# Patient Record
Sex: Female | Born: 1945 | Race: White | Hispanic: No | Marital: Married | Smoking: Current every day smoker
Health system: Southern US, Community
[De-identification: ages and names within clinical notes are randomized; demographics above are authoritative.]

## PROBLEM LIST (undated history)

## (undated) DIAGNOSIS — E785 Hyperlipidemia, unspecified: Secondary | ICD-10-CM

## (undated) DIAGNOSIS — I4891 Unspecified atrial fibrillation: Secondary | ICD-10-CM

## (undated) DIAGNOSIS — G8929 Other chronic pain: Secondary | ICD-10-CM

## (undated) DIAGNOSIS — M419 Scoliosis, unspecified: Secondary | ICD-10-CM

## (undated) DIAGNOSIS — F419 Anxiety disorder, unspecified: Secondary | ICD-10-CM

## (undated) DIAGNOSIS — M1990 Unspecified osteoarthritis, unspecified site: Secondary | ICD-10-CM

## (undated) HISTORY — DX: Anxiety disorder, unspecified: F41.9

## (undated) HISTORY — DX: Unspecified atrial fibrillation: I48.91

## (undated) HISTORY — PX: OTHER SURGICAL HISTORY: SHX169

## (undated) HISTORY — DX: Other chronic pain: G89.29

## (undated) HISTORY — DX: Unspecified osteoarthritis, unspecified site: M19.90

## (undated) HISTORY — DX: Hyperlipidemia, unspecified: E78.5

---

## 1998-04-06 ENCOUNTER — Ambulatory Visit (HOSPITAL_COMMUNITY): Admission: RE | Admit: 1998-04-06 | Discharge: 1998-04-06 | Payer: Self-pay | Admitting: Surgery

## 1998-04-13 ENCOUNTER — Emergency Department (HOSPITAL_COMMUNITY): Admission: EM | Admit: 1998-04-13 | Discharge: 1998-04-13 | Payer: Self-pay | Admitting: Emergency Medicine

## 1999-05-18 ENCOUNTER — Emergency Department (HOSPITAL_COMMUNITY): Admission: EM | Admit: 1999-05-18 | Discharge: 1999-05-18 | Payer: Self-pay | Admitting: Emergency Medicine

## 2000-02-10 ENCOUNTER — Other Ambulatory Visit: Admission: RE | Admit: 2000-02-10 | Discharge: 2000-02-10 | Payer: Self-pay | Admitting: Family Medicine

## 2001-10-25 ENCOUNTER — Other Ambulatory Visit: Admission: RE | Admit: 2001-10-25 | Discharge: 2001-10-25 | Payer: Self-pay | Admitting: Family Medicine

## 2004-04-01 ENCOUNTER — Ambulatory Visit: Payer: Self-pay | Admitting: Family Medicine

## 2004-06-10 ENCOUNTER — Ambulatory Visit: Payer: Self-pay | Admitting: Family Medicine

## 2004-06-10 ENCOUNTER — Other Ambulatory Visit: Admission: RE | Admit: 2004-06-10 | Discharge: 2004-06-10 | Payer: Self-pay | Admitting: Family Medicine

## 2004-08-20 ENCOUNTER — Ambulatory Visit: Payer: Self-pay | Admitting: Family Medicine

## 2004-09-16 ENCOUNTER — Ambulatory Visit: Payer: Self-pay | Admitting: Family Medicine

## 2004-11-12 ENCOUNTER — Ambulatory Visit: Payer: Self-pay | Admitting: Family Medicine

## 2005-01-10 ENCOUNTER — Ambulatory Visit: Payer: Self-pay | Admitting: Family Medicine

## 2005-04-01 ENCOUNTER — Ambulatory Visit: Payer: Self-pay | Admitting: Family Medicine

## 2005-04-08 ENCOUNTER — Ambulatory Visit: Payer: Self-pay | Admitting: Family Medicine

## 2005-05-19 ENCOUNTER — Ambulatory Visit: Payer: Self-pay | Admitting: Family Medicine

## 2005-06-23 ENCOUNTER — Ambulatory Visit: Payer: Self-pay | Admitting: Family Medicine

## 2005-08-12 ENCOUNTER — Ambulatory Visit: Payer: Self-pay | Admitting: Family Medicine

## 2005-09-08 ENCOUNTER — Ambulatory Visit: Payer: Self-pay | Admitting: Family Medicine

## 2005-10-20 ENCOUNTER — Ambulatory Visit: Payer: Self-pay | Admitting: Family Medicine

## 2005-12-15 ENCOUNTER — Ambulatory Visit: Payer: Self-pay | Admitting: Family Medicine

## 2006-04-01 DIAGNOSIS — F410 Panic disorder [episodic paroxysmal anxiety] without agoraphobia: Secondary | ICD-10-CM

## 2006-04-01 DIAGNOSIS — E669 Obesity, unspecified: Secondary | ICD-10-CM | POA: Insufficient documentation

## 2006-04-06 ENCOUNTER — Ambulatory Visit: Payer: Self-pay | Admitting: Family Medicine

## 2006-04-06 LAB — CONVERTED CEMR LAB
ALT: 23 units/L (ref 0–40)
AST: 20 units/L (ref 0–37)
Basophils Relative: 0.9 % (ref 0.0–1.0)
Bilirubin, Direct: 0.1 mg/dL (ref 0.0–0.3)
Calcium: 9.2 mg/dL (ref 8.4–10.5)
Chloride: 106 meq/L (ref 96–112)
Cholesterol: 227 mg/dL (ref 0–200)
Creatinine, Ser: 0.8 mg/dL (ref 0.4–1.2)
Direct LDL: 170.8 mg/dL
Eosinophils Relative: 4.4 % (ref 0.0–5.0)
GFR calc non Af Amer: 78 mL/min
Glucose, Bld: 92 mg/dL (ref 70–99)
HCT: 43.5 % (ref 36.0–46.0)
Hemoglobin: 15.1 g/dL — ABNORMAL HIGH (ref 12.0–15.0)
Lymphocytes Relative: 34.1 % (ref 12.0–46.0)
Monocytes Absolute: 0.5 10*3/uL (ref 0.2–0.7)
Neutro Abs: 2.8 10*3/uL (ref 1.4–7.7)
Neutrophils Relative %: 51.5 % (ref 43.0–77.0)
RDW: 12.8 % (ref 11.5–14.6)
Sodium: 140 meq/L (ref 135–145)
Total Bilirubin: 0.5 mg/dL (ref 0.3–1.2)
Total CHOL/HDL Ratio: 5.4
VLDL: 24 mg/dL (ref 0–40)
WBC: 5.3 10*3/uL (ref 4.5–10.5)

## 2006-04-17 ENCOUNTER — Ambulatory Visit: Payer: Self-pay | Admitting: Family Medicine

## 2006-06-08 ENCOUNTER — Ambulatory Visit: Payer: Self-pay | Admitting: Family Medicine

## 2006-08-17 ENCOUNTER — Ambulatory Visit: Payer: Self-pay | Admitting: Family Medicine

## 2006-08-17 DIAGNOSIS — E785 Hyperlipidemia, unspecified: Secondary | ICD-10-CM

## 2006-08-17 DIAGNOSIS — M4125 Other idiopathic scoliosis, thoracolumbar region: Secondary | ICD-10-CM | POA: Insufficient documentation

## 2006-08-17 DIAGNOSIS — E538 Deficiency of other specified B group vitamins: Secondary | ICD-10-CM | POA: Insufficient documentation

## 2006-08-17 DIAGNOSIS — F411 Generalized anxiety disorder: Secondary | ICD-10-CM | POA: Insufficient documentation

## 2006-08-18 LAB — CONVERTED CEMR LAB
ALT: 29 units/L (ref 0–40)
Alkaline Phosphatase: 67 units/L (ref 39–117)
BUN: 13 mg/dL (ref 6–23)
Bilirubin, Direct: 0.1 mg/dL (ref 0.0–0.3)
CO2: 29 meq/L (ref 19–32)
Calcium: 8.9 mg/dL (ref 8.4–10.5)
Creatinine, Ser: 0.7 mg/dL (ref 0.4–1.2)
GFR calc Af Amer: 110 mL/min
Glucose, Bld: 97 mg/dL (ref 70–99)
Potassium: 4 meq/L (ref 3.5–5.1)
Total Bilirubin: 0.6 mg/dL (ref 0.3–1.2)
Total Protein: 6.7 g/dL (ref 6.0–8.3)

## 2006-08-19 ENCOUNTER — Encounter: Admission: RE | Admit: 2006-08-19 | Discharge: 2006-08-19 | Payer: Self-pay | Admitting: Family Medicine

## 2006-08-24 ENCOUNTER — Encounter: Payer: Self-pay | Admitting: Family Medicine

## 2006-08-24 ENCOUNTER — Encounter (INDEPENDENT_AMBULATORY_CARE_PROVIDER_SITE_OTHER): Payer: Self-pay | Admitting: *Deleted

## 2006-09-01 ENCOUNTER — Telehealth (INDEPENDENT_AMBULATORY_CARE_PROVIDER_SITE_OTHER): Payer: Self-pay | Admitting: *Deleted

## 2006-09-21 ENCOUNTER — Encounter: Payer: Self-pay | Admitting: Family Medicine

## 2006-09-27 ENCOUNTER — Encounter: Payer: Self-pay | Admitting: Family Medicine

## 2006-09-28 ENCOUNTER — Encounter (INDEPENDENT_AMBULATORY_CARE_PROVIDER_SITE_OTHER): Payer: Self-pay | Admitting: *Deleted

## 2006-10-04 ENCOUNTER — Encounter: Payer: Self-pay | Admitting: Family Medicine

## 2006-10-05 ENCOUNTER — Encounter (INDEPENDENT_AMBULATORY_CARE_PROVIDER_SITE_OTHER): Payer: Self-pay | Admitting: *Deleted

## 2006-10-22 ENCOUNTER — Telehealth: Payer: Self-pay | Admitting: Family Medicine

## 2006-11-23 ENCOUNTER — Encounter: Payer: Self-pay | Admitting: Family Medicine

## 2006-11-23 ENCOUNTER — Ambulatory Visit: Payer: Self-pay | Admitting: Family Medicine

## 2006-11-23 ENCOUNTER — Other Ambulatory Visit: Admission: RE | Admit: 2006-11-23 | Discharge: 2006-11-23 | Payer: Self-pay | Admitting: Family Medicine

## 2006-11-23 DIAGNOSIS — Z87891 Personal history of nicotine dependence: Secondary | ICD-10-CM

## 2006-11-26 ENCOUNTER — Encounter (INDEPENDENT_AMBULATORY_CARE_PROVIDER_SITE_OTHER): Payer: Self-pay | Admitting: *Deleted

## 2006-12-01 LAB — CONVERTED CEMR LAB
AST: 20 units/L (ref 0–37)
Albumin: 3.8 g/dL (ref 3.5–5.2)
Basophils Relative: 0.6 % (ref 0.0–1.0)
CO2: 29 meq/L (ref 19–32)
Chloride: 107 meq/L (ref 96–112)
Creatinine, Ser: 0.9 mg/dL (ref 0.4–1.2)
Eosinophils Absolute: 0.2 10*3/uL (ref 0.0–0.6)
Eosinophils Relative: 3.3 % (ref 0.0–5.0)
Glucose, Bld: 100 mg/dL — ABNORMAL HIGH (ref 70–99)
Hemoglobin: 14.8 g/dL (ref 12.0–15.0)
Lymphocytes Relative: 30.4 % (ref 12.0–46.0)
MCV: 90.2 fL (ref 78.0–100.0)
Monocytes Absolute: 0.7 10*3/uL (ref 0.2–0.7)
Neutro Abs: 4 10*3/uL (ref 1.4–7.7)
Neutrophils Relative %: 56.2 % (ref 43.0–77.0)
Sodium: 143 meq/L (ref 135–145)
TSH: 0.85 microintl units/mL (ref 0.35–5.50)
Total Bilirubin: 0.7 mg/dL (ref 0.3–1.2)
Total Protein: 7.1 g/dL (ref 6.0–8.3)
VLDL: 21 mg/dL (ref 0–40)
WBC: 7.1 10*3/uL (ref 4.5–10.5)

## 2007-04-05 ENCOUNTER — Ambulatory Visit: Payer: Self-pay | Admitting: Family Medicine

## 2007-05-24 ENCOUNTER — Ambulatory Visit: Payer: Self-pay | Admitting: Family Medicine

## 2007-05-28 LAB — CONVERTED CEMR LAB
ALT: 19 units/L (ref 0–35)
Alkaline Phosphatase: 63 units/L (ref 39–117)
BUN: 9 mg/dL (ref 6–23)
CO2: 29 meq/L (ref 19–32)
Calcium: 9 mg/dL (ref 8.4–10.5)
Chloride: 108 meq/L (ref 96–112)
GFR calc Af Amer: 94 mL/min
GFR calc non Af Amer: 78 mL/min
Total CHOL/HDL Ratio: 3.5
Total Protein: 7 g/dL (ref 6.0–8.3)

## 2007-06-07 ENCOUNTER — Encounter: Payer: Self-pay | Admitting: Family Medicine

## 2007-06-10 ENCOUNTER — Encounter: Admission: RE | Admit: 2007-06-10 | Discharge: 2007-06-10 | Payer: Self-pay | Admitting: Orthopaedic Surgery

## 2007-06-21 ENCOUNTER — Encounter: Admission: RE | Admit: 2007-06-21 | Discharge: 2007-06-21 | Payer: Self-pay | Admitting: Orthopaedic Surgery

## 2007-07-02 ENCOUNTER — Encounter: Admission: RE | Admit: 2007-07-02 | Discharge: 2007-07-02 | Payer: Self-pay | Admitting: Orthopaedic Surgery

## 2007-08-18 ENCOUNTER — Encounter: Payer: Self-pay | Admitting: Family Medicine

## 2007-09-28 ENCOUNTER — Ambulatory Visit: Payer: Self-pay | Admitting: Family Medicine

## 2007-09-28 DIAGNOSIS — M48061 Spinal stenosis, lumbar region without neurogenic claudication: Secondary | ICD-10-CM | POA: Insufficient documentation

## 2007-10-14 ENCOUNTER — Ambulatory Visit: Payer: Self-pay | Admitting: Family Medicine

## 2007-10-14 DIAGNOSIS — R609 Edema, unspecified: Secondary | ICD-10-CM

## 2007-10-14 DIAGNOSIS — R0609 Other forms of dyspnea: Secondary | ICD-10-CM | POA: Insufficient documentation

## 2007-10-18 ENCOUNTER — Telehealth (INDEPENDENT_AMBULATORY_CARE_PROVIDER_SITE_OTHER): Payer: Self-pay | Admitting: *Deleted

## 2007-10-26 LAB — CONVERTED CEMR LAB
Basophils Absolute: 0 10*3/uL (ref 0.0–0.1)
Bilirubin, Direct: 0.1 mg/dL (ref 0.0–0.3)
Calcium: 8.8 mg/dL (ref 8.4–10.5)
GFR calc Af Amer: 93 mL/min
HCT: 37.2 % (ref 36.0–46.0)
Hemoglobin: 12.9 g/dL (ref 12.0–15.0)
MCHC: 34.8 g/dL (ref 30.0–36.0)
Monocytes Absolute: 0.5 10*3/uL (ref 0.1–1.0)
Neutro Abs: 3 10*3/uL (ref 1.4–7.7)
RDW: 13.2 % (ref 11.5–14.6)
Sodium: 141 meq/L (ref 135–145)
Total Bilirubin: 0.5 mg/dL (ref 0.3–1.2)

## 2007-10-27 ENCOUNTER — Encounter (INDEPENDENT_AMBULATORY_CARE_PROVIDER_SITE_OTHER): Payer: Self-pay | Admitting: *Deleted

## 2007-10-28 ENCOUNTER — Telehealth (INDEPENDENT_AMBULATORY_CARE_PROVIDER_SITE_OTHER): Payer: Self-pay | Admitting: *Deleted

## 2007-11-01 ENCOUNTER — Encounter: Payer: Self-pay | Admitting: Family Medicine

## 2007-11-05 ENCOUNTER — Telehealth (INDEPENDENT_AMBULATORY_CARE_PROVIDER_SITE_OTHER): Payer: Self-pay | Admitting: *Deleted

## 2008-02-07 ENCOUNTER — Other Ambulatory Visit: Admission: RE | Admit: 2008-02-07 | Discharge: 2008-02-07 | Payer: Self-pay | Admitting: Family Medicine

## 2008-02-07 ENCOUNTER — Encounter: Payer: Self-pay | Admitting: Family Medicine

## 2008-02-07 ENCOUNTER — Ambulatory Visit: Payer: Self-pay | Admitting: Family Medicine

## 2008-02-07 DIAGNOSIS — I10 Essential (primary) hypertension: Secondary | ICD-10-CM | POA: Insufficient documentation

## 2008-02-08 ENCOUNTER — Encounter: Payer: Self-pay | Admitting: Family Medicine

## 2008-02-14 ENCOUNTER — Encounter (INDEPENDENT_AMBULATORY_CARE_PROVIDER_SITE_OTHER): Payer: Self-pay | Admitting: *Deleted

## 2008-02-21 LAB — CONVERTED CEMR LAB
ALT: 15 units/L (ref 0–35)
Bilirubin, Direct: 0.1 mg/dL (ref 0.0–0.3)
CO2: 28 meq/L (ref 19–32)
Calcium: 9.3 mg/dL (ref 8.4–10.5)
Eosinophils Absolute: 0.2 10*3/uL (ref 0.0–0.7)
Eosinophils Relative: 2.5 % (ref 0.0–5.0)
Folate: 13 ng/mL
GFR calc Af Amer: 109 mL/min
GFR calc non Af Amer: 90 mL/min
HDL: 40.4 mg/dL (ref >39.0)
MCV: 86.5 fL (ref 78.0–100.0)
Neutrophils Relative %: 59.8 % (ref 43.0–77.0)
Platelets: 160 10*3/uL (ref 150–400)
Sodium: 140 meq/L (ref 135–145)
TSH: 0.83 microintl units/mL (ref 0.35–5.50)
Total Bilirubin: 0.6 mg/dL (ref 0.3–1.2)
Total CHOL/HDL Ratio: 5.3
Triglycerides: 115 mg/dL (ref 0–149)
WBC: 6.5 10*3/uL (ref 4.5–10.5)

## 2008-02-22 ENCOUNTER — Telehealth (INDEPENDENT_AMBULATORY_CARE_PROVIDER_SITE_OTHER): Payer: Self-pay | Admitting: *Deleted

## 2008-02-23 ENCOUNTER — Encounter (INDEPENDENT_AMBULATORY_CARE_PROVIDER_SITE_OTHER): Payer: Self-pay | Admitting: *Deleted

## 2008-03-06 ENCOUNTER — Ambulatory Visit: Payer: Self-pay | Admitting: Family Medicine

## 2008-03-06 DIAGNOSIS — R030 Elevated blood-pressure reading, without diagnosis of hypertension: Secondary | ICD-10-CM | POA: Insufficient documentation

## 2008-03-20 ENCOUNTER — Telehealth: Payer: Self-pay | Admitting: Family Medicine

## 2008-09-11 ENCOUNTER — Ambulatory Visit: Payer: Self-pay | Admitting: Family Medicine

## 2008-09-19 ENCOUNTER — Ambulatory Visit: Payer: Self-pay | Admitting: Family Medicine

## 2008-09-19 DIAGNOSIS — H6120 Impacted cerumen, unspecified ear: Secondary | ICD-10-CM

## 2008-11-21 ENCOUNTER — Ambulatory Visit: Payer: Self-pay | Admitting: Family Medicine

## 2008-11-21 DIAGNOSIS — J209 Acute bronchitis, unspecified: Secondary | ICD-10-CM

## 2008-11-21 DIAGNOSIS — J019 Acute sinusitis, unspecified: Secondary | ICD-10-CM

## 2008-11-23 ENCOUNTER — Telehealth: Payer: Self-pay | Admitting: Family Medicine

## 2008-11-23 LAB — CONVERTED CEMR LAB
BUN: 7 mg/dL (ref 6–23)
Bilirubin, Direct: 0 mg/dL (ref 0.0–0.3)
CO2: 31 meq/L (ref 19–32)
Chloride: 103 meq/L (ref 96–112)
Cholesterol: 156 mg/dL (ref 0–200)
Creatinine, Ser: 0.8 mg/dL (ref 0.4–1.2)
Eosinophils Absolute: 0.1 10*3/uL (ref 0.0–0.7)
Folate: 14.2 ng/mL
Glucose, Bld: 101 mg/dL — ABNORMAL HIGH (ref 70–99)
HCT: 43.1 % (ref 36.0–46.0)
Lymphs Abs: 1.7 10*3/uL (ref 0.7–4.0)
MCHC: 33.7 g/dL (ref 30.0–36.0)
MCV: 89.6 fL (ref 78.0–100.0)
Monocytes Absolute: 0.6 10*3/uL (ref 0.1–1.0)
Neutrophils Relative %: 64.3 % (ref 43.0–77.0)
Platelets: 161 10*3/uL (ref 150.0–400.0)
Total Bilirubin: 0.8 mg/dL (ref 0.3–1.2)
Total Protein: 7.8 g/dL (ref 6.0–8.3)
Triglycerides: 77 mg/dL (ref 0.0–149.0)
Vitamin B-12: 317 pg/mL (ref 211–911)

## 2008-12-18 ENCOUNTER — Ambulatory Visit: Payer: Self-pay | Admitting: Family Medicine

## 2008-12-18 DIAGNOSIS — Z780 Asymptomatic menopausal state: Secondary | ICD-10-CM

## 2008-12-18 DIAGNOSIS — R1013 Epigastric pain: Secondary | ICD-10-CM

## 2008-12-18 DIAGNOSIS — K3189 Other diseases of stomach and duodenum: Secondary | ICD-10-CM

## 2008-12-18 DIAGNOSIS — E876 Hypokalemia: Secondary | ICD-10-CM

## 2008-12-18 LAB — CONVERTED CEMR LAB
ALT: 18 units/L (ref 0–35)
AST: 18 units/L (ref 0–37)
Alkaline Phosphatase: 68 units/L (ref 39–117)
BUN: 13 mg/dL (ref 6–23)
Basophils Absolute: 0 10*3/uL (ref 0.0–0.1)
Chloride: 102 meq/L (ref 96–112)
Eosinophils Absolute: 0.3 10*3/uL (ref 0.0–0.7)
Eosinophils Relative: 5.3 % — ABNORMAL HIGH (ref 0.0–5.0)
GFR calc non Af Amer: 76.93 mL/min (ref >60)
Glucose, Bld: 86 mg/dL (ref 70–99)
H Pylori IgG: NEGATIVE
HCT: 43.7 % (ref 36.0–46.0)
Lymphs Abs: 1.8 10*3/uL (ref 0.7–4.0)
MCHC: 33.7 g/dL (ref 30.0–36.0)
MCV: 89.9 fL (ref 78.0–100.0)
Monocytes Absolute: 0.4 10*3/uL (ref 0.1–1.0)
Neutrophils Relative %: 51.3 % (ref 43.0–77.0)
Platelets: 176 10*3/uL (ref 150.0–400.0)
Potassium: 3.8 meq/L (ref 3.5–5.1)
RDW: 13.3 % (ref 11.5–14.6)
Sodium: 143 meq/L (ref 135–145)
Total Bilirubin: 0.7 mg/dL (ref 0.3–1.2)
WBC: 5 10*3/uL (ref 4.5–10.5)

## 2009-01-10 ENCOUNTER — Telehealth (INDEPENDENT_AMBULATORY_CARE_PROVIDER_SITE_OTHER): Payer: Self-pay | Admitting: *Deleted

## 2009-02-23 ENCOUNTER — Encounter: Payer: Self-pay | Admitting: Family Medicine

## 2009-03-22 ENCOUNTER — Encounter (INDEPENDENT_AMBULATORY_CARE_PROVIDER_SITE_OTHER): Payer: Self-pay | Admitting: Family Medicine

## 2009-03-22 ENCOUNTER — Ambulatory Visit: Payer: Self-pay | Admitting: Vascular Surgery

## 2009-03-22 ENCOUNTER — Ambulatory Visit (HOSPITAL_COMMUNITY): Admission: RE | Admit: 2009-03-22 | Discharge: 2009-03-22 | Payer: Self-pay | Admitting: Family Medicine

## 2009-03-26 ENCOUNTER — Encounter: Admission: RE | Admit: 2009-03-26 | Discharge: 2009-03-26 | Payer: Self-pay | Admitting: Family Medicine

## 2009-07-23 ENCOUNTER — Ambulatory Visit: Payer: Self-pay | Admitting: Family Medicine

## 2009-07-23 ENCOUNTER — Other Ambulatory Visit: Admission: RE | Admit: 2009-07-23 | Discharge: 2009-07-23 | Payer: Self-pay | Admitting: Family Medicine

## 2009-07-23 ENCOUNTER — Encounter (INDEPENDENT_AMBULATORY_CARE_PROVIDER_SITE_OTHER): Payer: Self-pay | Admitting: *Deleted

## 2009-07-23 LAB — CONVERTED CEMR LAB
Blood in Urine, dipstick: NEGATIVE
Glucose, Urine, Semiquant: NEGATIVE
Nitrite: NEGATIVE
Specific Gravity, Urine: <1.005
WBC Urine, dipstick: NEGATIVE
pH: 5

## 2009-07-23 LAB — HM COLONOSCOPY

## 2009-07-23 LAB — HM PAP SMEAR

## 2009-07-25 ENCOUNTER — Encounter: Payer: Self-pay | Admitting: Family Medicine

## 2009-07-25 ENCOUNTER — Encounter (INDEPENDENT_AMBULATORY_CARE_PROVIDER_SITE_OTHER): Payer: Self-pay | Admitting: *Deleted

## 2009-07-27 ENCOUNTER — Telehealth: Payer: Self-pay | Admitting: Family Medicine

## 2009-07-27 LAB — CONVERTED CEMR LAB
ALT: 14 units/L (ref 0–35)
AST: 18 units/L (ref 0–37)
BUN: 11 mg/dL (ref 6–23)
Basophils Relative: 1 % (ref 0.0–3.0)
Bilirubin, Direct: 0.2 mg/dL (ref 0.0–0.3)
Chloride: 104 meq/L (ref 96–112)
Cholesterol: 195 mg/dL (ref 0–200)
Eosinophils Relative: 4.9 % (ref 0.0–5.0)
GFR calc non Af Amer: 92.63 mL/min (ref >60)
HCT: 41.5 % (ref 36.0–46.0)
LDL Cholesterol: 117 mg/dL — ABNORMAL HIGH (ref 0–99)
Lymphs Abs: 2.2 10*3/uL (ref 0.7–4.0)
Monocytes Relative: 8 % (ref 3.0–12.0)
Neutrophils Relative %: 50.8 % (ref 43.0–77.0)
Platelets: 168 10*3/uL (ref 150.0–400.0)
Potassium: 3.9 meq/L (ref 3.5–5.1)
RBC: 4.62 M/uL (ref 3.87–5.11)
Total Bilirubin: 0.8 mg/dL (ref 0.3–1.2)
Total CHOL/HDL Ratio: 4
Total Protein: 6.9 g/dL (ref 6.0–8.3)
VLDL: 31.2 mg/dL (ref 0.0–40.0)
WBC: 6.1 10*3/uL (ref 4.5–10.5)

## 2009-08-01 ENCOUNTER — Encounter: Payer: Self-pay | Admitting: Family Medicine

## 2009-08-17 ENCOUNTER — Telehealth (INDEPENDENT_AMBULATORY_CARE_PROVIDER_SITE_OTHER): Payer: Self-pay | Admitting: *Deleted

## 2009-08-29 ENCOUNTER — Ambulatory Visit: Payer: Self-pay | Admitting: Family Medicine

## 2009-12-17 ENCOUNTER — Ambulatory Visit: Payer: Self-pay | Admitting: Family Medicine

## 2010-04-04 NOTE — Assessment & Plan Note (Signed)
Summary: flu shot//lch  Nurse Visit  CC: Flu shot./kb   Allergies: 1)  ! Cipro 2)  ! Duricef 3)  Penicillin G Potassium (Penicillin G Potassium)  Orders Added: 1)  Admin 1st Vaccine [90471] 2)  Flu Vaccine 4yrs + [[address]]           Flu Vaccine Consent Questions     Do you have a history of severe allergic reactions to this vaccine? no    Any prior history of allergic reactions to egg and/or gelatin? no    Do you have a sensitivity to the preservative Thimersol? no    Do you have a past history of Guillan-Barre Syndrome? no    Do you currently have an acute febrile illness? no    Have you ever had a severe reaction to latex? no    Vaccine information given and explained to patient? yes    Are you currently pregnant? no    Lot Number:[id number]   Exp Date:08/31/2010   Site Given  Left Deltoid IMu

## 2010-04-04 NOTE — Assessment & Plan Note (Signed)
Summary: cpx//lch   Vital Signs:  Patient profile:   65 year old female Height:      66 inches Weight:      197 pounds BMI:     31.91 Pulse rate:   82 / minute Pulse rhythm:   regular BP sitting:   126 / 78  (left arm) Cuff size:   large  Vitals Entered By: Army Fossa CMA (Jul 23, 2009 9:06 AM) CC: Pt here for CPX, pt states she never got the Klor- Con.   History of Present Illness: Pt here for cpe and pap with labs.  No complaints.    Hyperlipidemia follow-up      This is a 65 year old woman who presents for Hyperlipidemia follow-up.  The patient complains of fatigue, but denies muscle aches, GI upset, abdominal pain, flushing, itching, constipation, and diarrhea.  The patient denies the following symptoms: chest pain/pressure, exercise intolerance, dypsnea, palpitations, syncope, and pedal edema.  Compliance with medications (by patient report) has been near 100%.  Dietary compliance has been good.  The patient reports no exercise.  Adjunctive measures currently used by the patient include fiber, ASA, limiting alcohol consumpton, and weight reduction.    Hypertension follow-up      The patient also presents for Hypertension follow-up.  The patient denies lightheadedness, urinary frequency, headaches, edema, impotence, rash, and fatigue.  The patient denies the following associated symptoms: chest pain, chest pressure, exercise intolerance, dyspnea, palpitations, syncope, leg edema, and pedal edema.  Compliance with medications (by patient report) has been near 100%.  The patient reports that dietary compliance has been good.  The patient reports no exercise.  Adjunctive measures currently used by the patient include salt restriction.    Preventive Screening-Counseling & Management  Alcohol-Tobacco     Alcohol drinks/day: 0     Smoking Status: current     Smoking Cessation Counseling: yes     Smoke Cessation Stage: ready     Packs/Day: 1.0     Year Started: 1968     Passive  Smoke Exposure: no  Caffeine-Diet-Exercise     Caffeine use/day: 5+     Does Patient Exercise: no     Exercise Counseling: to improve exercise regimen  Hep-HIV-STD-Contraception     HIV Risk: no     Dental Visit-last 6 months no     Dental Care Counseling: dentures     SBE monthly: yes     SBE Education/Counseling: not indicated; SBE done regularly  Safety-Violence-Falls     Seat Belt Use: 100      Sexual History:  currently monogamous.        Drug Use:  never.    Current Medications (verified): 1)  Lexapro 20 Mg  Tabs (Escitalopram Oxalate) .... Once Daily 2)  Ms Contin 15 Mg  Xr12h-Tab (Morphine Sulfate) .... Take One Tablet By Mouth Every 8 Hrs For Chronic Pain **pain Clinic Rx"s ****** 3)  Furosemide 40 Mg  Tabs (Furosemide) .Marland Kitchen.. 1 By Mouth Once Daily 4)  Xanax 0.25 Mg Tabs (Alprazolam) .Marland Kitchen.. 1 By Mouth Three Times A Day As Needed 5)  Pravastatin Sodium 40 Mg Tabs (Pravastatin Sodium) .... Take 1 Tab At Bedtime 6)  Nasonex 50 Mcg/act Susp (Mometasone Furoate) .... 2 Sprays Each Nostril Once Daily 7)  Gabapentin 100 Mg Caps (Gabapentin) 8)  Klor-Con 20 Meq Pack (Potassium Chloride) .Marland Kitchen.. 1 By Mouth Once Daily. *recheck Labs in 1 Month* 9)  Meloxicam 15 Mg Tabs (Meloxicam) .Marland Kitchen.. 1 By Mouth Daily  Allergies: 1)  ! Cipro 2)  ! Duricef 3)  Penicillin G Potassium (Penicillin G Potassium)  Past History:  Past Surgical History: Last updated: 08/17/2006 Denies surgical history  Family History: Last updated: 11/23/2006 M-- pneumonia F-Jaw CA  Social History: Last updated: 02/07/2008 Married Current Smoker Alcohol use-no Drug use-no Occupation:  Futures trader Regular exercise-no  Risk Factors: Alcohol Use: 0 (07/23/2009) Caffeine Use: 5+ (07/23/2009) Exercise: no (07/23/2009)  Risk Factors: Smoking Status: current (07/23/2009) Packs/Day: 1.0 (07/23/2009) Passive Smoke Exposure: no (07/23/2009)  Past Medical History: Reviewed history from 02/07/2008 and no  changes required. Anxiety Hyperlipidemia + TOB Hypertension  Family History: Reviewed history from 11/23/2006 and no changes required. M-- pneumonia F-Jaw CA  Social History: Reviewed history from 02/07/2008 and no changes required. Married Current Smoker Alcohol use-no Drug use-no Occupation:  Futures trader Regular exercise-no Dental Care w/in 6 mos.:  no Sexual History:  currently monogamous Drug Use:  never  Review of Systems      See HPI General:  Denies chills, fatigue, fever, loss of appetite, malaise, sleep disorder, sweats, weakness, and weight loss. Eyes:  Denies blurring, discharge, double vision, eye irritation, eye pain, halos, itching, light sensitivity, red eye, vision loss-1 eye, and vision loss-both eyes; optho-- due. ENT:  Denies decreased hearing, difficulty swallowing, ear discharge, earache, hoarseness, nasal congestion, nosebleeds, postnasal drainage, ringing in ears, sinus pressure, and sore throat. CV:  Denies bluish discoloration of lips or nails, chest pain or discomfort, difficulty breathing at night, difficulty breathing while lying down, fainting, fatigue, leg cramps with exertion, lightheadness, near fainting, palpitations, shortness of breath with exertion, swelling of feet, swelling of hands, and weight gain. Resp:  Denies chest discomfort, chest pain with inspiration, cough, coughing up blood, excessive snoring, hypersomnolence, morning headaches, pleuritic, shortness of breath, sputum productive, and wheezing. GI:  Denies abdominal pain, bloody stools, change in bowel habits, constipation, dark tarry stools, diarrhea, excessive appetite, gas, hemorrhoids, and indigestion. GU:  Denies abnormal vaginal bleeding, decreased libido, discharge, dysuria, genital sores, hematuria, incontinence, nocturia, urinary frequency, and urinary hesitancy. MS:  Complains of joint pain and low back pain; denies joint redness, joint swelling, loss of strength, mid back pain,  muscle aches, muscle , cramps, muscle weakness, stiffness, and thoracic pain; back and knee pain--sees ortho. Derm:  Denies changes in color of skin, changes in nail beds, dryness, excessive perspiration, flushing, hair loss, insect bite(s), itching, lesion(s), poor wound healing, and rash. Neuro:  Denies brief paralysis, difficulty with concentration, disturbances in coordination, falling down, headaches, inability to speak, memory loss, numbness, poor balance, seizures, sensation of room spinning, tingling, tremors, visual disturbances, and weakness. Psych:  Denies alternate hallucination ( auditory/visual), anxiety, depression, easily angered, easily tearful, irritability, mental problems, panic attacks, sense of great danger, suicidal thoughts/plans, thoughts of violence, unusual visions or sounds, and thoughts /plans of harming others. Endo:  Denies cold intolerance, excessive hunger, excessive thirst, excessive urination, heat intolerance, polyuria, and weight change. Heme:  Denies abnormal bruising, bleeding, enlarge lymph nodes, fevers, pallor, and skin discoloration. Allergy:  Denies hives or rash, itching eyes, persistent infections, seasonal allergies, and sneezing.  Physical Exam  General:  Well-developed,well-nourished,in no acute distress; alert,appropriate and cooperative throughout examination Head:  Normocephalic and atraumatic without obvious abnormalities. No apparent alopecia or balding. Eyes:  vision grossly intact, pupils equal, pupils round, pupils reactive to light, and no injection.   Ears:  External ear exam shows no significant lesions or deformities.  Otoscopic examination reveals clear canals, tympanic membranes are intact bilaterally without  bulging, retraction, inflammation or discharge. Hearing is grossly normal bilaterally. Nose:  External nasal examination shows no deformity or inflammation. Nasal mucosa are pink and moist without lesions or exudates. Mouth:  Oral  mucosa and oropharynx without lesions or exudates.  Teeth in good repair. Neck:  No deformities, masses, or tenderness noted.no carotid bruits.   Chest Wall:  No deformities, masses, or tenderness noted. Breasts:  No mass, nodules, thickening, tenderness, bulging, retraction, inflamation, nipple discharge or skin changes noted.   Lungs:  Normal respiratory effort, chest expands symmetrically. Lungs are clear to auscultation, no crackles or wheezes. Heart:  normal rate and no murmur.   Abdomen:  Bowel sounds positive,abdomen soft and non-tender without masses, organomegaly or hernias noted. Rectal:  No external abnormalities noted. Normal sphincter tone. No rectal masses or tenderness. Genitalia:  Pelvic Exam:        External: normal female genitalia without lesions or masses        Vagina: normal without lesions or masses        Cervix: normal without lesions or masses        Adnexa: normal bimanual exam without masses or fullness        Uterus: normal by palpation        Pap smear: performed Msk:  no joint warmth, no redness over joints, no joint instability, and no crepitation.   + scoliosis Pulses:  R posterior tibial normal, R dorsalis pedis normal, R carotid normal, L posterior tibial normal, L dorsalis pedis normal, and L carotid normal.   Extremities:  No clubbing, cyanosis, edema, or deformity noted with normal full range of motion of all joints.   Neurologic:  No cranial nerve deficits noted. Station and gait are normal. Plantar reflexes are down-going bilaterally. DTRs are symmetrical throughout. Sensory, motor and coordinative functions appear intact. Skin:  Intact without suspicious lesions or rashes Cervical Nodes:  No lymphadenopathy noted Axillary Nodes:  No palpable lymphadenopathy Psych:  Cognition and judgment appear intact. Alert and cooperative with normal attention span and concentration. No apparent delusions, illusions, hallucinations   Impression &  Recommendations:  Problem # 1:  PREVENTIVE HEALTH CARE (ICD-V70.0) GHM utd Orders: Radiology Referral (Radiology) Venipuncture [phone]) TLB-B12 + Folate Pnl (96295_28413-K44/WNU) TLB-Lipid Panel (80061-LIPID) TLB-BMP (Basic Metabolic Panel-BMET) (80048-METABOL) TLB-CBC Platelet - w/Differential (85025-CBCD) TLB-Hepatic/Liver Function Pnl (80076-HEPATIC) TLB-TSH (Thyroid Stimulating Hormone) (84443-TSH) T-Vitamin D (25-Hydroxy) (27253-66440)  Problem # 2:  POSTMENOPAUSAL STATUS (ICD-V49.81)  Orders: Radiology Referral (Radiology) Venipuncture (34742) TLB-B12 + Folate Pnl (59563_87564-P32/RJJ) TLB-Lipid Panel (80061-LIPID) TLB-BMP (Basic Metabolic Panel-BMET) (80048-METABOL) TLB-CBC Platelet - w/Differential (85025-CBCD) TLB-Hepatic/Liver Function Pnl (80076-HEPATIC) TLB-TSH (Thyroid Stimulating Hormone) (84443-TSH) T-Vitamin D (25-Hydroxy) (88416-60630) UA Dipstick w/o Micro (manual) (16[address])  Problem # 3:  HYPOKALEMIA (ICD-276.8)  Problem # 4:  HYPERTENSION (ICD-401.9)  Her updated medication list for this problem includes:    Furosemide 40 Mg Tabs (Furosemide) .Marland Kitchen... 1 by mouth once daily  Orders: Venipuncture (93235) TLB-B12 + Folate Pnl (57322_02542-H06/CBJ) TLB-Lipid Panel (80061-LIPID) TLB-BMP (Basic Metabolic Panel-BMET) (80048-METABOL) TLB-CBC Platelet - w/Differential (85025-CBCD) TLB-Hepatic/Liver Function Pnl (80076-HEPATIC) TLB-TSH (Thyroid Stimulating Hormone) (84443-TSH) T-Vitamin D (25-Hydroxy) (62831-51761)  BP today: 126/78 Prior BP: 130/82 (12/18/2008)  Labs Reviewed: K+: 3.8 (12/18/2008) Creat: : 0.8 (12/18/2008)   Chol: 156 (11/21/2008)   HDL: 44.60 (11/21/2008)   LDL: 96 (11/21/2008)   TG: 77.0 (11/21/2008)  Problem # 5:  B12 DEFICIENCY (ICD-266.2)  Orders: Venipuncture (60737) TLB-B12 + Folate Pnl (10626_94854-O27/OJJ) TLB-Lipid Panel (80061-LIPID) TLB-BMP (Basic Metabolic Panel-BMET) (80048-METABOL) TLB-CBC Platelet - w/Differential  (  85025-CBCD) TLB-Hepatic/Liver Function Pnl (80076-HEPATIC) TLB-TSH (Thyroid Stimulating Hormone) (84443-TSH) T-Vitamin D (25-Hydroxy) ([address]-60[address])  Problem # 6:  HYPERLIPIDEMIA (ICD-272.4)  Her updated medication list for this problem includes:    Pravastatin Sodium 40 Mg Tabs (Pravastatin sodium) .Marland Kitchen... Take 1 tab at bedtime  Orders: Venipuncture (09811) TLB-B12 + Folate Pnl (91478_29562-Z30/QMV) TLB-Lipid Panel (80061-LIPID) TLB-BMP (Basic Metabolic Panel-BMET) (80048-METABOL) TLB-CBC Platelet - w/Differential (85025-CBCD) TLB-Hepatic/Liver Function Pnl (80076-HEPATIC) TLB-TSH (Thyroid Stimulating Hormone) (84443-TSH) T-Vitamin D (25-Hydroxy) (78469-62952)  Labs Reviewed: SGOT: 18 (12/18/2008)   SGPT: 18 (12/18/2008)   HDL:44.60 (11/21/2008), 40.4 (02/07/2008)  LDL:96 (11/21/2008), DEL (84/13/2440)  Chol:156 (11/21/2008), 216 (02/07/2008)  Trig:77.0 (11/21/2008), 115 (02/07/2008)  Complete Medication List: 1)  Lexapro 20 Mg Tabs (Escitalopram oxalate) .... Once daily 2)  Ms Contin 15 Mg Xr12h-tab (Morphine sulfate) .... Take one tablet by mouth every 8 hrs for chronic pain **pain clinic rx"s ****** 3)  Furosemide 40 Mg Tabs (Furosemide) .Marland Kitchen.. 1 by mouth once daily 4)  Xanax 0.25 Mg Tabs (Alprazolam) .Marland Kitchen.. 1 by mouth three times a day as needed 5)  Pravastatin Sodium 40 Mg Tabs (Pravastatin sodium) .... Take 1 tab at bedtime 6)  Nasonex 50 Mcg/act Susp (Mometasone furoate) .... 2 sprays each nostril once daily 7)  Gabapentin 100 Mg Caps (Gabapentin) 8)  Klor-con 20 Meq Pack (Potassium chloride) .Marland Kitchen.. 1 by mouth once daily. *recheck labs in 1 month* 9)  Meloxicam 15 Mg Tabs (Meloxicam) .Marland Kitchen.. 1 by mouth daily Prescriptions: XANAX 0.25 MG TABS (ALPRAZOLAM) 1 by mouth three times a day as needed  #30 x 0   Entered and Authorized by:   Loreen Freud DO   Signed by:   Loreen Freud DO on 07/23/2009   Method used:   Print then Give to Patient   RxID:   [id number] NASONEX 50 MCG/ACT  SUSP (MOMETASONE FUROATE) 2 sprays each nostril once daily  #1 x 11   Entered and Authorized by:   Loreen Freud DO   Signed by:   Loreen Freud DO on 07/23/2009   Method used:   Electronically to        [id number]* (retail)       [address]       [place], [place]  [address]       Ph: [phone] or [phone]       Fax: [phone]   RxID:   [id number]      Colonoscopy Next Due:  Refused  Laboratory Results   Urine Tests   Date/Time Reported: Jul 23, 2009 10:50 AM   Routine Urinalysis   Color: lt. yellow Appearance: Clear Glucose: negative   (Normal Range: Negative) Bilirubin: negative   (Normal Range: Negative) Ketone: negative   (Normal Range: Negative) Spec. Gravity: <1.005   (Normal Range: 1.003-1.035) Blood: negative   (Normal Range: Negative) pH: 5.0   (Normal Range: 5.0-8.0) Protein: negative   (Normal Range: Negative) Urobilinogen: 0.2   (Normal Range: 0-1) Nitrite: negative   (Normal Range: Negative) Leukocyte Esterace: negative   (Normal Range: Negative)    Comments: Floydene Flock  Jul 23, 2009 10:50 AM

## 2010-04-04 NOTE — Letter (Signed)
Summary: Cancer Screening/[facility] Personalized Risk Profile  Cancer Screening/[facility] Personalized Risk Profile   Imported By: Lanelle Bal 08/01/2009 08:48:31  _____________________________________________________________________  External Attachment:    Type:   Image     Comment:   External Document

## 2010-04-04 NOTE — Progress Notes (Signed)
Summary: Results  Phone Note Outgoing Call   Call placed by: Army Fossa CMA,  August 17, 2009 9:11 AM Reason for Call: Discuss lab or test results Summary of Call: Regarding lab results, LMTCB:  osteopenic---- make sure pt is taking 1500mg  calicum with 1000u vita D--- if she is we need to add medication---fosamax 70 mg weekly  #4  11 refills----recheck 2 years Signed by Loreen Freud DO on 08/16/2009 at 9:44 PM   Follow-up for Phone Call        Pt was not taking 1500 mg of calicum. Will start taking this. Army Fossa CMA  August 21, 2009 9:28 AM     New/Updated Medications: CALCIUM 1500 MG TABS (CALCIUM CARBONATE) 1 by mouth daily VITAMIN D 1000 UNIT TABS (CHOLECALCIFEROL) 1 by mouth daily

## 2010-04-04 NOTE — Letter (Signed)
Summary: [facility]: Immunoassay Fecal Occult Blood (iFOB) Order Form  [facility] [place]  [address] [place], [place] [address]   Phone: [phone]  Fax: [phone]      [facility]: Immunoassay Fecal Occult Blood (iFOB) Order Form   Jul 23, 2009 MRN: [id number]   [facility] [place] November 14, 1945   Physicican Name:____yvonne lowne,do_____________________  Diagnosis Code:_____v76.51_____________________      Army Fossa CMA

## 2010-04-04 NOTE — Progress Notes (Signed)
Summary: Lab Results   Phone Note Outgoing Call   Call placed by: Army Fossa CMA,  Jul 27, 2009 1:06 PM Reason for Call: Discuss lab or test results Summary of Call: Regarding lab results, LMTCB:  Ideally your LDL (bad cholesterol) should be <__100__, your HDL (good cholesterol) should be >__40_ and your triglycerides should be< 150.  Diet and exercise will increase HDL and decrease the LDL and triglycerides. Read Dr. Danice Goltz book--Eat Drink and Be Healthy.   Change to Lipitor 20 mg #30 -1po at bedtime, 2 refills---with $4 copay card.  We will recheck labs in _3__ months.   lipid, hep 272.4 Signed by Loreen Freud DO on 07/27/2009 at 12:10 PM   Follow-up for Phone Call        Pt would like to know if she can do Crestor. Please advise. Army Fossa CMA  Jul 31, 2009 10:39 AM   Additional Follow-up for Phone Call Additional follow up Details #1::        yes she can---I just chose lipitor because it would be cheaper-----crestor 10 mg  #30  1 by mouth at bedtime 2 refills Additional Follow-up by: Loreen Freud DO,  Jul 31, 2009 11:57 AM    Additional Follow-up for Phone Call Additional follow up Details #2::    Pt is aware, sent in crestor. Army Fossa CMA  Jul 31, 2009 11:58 AM   New/Updated Medications: CRESTOR 10 MG TABS (ROSUVASTATIN CALCIUM) 1 by mouth at bedtime. Prescriptions: CRESTOR 10 MG TABS (ROSUVASTATIN CALCIUM) 1 by mouth at bedtime.  #30 x 2   Entered by:   Army Fossa CMA   Authorized by:   Loreen Freud DO   Signed by:   Army Fossa CMA on 07/31/2009   Method used:   Electronically to        [id number]* (retail)       [address]       [place], [place]  [address]       Ph: [phone] or [phone]       Fax: [phone]   RxID:   [id number]

## 2010-04-04 NOTE — Letter (Signed)
Summary: Results Follow up Letter  [facility] [place]  [address] [place], [place] [address]   Phone: [phone]  Fax: [phone]    07/25/2009 MRN: [id number]  [facility] [address] [place], [place]  [address]  Dear Ms. Joann Ford,  The following are the results of your recent test(s):  Test         Result    Pap Smear:        Normal __X___  Not Normal _____ Comments: ______________________________________________________ Cholesterol: LDL(Bad cholesterol):         Your goal is less than:         HDL (Good cholesterol):       Your goal is more than: Comments:  ______________________________________________________ Mammogram:        Normal _____  Not Normal _____ Comments:  ___________________________________________________________________ Hemoccult:        Normal _____  Not normal _______ Comments:    _____________________________________________________________________ Other Tests:    We routinely do not discuss normal results over the telephone.  If you desire a copy of the results, or you have any questions about this information we can discuss them at your next office visit.   Sincerely,    Army Fossa CMA  Jul 25, 2009 2:20 PM

## 2010-07-30 ENCOUNTER — Other Ambulatory Visit: Payer: Self-pay | Admitting: Family Medicine

## 2010-07-31 MED ORDER — ESCITALOPRAM OXALATE 20 MG PO TABS: 20.0000 mg | ORAL_TABLET | Freq: Every day | ORAL | Status: DC

## 2010-07-31 NOTE — Telephone Encounter (Signed)
Last seen 5.23.11 for CPX and filled 10/01/09 with 4 refills...   CPX scheduled for 08/26/10  PLease advise    KP

## 2010-08-22 ENCOUNTER — Encounter: Payer: Self-pay | Admitting: Family Medicine

## 2010-08-26 ENCOUNTER — Ambulatory Visit (INDEPENDENT_AMBULATORY_CARE_PROVIDER_SITE_OTHER): Payer: BC Managed Care – PPO | Admitting: Family Medicine

## 2010-08-26 ENCOUNTER — Encounter: Payer: Self-pay | Admitting: Family Medicine

## 2010-08-26 VITALS — BP 124/82 | HR 73 | Temp 98.9°F | Ht 65.25 in | Wt 189.2 lb

## 2010-08-26 DIAGNOSIS — E785 Hyperlipidemia, unspecified: Secondary | ICD-10-CM

## 2010-08-26 DIAGNOSIS — Z0000 Encounter for general adult medical examination without abnormal findings: Secondary | ICD-10-CM

## 2010-08-26 DIAGNOSIS — Z2911 Encounter for prophylactic immunotherapy for respiratory syncytial virus (RSV): Secondary | ICD-10-CM

## 2010-08-26 DIAGNOSIS — R609 Edema, unspecified: Secondary | ICD-10-CM

## 2010-08-26 DIAGNOSIS — J339 Nasal polyp, unspecified: Secondary | ICD-10-CM

## 2010-08-26 DIAGNOSIS — F329 Major depressive disorder, single episode, unspecified: Secondary | ICD-10-CM

## 2010-08-26 LAB — BASIC METABOLIC PANEL
CO2: 28 mEq/L (ref 19–32)
Calcium: 9 mg/dL (ref 8.4–10.5)
Creatinine, Ser: 0.9 mg/dL (ref 0.4–1.2)
GFR: 65.13 mL/min (ref >60.00)
Sodium: 140 mEq/L (ref 135–145)

## 2010-08-26 LAB — CBC WITH DIFFERENTIAL/PLATELET
Basophils Relative: 0.4 % (ref 0.0–3.0)
Eosinophils Absolute: 0.1 10*3/uL (ref 0.0–0.7)
Eosinophils Relative: 2 % (ref 0.0–5.0)
Hemoglobin: 14.6 g/dL (ref 12.0–15.0)
Lymphocytes Relative: 21 % (ref 12.0–46.0)
MCHC: 34.3 g/dL (ref 30.0–36.0)
Monocytes Relative: 6.4 % (ref 3.0–12.0)
Neutro Abs: 4.1 10*3/uL (ref 1.4–7.7)
Neutrophils Relative %: 70.2 % (ref 43.0–77.0)
RBC: 4.68 Mil/uL (ref 3.87–5.11)
WBC: 5.8 10*3/uL (ref 4.5–10.5)

## 2010-08-26 LAB — LIPID PANEL
HDL: 50.8 mg/dL (ref >39.00)
Total CHOL/HDL Ratio: 3
VLDL: 22 mg/dL (ref 0.0–40.0)

## 2010-08-26 LAB — HEPATIC FUNCTION PANEL
AST: 18 U/L (ref 0–37)
Albumin: 4.1 g/dL (ref 3.5–5.2)
Alkaline Phosphatase: 65 U/L (ref 39–117)
Bilirubin, Direct: 0.1 mg/dL (ref 0.0–0.3)
Total Protein: 6.7 g/dL (ref 6.0–8.3)

## 2010-08-26 LAB — POCT URINALYSIS DIPSTICK
Bilirubin, UA: NEGATIVE
Leukocytes, UA: NEGATIVE
Nitrite, UA: NEGATIVE
Protein, UA: NEGATIVE
pH, UA: 5

## 2010-08-26 LAB — TSH: TSH: 0.93 u[IU]/mL (ref 0.35–5.50)

## 2010-08-26 MED ORDER — FUROSEMIDE 40 MG PO TABS: 40.0000 mg | ORAL_TABLET | Freq: Every day | ORAL | Status: DC

## 2010-08-26 MED ORDER — ROSUVASTATIN CALCIUM 10 MG PO TABS: 10.0000 mg | ORAL_TABLET | Freq: Every day | ORAL | Status: DC

## 2010-08-26 MED ORDER — MOMETASONE FUROATE 50 MCG/ACT NA SUSP: 2.0000 | Freq: Every day | NASAL | Status: DC

## 2010-08-26 MED ORDER — ESCITALOPRAM OXALATE 20 MG PO TABS: 20.0000 mg | ORAL_TABLET | Freq: Every day | ORAL | Status: DC

## 2010-08-26 MED ORDER — ALPRAZOLAM 0.25 MG PO TABS: 0.2500 mg | ORAL_TABLET | Freq: Three times a day (TID) | ORAL | Status: DC

## 2010-08-26 NOTE — Patient Instructions (Signed)

## 2010-08-26 NOTE — Progress Notes (Signed)
  Subjective:     Joann Ford is a 65 y.o. female and is here for a comprehensive physical exam. The patient reports no problems.  History   Social History  . Marital Status: Married    Spouse Name: N/A    Number of Children: N/A  . Years of Education: N/A   Occupational History  . hair dresser    Social History Main Topics  . Smoking status: Current Everyday Smoker -- 0.5 packs/day for 40 years    Types: Cigarettes  . Smokeless tobacco: Never Used  . Alcohol Use: No  . Drug Use: No  . Sexually Active: Yes -- Female partner(s)   Other Topics Concern  . Not on file   Social History Narrative  . No narrative on file   Health Maintenance  Topic Date Due  . Zostavax  09/12/2005  . Mammogram  09/20/2008  . Influenza Vaccine  12/02/2010  . Tetanus/tdap  10/26/2011  . Pap Smear  07/23/2012  . Colonoscopy  07/24/2019    The following portions of the patient's history were reviewed and updated as appropriate: allergies, current medications, past family history, past medical history, past social history, past surgical history and problem list.  Review of Systems Review of Systems  Constitutional: Negative for activity change, appetite change and fatigue.  HENT: Negative for hearing loss, congestion, tinnitus and ear discharge.  Dentist---  dentures Eyes: Negative for visual disturbance (optho-- 4-5 years ago). Respiratory: Negative for cough, chest tightness and shortness of breath.   Cardiovascular: Negative for chest pain, palpitations and leg swelling.  Gastrointestinal: Negative for abdominal pain, diarrhea, constipation and abdominal distention.  Genitourinary: Negative for urgency, frequency, decreased urine volume and difficulty urinating.  Musculoskeletal: Negative for back pain, arthralgias and gait problem.  Skin: Negative for color change, pallor and rash.  Neurological: Negative for dizziness, light-headedness, numbness and headaches.  Hematological: Negative for  adenopathy. Does not bruise/bleed easily.  Psychiatric/Behavioral: Negative for suicidal ideas, confusion, sleep disturbance, self-injury, dysphoric mood, decreased concentration and agitation.       Objective:    BP 124/82  Pulse 73  Temp(Src) 98.9 F (37.2 C) (Oral)  Ht 5' 5.25" (1.657 m)  Wt 189 lb 3.2 oz (85.821 kg)  BMI 31.24 kg/m2  SpO2 97% General appearance: alert, cooperative, appears stated age and no distress Head: Normocephalic, without obvious abnormality, atraumatic Eyes: conjunctivae/corneas clear. PERRL, EOM's intact. Fundi benign. Ears: normal TM's and external ear canals both ears Nose: Nares normal. Septum midline. Mucosa normal. No drainage or sinus tenderness. Throat: lips, mucosa, and tongue normal; teeth and gums normal Neck: no adenopathy, no carotid bruit, no JVD, supple, symmetrical, trachea midline and thyroid not enlarged, symmetric, no tenderness/mass/nodules Lungs: clear to auscultation bilaterally Breasts: normal appearance, no masses or tenderness Heart: S1S2   + murmur Abdomen: soft, non-tender; bowel sounds normal; no masses,  no organomegaly Pelvic: refused Extremities: extremities normal, atraumatic, no cyanosis or edema Pulses: 2+ and symmetric Skin: Skin color, texture, turgor normal. No rashes or lesions Lymph nodes: Cervical, supraclavicular, and axillary nodes normal. Neurologic: Grossly normal psych--  no depression, anxiety or suicidal ideation    Assessment:    Healthy female exam.    Plan:     See After Visit Summary for Counseling Recommendations

## 2010-09-16 ENCOUNTER — Encounter: Payer: Self-pay | Admitting: Internal Medicine

## 2010-11-05 ENCOUNTER — Encounter: Payer: Self-pay | Admitting: Internal Medicine

## 2010-11-05 ENCOUNTER — Ambulatory Visit (AMBULATORY_SURGERY_CENTER): Payer: Medicare Other | Admitting: *Deleted

## 2010-11-05 VITALS — Ht 69.0 in | Wt 190.4 lb

## 2010-11-05 DIAGNOSIS — Z1211 Encounter for screening for malignant neoplasm of colon: Secondary | ICD-10-CM

## 2010-11-05 MED ORDER — SUPREP BOWEL PREP KIT 17.5-3.13-1.6 GM/177ML PO SOLN: 1.0000 | Freq: Once | ORAL | Status: DC

## 2010-11-19 ENCOUNTER — Other Ambulatory Visit: Payer: BC Managed Care – PPO | Admitting: Internal Medicine

## 2010-11-19 ENCOUNTER — Encounter: Payer: Self-pay | Admitting: Internal Medicine

## 2010-11-19 ENCOUNTER — Ambulatory Visit (AMBULATORY_SURGERY_CENTER): Payer: Medicare Other | Admitting: Internal Medicine

## 2010-11-19 VITALS — BP 150/83 | HR 81 | Temp 98.9°F | Resp 18 | Ht 69.0 in | Wt 190.0 lb

## 2010-11-19 DIAGNOSIS — Z1211 Encounter for screening for malignant neoplasm of colon: Secondary | ICD-10-CM

## 2010-11-19 DIAGNOSIS — D126 Benign neoplasm of colon, unspecified: Secondary | ICD-10-CM

## 2010-11-19 MED ORDER — SODIUM CHLORIDE 0.9 % IV SOLN: 500.0000 mL | INTRAVENOUS | Status: DC

## 2010-11-19 NOTE — Progress Notes (Signed)
From 8:10 until 11:00 [employer] computer system wide down. [employer] wireless system down rest of day. Downtime procedure policy followed.  Information entered by Laverna Peace RN.  Room RN Durwin Glaze, and Recovery Room RN Darlyn Read RN

## 2010-11-20 ENCOUNTER — Telehealth: Payer: Self-pay

## 2010-11-20 NOTE — Telephone Encounter (Signed)

## 2010-11-27 ENCOUNTER — Encounter: Payer: Self-pay | Admitting: Internal Medicine

## 2011-08-06 ENCOUNTER — Other Ambulatory Visit: Payer: Self-pay | Admitting: Family Medicine

## 2011-09-16 ENCOUNTER — Other Ambulatory Visit: Payer: Self-pay | Admitting: Family Medicine

## 2011-09-29 ENCOUNTER — Encounter: Payer: Self-pay | Admitting: Family Medicine

## 2011-09-29 ENCOUNTER — Ambulatory Visit (INDEPENDENT_AMBULATORY_CARE_PROVIDER_SITE_OTHER): Payer: Medicare Other | Admitting: Family Medicine

## 2011-09-29 VITALS — BP 120/70 | HR 76 | Temp 99.2°F | Ht 65.5 in | Wt 193.6 lb

## 2011-09-29 DIAGNOSIS — R319 Hematuria, unspecified: Secondary | ICD-10-CM

## 2011-09-29 DIAGNOSIS — Z0000 Encounter for general adult medical examination without abnormal findings: Secondary | ICD-10-CM

## 2011-09-29 DIAGNOSIS — F410 Panic disorder [episodic paroxysmal anxiety] without agoraphobia: Secondary | ICD-10-CM

## 2011-09-29 DIAGNOSIS — E785 Hyperlipidemia, unspecified: Secondary | ICD-10-CM

## 2011-09-29 DIAGNOSIS — Z23 Encounter for immunization: Secondary | ICD-10-CM

## 2011-09-29 DIAGNOSIS — Z1239 Encounter for other screening for malignant neoplasm of breast: Secondary | ICD-10-CM

## 2011-09-29 DIAGNOSIS — F411 Generalized anxiety disorder: Secondary | ICD-10-CM

## 2011-09-29 DIAGNOSIS — Z780 Asymptomatic menopausal state: Secondary | ICD-10-CM

## 2011-09-29 DIAGNOSIS — J339 Nasal polyp, unspecified: Secondary | ICD-10-CM

## 2011-09-29 DIAGNOSIS — F419 Anxiety disorder, unspecified: Secondary | ICD-10-CM

## 2011-09-29 DIAGNOSIS — F329 Major depressive disorder, single episode, unspecified: Secondary | ICD-10-CM

## 2011-09-29 DIAGNOSIS — I10 Essential (primary) hypertension: Secondary | ICD-10-CM

## 2011-09-29 DIAGNOSIS — M48061 Spinal stenosis, lumbar region without neurogenic claudication: Secondary | ICD-10-CM

## 2011-09-29 LAB — LIPID PANEL
LDL Cholesterol: 61 mg/dL (ref 0–99)
Total CHOL/HDL Ratio: 3

## 2011-09-29 LAB — BASIC METABOLIC PANEL
BUN: 11 mg/dL (ref 6–23)
Creatinine, Ser: 0.7 mg/dL (ref 0.4–1.2)
GFR: 93.58 mL/min (ref >60.00)
Potassium: 3.8 mEq/L (ref 3.5–5.1)

## 2011-09-29 LAB — POCT URINALYSIS DIPSTICK
Glucose, UA: NEGATIVE
Nitrite, UA: NEGATIVE
Urobilinogen, UA: 0.2

## 2011-09-29 LAB — CBC WITH DIFFERENTIAL/PLATELET
Eosinophils Relative: 2.8 % (ref 0.0–5.0)
Monocytes Relative: 6.8 % (ref 3.0–12.0)
Neutrophils Relative %: 61.8 % (ref 43.0–77.0)
Platelets: 146 10*3/uL — ABNORMAL LOW (ref 150.0–400.0)
WBC: 6.2 10*3/uL (ref 4.5–10.5)

## 2011-09-29 LAB — HEPATIC FUNCTION PANEL
ALT: 12 U/L (ref 0–35)
AST: 17 U/L (ref 0–37)
Alkaline Phosphatase: 54 U/L (ref 39–117)
Bilirubin, Direct: 0.1 mg/dL (ref 0.0–0.3)
Total Bilirubin: 0.5 mg/dL (ref 0.3–1.2)

## 2011-09-29 MED ORDER — ALPRAZOLAM 0.25 MG PO TABS: 0.2500 mg | ORAL_TABLET | Freq: Three times a day (TID) | ORAL | Status: DC

## 2011-09-29 MED ORDER — MOMETASONE FUROATE 50 MCG/ACT NA SUSP: 2.0000 | Freq: Every day | NASAL | Status: DC

## 2011-09-29 MED ORDER — ESCITALOPRAM OXALATE 20 MG PO TABS: 20.0000 mg | ORAL_TABLET | Freq: Every day | ORAL | Status: AC

## 2011-09-29 NOTE — Assessment & Plan Note (Signed)
Check bmd\ Take ca and vita D3

## 2011-09-29 NOTE — Assessment & Plan Note (Signed)
Per pain management.  

## 2011-09-29 NOTE — Progress Notes (Signed)
Subjective:    Joann Ford is a 66 y.o. female who presents for Medicare Annual/Subsequent preventive examination.  Preventive Screening-Counseling & Management  Tobacco History  Smoking status  . Current Everyday Smoker -- 1.0 packs/day for 40 years  . Types: Cigarettes  Smokeless tobacco  . Never Used     Problems Prior to Visit 1.   Current Problems (verified) Patient Active Problem List  Diagnosis  . B12 DEFICIENCY  . HYPERLIPIDEMIA  . HYPOKALEMIA  . OBESITY NOS  . ANXIETY  . PANIC ATTACK  . CERUMEN IMPACTION, BILATERAL  . HYPERTENSION  . SINUSITIS - ACUTE-NOS  . ACUTE BRONCHITIS  . DYSPEPSIA  . SPINAL STENOSIS, LUMBAR  . SCOLIOSIS, PROGRESSIVE IDIOPATHIC  . EDEMA  . SOB  . ELEVATED BP READING WITHOUT DX HYPERTENSION  . HX, PERSONAL, TOBACCO USE  . POSTMENOPAUSAL STATUS    Medications Prior to Visit Current Outpatient Prescriptions on File Prior to Visit  Medication Sig Dispense Refill  . Calcium 1500 MG tablet Take 1,200 mg by mouth daily.       . cholecalciferol (VITAMIN D) 1000 UNITS tablet Take 1,000 Units by mouth daily.        Marland Kitchen gabapentin (NEURONTIN) 100 MG capsule Take 300 mg by mouth daily.       . meloxicam (MOBIC) 15 MG tablet Take 15 mg by mouth daily.        Marland Kitchen morphine (MS CONTIN) 15 MG 12 hr tablet Take 15 mg by mouth 3 (three) times daily.       . rosuvastatin (CRESTOR) 10 MG tablet Take 1 tablet (10 mg total) by mouth at bedtime.  30 tablet  2  . DISCONTD: escitalopram (LEXAPRO) 20 MG tablet TAKE 1 TABLET BY MOUTH EVERY DAY  30 tablet  0  . DISCONTD: mometasone (NASONEX) 50 MCG/ACT nasal spray Place 2 sprays into the nose daily.  17 g  5  . furosemide (LASIX) 40 MG tablet Take 1 tablet (40 mg total) by mouth daily.  30 tablet  11  . polyethylene glycol (MIRALAX / GLYCOLAX) packet Take 17 g by mouth daily.         Current Facility-Administered Medications on File Prior to Visit  Medication Dose Route Frequency Provider Last Rate Last  Dose  . 0.9 %  sodium chloride infusion  500 mL Intravenous Continuous Hart Carwin, MD        Current Medications (verified) Current Outpatient Prescriptions  Medication Sig Dispense Refill  . ALPRAZolam (XANAX) 0.25 MG tablet Take 1 tablet (0.25 mg total) by mouth 3 (three) times daily.  30 tablet  2  . Calcium 1500 MG tablet Take 1,200 mg by mouth daily.       . cholecalciferol (VITAMIN D) 1000 UNITS tablet Take 1,000 Units by mouth daily.        Marland Kitchen escitalopram (LEXAPRO) 20 MG tablet Take 1 tablet (20 mg total) by mouth daily.  30 tablet  11  . gabapentin (NEURONTIN) 100 MG capsule Take 300 mg by mouth daily.       . meloxicam (MOBIC) 15 MG tablet Take 15 mg by mouth daily.        . mometasone (NASONEX) 50 MCG/ACT nasal spray Place 2 sprays into the nose daily.  17 g  5  . morphine (MS CONTIN) 15 MG 12 hr tablet Take 15 mg by mouth 3 (three) times daily.       . rosuvastatin (CRESTOR) 10 MG tablet Take 1 tablet (10 mg  total) by mouth at bedtime.  30 tablet  2  . DISCONTD: escitalopram (LEXAPRO) 20 MG tablet TAKE 1 TABLET BY MOUTH EVERY DAY  30 tablet  0  . DISCONTD: mometasone (NASONEX) 50 MCG/ACT nasal spray Place 2 sprays into the nose daily.  17 g  5  . furosemide (LASIX) 40 MG tablet Take 1 tablet (40 mg total) by mouth daily.  30 tablet  11  . polyethylene glycol (MIRALAX / GLYCOLAX) packet Take 17 g by mouth daily.         Current Facility-Administered Medications  Medication Dose Route Frequency Provider Last Rate Last Dose  . 0.9 %  sodium chloride infusion  500 mL Intravenous Continuous Hart Carwin, MD         Allergies (verified) Cefadroxil; Ciprofloxacin; and Penicillins   PAST HISTORY  Family History Family History  Problem Relation Age of Onset  . Cancer Father     Jaw Cancer  . Pneumonia Mother   . Cancer Cousin     jaw  . Colon cancer Neg Hx   . Stomach cancer Neg Hx     Social History History  Substance Use Topics  . Smoking status: Current Everyday  Smoker -- 1.0 packs/day for 40 years    Types: Cigarettes  . Smokeless tobacco: Never Used  . Alcohol Use: No     Are there smokers in your home (other than you)? Yes  Risk Factors Current exercise habits: walking  Dietary issues discussed: na   Cardiac risk factors: advanced age (older than 64 for men, 61 for women), dyslipidemia, obesity (BMI >= 30 kg/m2), sedentary lifestyle and smoking/ tobacco exposure.  Depression Screen (Note: if answer to either of the following is "Yes", a more complete depression screening is indicated)   Over the past two weeks, have you felt down, depressed or hopeless? No  Over the past two weeks, have you felt little interest or pleasure in doing things? No  Have you lost interest or pleasure in daily life? No  Do you often feel hopeless? No  Do you cry easily over simple problems? No  Activities of Daily Living In your present state of health, do you have any difficulty performing the following activities?:  Driving? No Managing money?  No Feeding yourself? No Getting from bed to chair? No Climbing a flight of stairs? No Preparing food and eating?: No Bathing or showering? No Getting dressed: No Getting to the toilet? No Using the toilet:No Moving around from place to place: No In the past year have you fallen or had a near fall?:No   Are you sexually active?  Yes  Do you have more than one partner?  No  Hearing Difficulties: No Do you often ask people to speak up or repeat themselves? No Do you experience ringing or noises in your ears? No Do you have difficulty understanding soft or whispered voices? No   Do you feel that you have a problem with memory? No  Do you often misplace items? No  Do you feel safe at home?  No  Cognitive Testing  Alert? Yes  Normal Appearance?Yes  Oriented to person? Yes  Place? Yes   Time? Yes  Recall of three objects?  Yes  Can perform simple calculations? Yes  Displays appropriate judgment?Yes  Can  read the correct time from a watch face?Yes   Advanced Directives have been discussed with the patient? Yes  List the Names of Other Physician/Practitioners you currently use: 1.  Pain---[place]  pain   Indicate any recent Medical Services you may have received from other than [facility] providers in the past year (date may be approximate).  Immunization History  Administered Date(s) Administered  . H1N1 03/06/2008  . Influenza Whole 12/15/2007, 12/18/2008, 12/17/2009  . Pneumococcal Polysaccharide 06/10/2004  . Td 10/25/2001  . Zoster 08/26/2010    Screening Tests Health Maintenance  Topic Date Due  . Mammogram  09/20/2008  . Pneumococcal Polysaccharide Vaccine Age 21 And Over  09/13/2010  . Tetanus/tdap  10/26/2011  . Influenza Vaccine  12/02/2011  . Colonoscopy  11/18/2020  . Zostavax  Completed    All answers were reviewed with the patient and necessary referrals were made:  Loreen Freud, DO   09/29/2011   History reviewed: allergies, current medications, past family history, past medical history, past social history, past surgical history and problem list  Review of Systems  Review of Systems  Constitutional: Negative for activity change, appetite change and fatigue.  HENT: Negative for hearing loss, congestion, tinnitus and ear discharge.   Eyes: Negative for visual disturbance (see optho-- no)  Respiratory: Negative for cough, chest tightness and shortness of breath.   Cardiovascular: Negative for chest pain, palpitations and leg swelling.  Gastrointestinal: Negative for abdominal pain, diarrhea, constipation and abdominal distention.  Genitourinary: Negative for urgency, frequency, decreased urine volume and difficulty urinating.  Musculoskeletal: Negative for back pain, arthralgias and gait problem.  Skin: Negative for color change, pallor and rash.  Neurological: Negative for dizziness, light-headedness, numbness and headaches.  Hematological: Negative for adenopathy.  Does not bruise/bleed easily.  Psychiatric/Behavioral: Negative for suicidal ideas, confusion, sleep disturbance, self-injury, dysphoric mood, decreased concentration and agitation.  Pt is able to read and write and can do all ADLs No risk for falling No abuse/ violence in home     Objective:     Vision by Snellen chart: right eye:see vision, left eye:see vision  Body mass index is 31.73 kg/(m^2). BP 120/70  Pulse 76  Temp 99.2 F (37.3 C) (Oral)  Ht 5' 5.5" (1.664 m)  Wt 193 lb 9.6 oz (87.816 kg)  BMI 31.73 kg/m2  SpO2 97%  BP 120/70  Pulse 76  Temp 99.2 F (37.3 C) (Oral)  Ht 5' 5.5" (1.664 m)  Wt 193 lb 9.6 oz (87.816 kg)  BMI 31.73 kg/m2  SpO2 97% General appearance: alert, cooperative, appears stated age and no distress Head: Normocephalic, without obvious abnormality, atraumatic Eyes: conjunctivae/corneas clear. PERRL, EOM's intact. Fundi benign. Ears: normal TM's and external ear canals both ears Nose: Nares normal. Septum midline. Mucosa normal. No drainage or sinus tenderness. Throat: lips, mucosa, and tongue normal; teeth and gums normal Neck: no adenopathy, no carotid bruit, no JVD, supple, symmetrical, trachea midline and thyroid not enlarged, symmetric, no tenderness/mass/nodules Back: scoliosis Lungs: clear to auscultation bilaterally Breasts: normal appearance, no masses or tenderness Heart: regular rate and rhythm, S1, S2 normal, no murmur, click, rub or gallop Abdomen: soft, non-tender; bowel sounds normal; no masses,  no organomegaly Pelvic: deferred Extremities: extremities normal, atraumatic, no cyanosis or edema Pulses: 2+ and symmetric Skin: Skin color, texture, turgor normal. No rashes or lesions Lymph nodes: Cervical, supraclavicular, and axillary nodes normal. Neurologic: Alert and oriented X 3, normal strength and tone. Normal symmetric reflexes. Normal coordination and gait Psych-- no obvious depression or anxiety     Assessment:       CPE     Plan:     During the course of the visit the patient was educated  and counseled about appropriate screening and preventive services including:    Pneumococcal vaccine   Screening mammography  Screening Pap smear and pelvic exam   Bone densitometry screening  Colorectal cancer screening  Diabetes screening  Glaucoma screening  Advanced directives: has NO advanced directive - not interested in additional information  Diet review for nutrition referral? Yes ____  Not Indicated __x__   Patient Instructions (the written plan) was given to the patient.  Medicare Attestation I have personally reviewed: The patient's medical and social history Their use of alcohol, tobacco or illicit drugs Their current medications and supplements The patient's functional ability including ADLs,fall risks, home safety risks, cognitive, and hearing and visual impairment Diet and physical activities Evidence for depression or mood disorders  The patient's weight, height, BMI, and visual acuity have been recorded in the chart.  I have made referrals, counseling, and provided education to the patient based on review of the above and I have provided the patient with a written personalized care plan for preventive services.     Loreen Freud, DO   09/29/2011

## 2011-09-29 NOTE — Assessment & Plan Note (Signed)
Stable. Refill meds

## 2011-09-29 NOTE — Addendum Note (Signed)
Addended by: Silvio Pate D on: 09/29/2011 03:07 PM   Modules accepted: Orders

## 2011-09-29 NOTE — Assessment & Plan Note (Signed)
Check labs 

## 2011-09-29 NOTE — Patient Instructions (Signed)
Preventive Care for Adults, Female A healthy lifestyle and preventive care can promote health and wellness. Preventive health guidelines for women include the following key practices.  A routine yearly physical is a good way to check with your caregiver about your health and preventive screening. It is a chance to share any concerns and updates on your health, and to receive a thorough exam.   Visit your dentist for a routine exam and preventive care every 6 months. Brush your teeth twice a day and floss once a day. Good oral hygiene prevents tooth decay and gum disease.   The frequency of eye exams is based on your age, health, family medical history, use of contact lenses, and other factors. Follow your caregiver's recommendations for frequency of eye exams.   Eat a healthy diet. Foods like vegetables, fruits, whole grains, low-fat dairy products, and lean protein foods contain the nutrients you need without too many calories. Decrease your intake of foods high in solid fats, added sugars, and salt. Eat the right amount of calories for you.Get information about a proper diet from your caregiver, if necessary.   Regular physical exercise is one of the most important things you can do for your health. Most adults should get at least 150 minutes of moderate-intensity exercise (any activity that increases your heart rate and causes you to sweat) each week. In addition, most adults need muscle-strengthening exercises on 2 or more days a week.   Maintain a healthy weight. The body mass index (BMI) is a screening tool to identify possible weight problems. It provides an estimate of body fat based on height and weight. Your caregiver can help determine your BMI, and can help you achieve or maintain a healthy weight.For adults 20 years and older:   A BMI below 18.5 is considered underweight.   A BMI of 18.5 to 24.9 is normal.   A BMI of 25 to 29.9 is considered overweight.   A BMI of 30 and above is  considered obese.   Maintain normal blood lipids and cholesterol levels by exercising and minimizing your intake of saturated fat. Eat a balanced diet with plenty of fruit and vegetables. Blood tests for lipids and cholesterol should begin at age 20 and be repeated every 5 years. If your lipid or cholesterol levels are high, you are over 50, or you are at high risk for heart disease, you may need your cholesterol levels checked more frequently.Ongoing high lipid and cholesterol levels should be treated with medicines if diet and exercise are not effective.   If you smoke, find out from your caregiver how to quit. If you do not use tobacco, do not start.   If you are pregnant, do not drink alcohol. If you are breastfeeding, be very cautious about drinking alcohol. If you are not pregnant and choose to drink alcohol, do not exceed 1 drink per day. One drink is considered to be 12 ounces (355 mL) of beer, 5 ounces (148 mL) of wine, or 1.5 ounces (44 mL) of liquor.   Avoid use of street drugs. Do not share needles with anyone. Ask for help if you need support or instructions about stopping the use of drugs.   High blood pressure causes heart disease and increases the risk of stroke. Your blood pressure should be checked at least every 1 to 2 years. Ongoing high blood pressure should be treated with medicines if weight loss and exercise are not effective.   If you are 55 to 66   years old, ask your caregiver if you should take aspirin to prevent strokes.   Diabetes screening involves taking a blood sample to check your fasting blood sugar level. This should be done once every 3 years, after age 45, if you are within normal weight and without risk factors for diabetes. Testing should be considered at a younger age or be carried out more frequently if you are overweight and have at least 1 risk factor for diabetes.   Breast cancer screening is essential preventive care for women. You should practice "breast  self-awareness." This means understanding the normal appearance and feel of your breasts and may include breast self-examination. Any changes detected, no matter how small, should be reported to a caregiver. Women in their 20s and 30s should have a clinical breast exam (CBE) by a caregiver as part of a regular health exam every 1 to 3 years. After age 40, women should have a CBE every year. Starting at age 40, women should consider having a mammography (breast X-ray test) every year. Women who have a family history of breast cancer should talk to their caregiver about genetic screening. Women at a high risk of breast cancer should talk to their caregivers about having magnetic resonance imaging (MRI) and a mammography every year.   The Pap test is a screening test for cervical cancer. A Pap test can show cell changes on the cervix that might become cervical cancer if left untreated. A Pap test is a procedure in which cells are obtained and examined from the lower end of the uterus (cervix).   Women should have a Pap test starting at age 21.   Between ages 21 and 29, Pap tests should be repeated every 2 years.   Beginning at age 30, you should have a Pap test every 3 years as long as the past 3 Pap tests have been normal.   Some women have medical problems that increase the chance of getting cervical cancer. Talk to your caregiver about these problems. It is especially important to talk to your caregiver if a new problem develops soon after your last Pap test. In these cases, your caregiver may recommend more frequent screening and Pap tests.   The above recommendations are the same for women who have or have not gotten the vaccine for human papillomavirus (HPV).   If you had a hysterectomy for a problem that was not cancer or a condition that could lead to cancer, then you no longer need Pap tests. Even if you no longer need a Pap test, a regular exam is a good idea to make sure no other problems are  starting.   If you are between ages 65 and 70, and you have had normal Pap tests going back 10 years, you no longer need Pap tests. Even if you no longer need a Pap test, a regular exam is a good idea to make sure no other problems are starting.   If you have had past treatment for cervical cancer or a condition that could lead to cancer, you need Pap tests and screening for cancer for at least 20 years after your treatment.   If Pap tests have been discontinued, risk factors (such as a new sexual partner) need to be reassessed to determine if screening should be resumed.   The HPV test is an additional test that may be used for cervical cancer screening. The HPV test looks for the virus that can cause the cell changes on the cervix.   The cells collected during the Pap test can be tested for HPV. The HPV test could be used to screen women aged 30 years and older, and should be used in women of any age who have unclear Pap test results. After the age of 30, women should have HPV testing at the same frequency as a Pap test.   Colorectal cancer can be detected and often prevented. Most routine colorectal cancer screening begins at the age of 50 and continues through age 75. However, your caregiver may recommend screening at an earlier age if you have risk factors for colon cancer. On a yearly basis, your caregiver may provide home test kits to check for hidden blood in the stool. Use of a small camera at the end of a tube, to directly examine the colon (sigmoidoscopy or colonoscopy), can detect the earliest forms of colorectal cancer. Talk to your caregiver about this at age 50, when routine screening begins. Direct examination of the colon should be repeated every 5 to 10 years through age 75, unless early forms of pre-cancerous polyps or small growths are found.   Hepatitis C blood testing is recommended for all people born from 1945 through 1965 and any individual with known risks for hepatitis C.    Practice safe sex. Use condoms and avoid high-risk sexual practices to reduce the spread of sexually transmitted infections (STIs). STIs include gonorrhea, chlamydia, syphilis, trichomonas, herpes, HPV, and human immunodeficiency virus (HIV). Herpes, HIV, and HPV are viral illnesses that have no cure. They can result in disability, cancer, and death. Sexually active women aged 25 and younger should be checked for chlamydia. Older women with new or multiple partners should also be tested for chlamydia. Testing for other STIs is recommended if you are sexually active and at increased risk.   Osteoporosis is a disease in which the bones lose minerals and strength with aging. This can result in serious bone fractures. The risk of osteoporosis can be identified using a bone density scan. Women ages 65 and over and women at risk for fractures or osteoporosis should discuss screening with their caregivers. Ask your caregiver whether you should take a calcium supplement or vitamin D to reduce the rate of osteoporosis.   Menopause can be associated with physical symptoms and risks. Hormone replacement therapy is available to decrease symptoms and risks. You should talk to your caregiver about whether hormone replacement therapy is right for you.   Use sunscreen with sun protection factor (SPF) of 30 or more. Apply sunscreen liberally and repeatedly throughout the day. You should seek shade when your shadow is shorter than you. Protect yourself by wearing long sleeves, pants, a wide-brimmed hat, and sunglasses year round, whenever you are outdoors.   Once a month, do a whole body skin exam, using a mirror to look at the skin on your back. Notify your caregiver of new moles, moles that have irregular borders, moles that are larger than a pencil eraser, or moles that have changed in shape or color.   Stay current with required immunizations.   Influenza. You need a dose every fall (or winter). The composition of  the flu vaccine changes each year, so being vaccinated once is not enough.   Pneumococcal polysaccharide. You need 1 to 2 doses if you smoke cigarettes or if you have certain chronic medical conditions. You need 1 dose at age 65 (or older) if you have never been vaccinated.   Tetanus, diphtheria, pertussis (Tdap, Td). Get 1 dose of   Tdap vaccine if you are younger than age 65, are over 65 and have contact with an infant, are a healthcare worker, are pregnant, or simply want to be protected from whooping cough. After that, you need a Td booster dose every 10 years. Consult your caregiver if you have not had at least 3 tetanus and diphtheria-containing shots sometime in your life or have a deep or dirty wound.   HPV. You need this vaccine if you are a woman age 26 or younger. The vaccine is given in 3 doses over 6 months.   Measles, mumps, rubella (MMR). You need at least 1 dose of MMR if you were born in 1957 or later. You may also need a second dose.   Meningococcal. If you are age 19 to 21 and a first-year college student living in a residence hall, or have one of several medical conditions, you need to get vaccinated against meningococcal disease. You may also need additional booster doses.   Zoster (shingles). If you are age 60 or older, you should get this vaccine.   Varicella (chickenpox). If you have never had chickenpox or you were vaccinated but received only 1 dose, talk to your caregiver to find out if you need this vaccine.   Hepatitis A. You need this vaccine if you have a specific risk factor for hepatitis A virus infection or you simply wish to be protected from this disease. The vaccine is usually given as 2 doses, 6 to 18 months apart.   Hepatitis B. You need this vaccine if you have a specific risk factor for hepatitis B virus infection or you simply wish to be protected from this disease. The vaccine is given in 3 doses, usually over 6 months.  Preventive Services /  Frequency Ages 19 to 39  Blood pressure check.** / Every 1 to 2 years.   Lipid and cholesterol check.** / Every 5 years beginning at age 20.   Clinical breast exam.** / Every 3 years for women in their 20s and 30s.   Pap test.** / Every 2 years from ages 21 through 29. Every 3 years starting at age 30 through age 65 or 70 with a history of 3 consecutive normal Pap tests.   HPV screening.** / Every 3 years from ages 30 through ages 65 to 70 with a history of 3 consecutive normal Pap tests.   Hepatitis C blood test.** / For any individual with known risks for hepatitis C.   Skin self-exam. / Monthly.   Influenza immunization.** / Every year.   Pneumococcal polysaccharide immunization.** / 1 to 2 doses if you smoke cigarettes or if you have certain chronic medical conditions.   Tetanus, diphtheria, pertussis (Tdap, Td) immunization. / A one-time dose of Tdap vaccine. After that, you need a Td booster dose every 10 years.   HPV immunization. / 3 doses over 6 months, if you are 26 and younger.   Measles, mumps, rubella (MMR) immunization. / You need at least 1 dose of MMR if you were born in 1957 or later. You may also need a second dose.   Meningococcal immunization. / 1 dose if you are age 19 to 21 and a first-year college student living in a residence hall, or have one of several medical conditions, you need to get vaccinated against meningococcal disease. You may also need additional booster doses.   Varicella immunization.** / Consult your caregiver.   Hepatitis A immunization.** / Consult your caregiver. 2 doses, 6 to 18 months   apart.   Hepatitis B immunization.** / Consult your caregiver. 3 doses usually over 6 months.  Ages 40 to 64  Blood pressure check.** / Every 1 to 2 years.   Lipid and cholesterol check.** / Every 5 years beginning at age 20.   Clinical breast exam.** / Every year after age 40.   Mammogram.** / Every year beginning at age 40 and continuing for as  long as you are in good health. Consult with your caregiver.   Pap test.** / Every 3 years starting at age 30 through age 65 or 70 with a history of 3 consecutive normal Pap tests.   HPV screening.** / Every 3 years from ages 30 through ages 65 to 70 with a history of 3 consecutive normal Pap tests.   Fecal occult blood test (FOBT) of stool. / Every year beginning at age 50 and continuing until age 75. You may not need to do this test if you get a colonoscopy every 10 years.   Flexible sigmoidoscopy or colonoscopy.** / Every 5 years for a flexible sigmoidoscopy or every 10 years for a colonoscopy beginning at age 50 and continuing until age 75.   Hepatitis C blood test.** / For all people born from 1945 through 1965 and any individual with known risks for hepatitis C.   Skin self-exam. / Monthly.   Influenza immunization.** / Every year.   Pneumococcal polysaccharide immunization.** / 1 to 2 doses if you smoke cigarettes or if you have certain chronic medical conditions.   Tetanus, diphtheria, pertussis (Tdap, Td) immunization.** / A one-time dose of Tdap vaccine. After that, you need a Td booster dose every 10 years.   Measles, mumps, rubella (MMR) immunization. / You need at least 1 dose of MMR if you were born in 1957 or later. You may also need a second dose.   Varicella immunization.** / Consult your caregiver.   Meningococcal immunization.** / Consult your caregiver.   Hepatitis A immunization.** / Consult your caregiver. 2 doses, 6 to 18 months apart.   Hepatitis B immunization.** / Consult your caregiver. 3 doses, usually over 6 months.  Ages 65 and over  Blood pressure check.** / Every 1 to 2 years.   Lipid and cholesterol check.** / Every 5 years beginning at age 20.   Clinical breast exam.** / Every year after age 40.   Mammogram.** / Every year beginning at age 40 and continuing for as long as you are in good health. Consult with your caregiver.   Pap test.** /  Every 3 years starting at age 30 through age 65 or 70 with a 3 consecutive normal Pap tests. Testing can be stopped between 65 and 70 with 3 consecutive normal Pap tests and no abnormal Pap or HPV tests in the past 10 years.   HPV screening.** / Every 3 years from ages 30 through ages 65 or 70 with a history of 3 consecutive normal Pap tests. Testing can be stopped between 65 and 70 with 3 consecutive normal Pap tests and no abnormal Pap or HPV tests in the past 10 years.   Fecal occult blood test (FOBT) of stool. / Every year beginning at age 50 and continuing until age 75. You may not need to do this test if you get a colonoscopy every 10 years.   Flexible sigmoidoscopy or colonoscopy.** / Every 5 years for a flexible sigmoidoscopy or every 10 years for a colonoscopy beginning at age 50 and continuing until age 75.   Hepatitis   C blood test.** / For all people born from 1945 through 1965 and any individual with known risks for hepatitis C.   Osteoporosis screening.** / A one-time screening for women ages 65 and over and women at risk for fractures or osteoporosis.   Skin self-exam. / Monthly.   Influenza immunization.** / Every year.   Pneumococcal polysaccharide immunization.** / 1 dose at age 65 (or older) if you have never been vaccinated.   Tetanus, diphtheria, pertussis (Tdap, Td) immunization. / A one-time dose of Tdap vaccine if you are over 65 and have contact with an infant, are a healthcare worker, or simply want to be protected from whooping cough. After that, you need a Td booster dose every 10 years.   Varicella immunization.** / Consult your caregiver.   Meningococcal immunization.** / Consult your caregiver.   Hepatitis A immunization.** / Consult your caregiver. 2 doses, 6 to 18 months apart.   Hepatitis B immunization.** / Check with your caregiver. 3 doses, usually over 6 months.  ** Family history and personal history of risk and conditions may change your caregiver's  recommendations. Document Released: 04/15/2001 Document Revised: 02/06/2011 Document Reviewed: 07/15/2010 [employer] Patient Information 2012 [employer]. 

## 2011-09-29 NOTE — Assessment & Plan Note (Signed)
Stable con't meds 

## 2011-10-02 LAB — URINE CULTURE: Colony Count: 15000

## 2012-01-06 ENCOUNTER — Ambulatory Visit (INDEPENDENT_AMBULATORY_CARE_PROVIDER_SITE_OTHER): Payer: Medicare Other

## 2012-01-06 DIAGNOSIS — Z23 Encounter for immunization: Secondary | ICD-10-CM

## 2012-01-21 ENCOUNTER — Telehealth: Payer: Self-pay

## 2012-01-21 NOTE — Telephone Encounter (Signed)
FYI---Reviewed BMD results and made the patient aware that she has Osteoporosis. I offered the patient Fosamax 70 mg  1 po weekly # 4 with 11 refills. The patient declined and stated she would rather try something else, I offered Prolia or Reclast and she stated she would like product info and she will discuss when she comes in to see Dr.Lowne before she goes on her trip-     KP

## 2012-01-21 NOTE — Telephone Encounter (Signed)
Inform her to check with her insurance.  They may not approve either one unless she tries po.

## 2012-01-21 NOTE — Telephone Encounter (Signed)
The information has been mailed and the patient will call me with her decision.    KP

## 2012-02-20 ENCOUNTER — Encounter: Payer: Self-pay | Admitting: Family Medicine

## 2013-08-20 DIAGNOSIS — G8929 Other chronic pain: Secondary | ICD-10-CM | POA: Insufficient documentation

## 2013-08-20 DIAGNOSIS — F329 Major depressive disorder, single episode, unspecified: Secondary | ICD-10-CM | POA: Insufficient documentation

## 2013-08-20 DIAGNOSIS — G629 Polyneuropathy, unspecified: Secondary | ICD-10-CM | POA: Insufficient documentation

## 2013-08-20 DIAGNOSIS — F32A Depression, unspecified: Secondary | ICD-10-CM | POA: Insufficient documentation

## 2013-09-14 DIAGNOSIS — M25551 Pain in right hip: Secondary | ICD-10-CM | POA: Insufficient documentation

## 2016-07-09 DIAGNOSIS — J438 Other emphysema: Secondary | ICD-10-CM | POA: Insufficient documentation

## 2017-02-03 ENCOUNTER — Telehealth: Payer: Self-pay

## 2017-02-03 NOTE — Telephone Encounter (Signed)
SENT NOTES TO SCHEDULING 

## 2017-03-11 ENCOUNTER — Encounter: Payer: Self-pay | Admitting: Emergency Medicine

## 2017-03-11 ENCOUNTER — Ambulatory Visit: Payer: Medicare HMO | Admitting: Emergency Medicine

## 2017-03-11 VITALS — BP 114/60 | HR 61 | Ht 64.0 in | Wt 166.0 lb

## 2017-03-11 DIAGNOSIS — Z720 Tobacco use: Secondary | ICD-10-CM

## 2017-03-11 DIAGNOSIS — M4100 Infantile idiopathic scoliosis, site unspecified: Secondary | ICD-10-CM

## 2017-03-11 DIAGNOSIS — J449 Chronic obstructive pulmonary disease, unspecified: Secondary | ICD-10-CM | POA: Diagnosis not present

## 2017-03-11 NOTE — Patient Instructions (Signed)
We will arrange for pulmonary function testing; this will help [place] learn how much your smoking and / or spine curvature is affecting your breathing.  Based on your breathing tests we may decide to try starting an inhaled medication for COPD. Based on your breathing tests we will engage your risk for possible back surgery We discussed your smoking.  I agree that you need to work on decreasing and then set a goal for stopping altogether. Follow with Dr Lamonte Sakai next available after pulmonary function testing to review the results.

## 2017-03-11 NOTE — Assessment & Plan Note (Signed)
Being assessed for possible spine surgery by Dr Marykay Lex in [place]. Suspect some increased risk due to her tobacco hx, will quantify w PFT.

## 2017-03-11 NOTE — Progress Notes (Signed)
Subjective:    Patient ID: Joann Ford, female    DOB: [birth year], 72 y.o.   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  Based on her outpatient clinic note she also carries a history of COPD although she is not on any medications currently.  She has had some episodic bronchitis, but currently believes that her breathing is doing well. She is being considered for possible surgical repair of her scoliosis / kyphosis by Dr Marykay Lex in [place]. She has been on albuterol before, unclear that it helped her, may have helped her clear secretions. No hospitalizations, last [prednisone was 10 yrs ago.    Review of Systems  Constitutional: Negative for fever and unexpected weight change.  HENT: Negative for congestion, dental problem, ear pain, nosebleeds, postnasal drip, rhinorrhea, sinus pressure, sneezing, sore throat and trouble swallowing.   Eyes: Negative for redness and itching.  Respiratory: Positive for shortness of breath. Negative for cough, chest tightness and wheezing.   Cardiovascular: Negative for palpitations and leg swelling.  Gastrointestinal: Negative for nausea and vomiting.  Genitourinary: Negative for dysuria.  Musculoskeletal: Negative for joint swelling.  Skin: Negative for rash.  Neurological: Negative for headaches.  Hematological: Does not bruise/bleed easily.  Psychiatric/Behavioral: Negative for dysphoric mood. The patient is nervous/anxious.      Past Medical History:  Diagnosis Date  . Anxiety   . Arthritis   . Chronic pain   . Hyperlipidemia   . Osteoporosis      Family History  Problem Relation Age of Onset  . Cancer Father        Jaw Cancer  . Pneumonia Mother   . Cancer Cousin        jaw  . Colon cancer Neg Hx   . Stomach cancer Neg Hx      Social History   Socioeconomic History  . Marital status: Married    Spouse name:  Not on file  . Number of children: Not on file  . Years of education: Not on file  . Highest education level: Not on file  Social Needs  . Financial resource strain: Not on file  . Food insecurity - worry: Not on file  . Food insecurity - inability: Not on file  . Transportation needs - medical: Not on file  . Transportation needs - non-medical: Not on file  Occupational History  . Occupation: hair dresser  Tobacco Use  . Smoking status: Current Every Day Smoker    Packs/day: 1.00    Years: 40.00    Pack years: 40.00    Types: Cigarettes  . Smokeless tobacco: Never Used  Substance and Sexual Activity  . Alcohol use: No  . Drug use: No  . Sexual activity: Yes    Partners: Male  Other Topics Concern  . Not on file  Social History Narrative   Exercise--  walk     Allergies  Allergen Reactions  . Cefadroxil Swelling    Throat closes  . Ciprofloxacin     Throat closes  . Penicillins Swelling    throat closes     Outpatient Medications Prior to Visit  Medication Sig Dispense Refill  . Calcium 1500 MG tablet Take 1,200 mg by mouth daily.     . cholecalciferol (VITAMIN D) 1000 UNITS tablet Take 1,000 Units by mouth daily.      Marland Kitchen escitalopram (LEXAPRO) 20 MG tablet Take 1  tablet (20 mg total) by mouth daily. 30 tablet 11  . gabapentin (NEURONTIN) 100 MG capsule Take 300 mg by mouth daily.     Marland Kitchen morphine (MS CONTIN) 15 MG 12 hr tablet Take 15 mg by mouth 3 (three) times daily.     Marland Kitchen ALPRAZolam (XANAX) 0.25 MG tablet Take 1 tablet (0.25 mg total) by mouth 3 (three) times daily. 30 tablet 2  . meloxicam (MOBIC) 15 MG tablet Take 15 mg by mouth daily.      . polyethylene glycol (MIRALAX / GLYCOLAX) packet Take 17 g by mouth daily.      . rosuvastatin (CRESTOR) 10 MG tablet Take 1 tablet (10 mg total) by mouth at bedtime. 30 tablet 2  . furosemide (LASIX) 40 MG tablet Take 1 tablet (40 mg total) by mouth daily. 30 tablet 11  . mometasone (NASONEX) 50 MCG/ACT nasal spray Place 2  sprays into the nose daily. 17 g 5   Facility-Administered Medications Prior to Visit  Medication Dose Route Frequency Provider Last Rate Last Dose  . 0.9 %  sodium chloride infusion  500 mL Intravenous Continuous Lafayette Dragon, MD            Objective:   Physical Exam Vitals:   03/11/17 1346 03/11/17 1347  BP:  114/60  Pulse:  61  SpO2:  94%  Weight: 166 lb (75.3 kg)   Height: 5\' 4"  (1.626 m)    Gen: Pleasant, well-nourished, in no distress,  normal affect, severe kyphosis and scoliosis  ENT: No lesions,  mouth clear,  oropharynx clear, no postnasal drip  Neck: No JVD, no stridor  Lungs: No use of accessory muscles, clear bilaterally, no crackles, no wheezes  Cardiovascular: RRR, heart sounds normal, no murmur or gallops, no peripheral edema  Musculoskeletal: No deformities, no cyanosis or clubbing  Neuro: alert, non focal  Skin: Warm, no lesions or rashes       Assessment & Plan:  SCOLIOSIS, PROGRESSIVE IDIOPATHIC Being assessed for possible spine surgery by Dr Marykay Lex in [place]. Suspect some increased risk due to her tobacco hx, will quantify w PFT.   COPD (chronic obstructive pulmonary disease) ([facility]) Presumed COPD, history of continued tobacco.  She may benefit from bronchodilators.  I would like to wait until after her pulmonary function testing to determine whether she should undertake a trial.  Tobacco abuse Discussed with her today.  We will work on setting some goals for decreasing at her next visit  Baltazar Apo, MD, PhD 03/11/2017, 2:17 PM [facility] [phone] or if no answer [phone]

## 2017-03-11 NOTE — Assessment & Plan Note (Signed)
Discussed with her today.  We will work on setting some goals for decreasing at her next visit

## 2017-03-11 NOTE — Assessment & Plan Note (Signed)
Presumed COPD, history of continued tobacco.  She may benefit from bronchodilators.  I would like to wait until after her pulmonary function testing to determine whether she should undertake a trial.

## 2017-03-16 ENCOUNTER — Ambulatory Visit: Payer: Medicare Other | Admitting: Internal Medicine

## 2017-04-14 ENCOUNTER — Ambulatory Visit: Payer: Medicare HMO | Admitting: Internal Medicine

## 2017-05-18 ENCOUNTER — Encounter: Payer: Self-pay | Admitting: Emergency Medicine

## 2017-05-18 ENCOUNTER — Ambulatory Visit: Payer: Medicare HMO | Admitting: Emergency Medicine

## 2017-05-18 ENCOUNTER — Ambulatory Visit (INDEPENDENT_AMBULATORY_CARE_PROVIDER_SITE_OTHER): Payer: Medicare HMO | Admitting: Emergency Medicine

## 2017-05-18 DIAGNOSIS — J449 Chronic obstructive pulmonary disease, unspecified: Secondary | ICD-10-CM

## 2017-05-18 LAB — PULMONARY FUNCTION TEST
DL/VA % pred: 114 %
DL/VA: 5.27 ml/min/mmHg/L
DLCO unc % pred: 73 %
DLCO unc: 16.4 ml/min/mmHg
FEF 25-75 Post: 1.28 L/sec
FEF 25-75 Pre: 1.46 L/sec
FEF2575-%Change-Post: -12 %
FEF2575-%Pred-Post: 74 %
FEF2575-%Pred-Pre: 84 %
FEV1-%Change-Post: -1 %
FEV1-%Pred-Post: 64 %
FEV1-%Pred-Pre: 66 %
FEV1-Post: 1.34 L
FEV1-Pre: 1.37 L
FEV1FVC-%Change-Post: 4 %
FEV1FVC-%Pred-Pre: 107 %
FEV6-%Change-Post: -4 %
FEV6-%Pred-Post: 59 %
FEV6-%Pred-Pre: 62 %
FEV6-Post: 1.57 L
FEV6-Pre: 1.65 L
FEV6FVC-%Pred-Post: 105 %
FEV6FVC-%Pred-Pre: 105 %
FVC-%Change-Post: -5 %
FVC-%Pred-Post: 56 %
FVC-%Pred-Pre: 60 %
FVC-Post: 1.57 L
FVC-Pre: 1.67 L
Post FEV1/FVC ratio: 86 %
Post FEV6/FVC ratio: 100 %
Pre FEV1/FVC ratio: 82 %
Pre FEV6/FVC Ratio: 100 %
RV % pred: 89 %
RV: 1.92 L
TLC % pred: 72 %
TLC: 3.51 L

## 2017-05-18 MED ORDER — ALBUTEROL SULFATE (2.5 MG/3ML) 0.083% IN NEBU: 2.5000 mg | INHALATION_SOLUTION | Freq: Four times a day (QID) | RESPIRATORY_TRACT | 5 refills | Status: DC | PRN

## 2017-05-18 MED ORDER — TIOTROPIUM BROMIDE MONOHYDRATE 2.5 MCG/ACT IN AERS: 2.0000 | INHALATION_SPRAY | Freq: Every day | RESPIRATORY_TRACT | 5 refills | Status: DC

## 2017-05-18 NOTE — Assessment & Plan Note (Signed)
Significant restrictive lung disease confirmed on her pulmonary function testing.  She could not get her surgery done in [place] so she will be looking for a local surgeon depending on her cardiac workup, pulmonary evaluation.

## 2017-05-18 NOTE — Progress Notes (Signed)
PFT done today. 

## 2017-05-18 NOTE — Progress Notes (Signed)
Subjective:    Patient ID: Joann Ford, female    DOB: May 09, 1945, 72 y.o.   MRN: [id number]  COPD  She complains of shortness of breath. There is no cough or wheezing. Pertinent negatives include no ear pain, fever, headaches, postnasal drip, rhinorrhea, sneezing, sore throat or trouble swallowing. Her past medical history is significant for COPD.   72 year old woman, active smoker (40 pack years) with a history of scoliosis, chronic back pain requiring chronic narcotic use, allergic rhinitis.  Based on her outpatient clinic note she also carries a history of COPD although she is not on any medications currently.  She has had some episodic bronchitis, but currently believes that her breathing is doing well. She is being considered for possible surgical repair of her scoliosis / kyphosis by Dr Marykay Lex in [place]. She has been on albuterol before, unclear that it helped her, may have helped her clear secretions. No hospitalizations, last [prednisone was 10 yrs ago.   ROV 05/18/17 --this is a follow-up visit for patient with a history of tobacco use, scoliosis chronic narcotic use.  She has carried a diagnosis of COPD.  We performed pulmonary function testing today that I have reviewed. These show mixed disease, moderately severely decreased. Restricted volumes. Decreased DLCO that corrects for [place].   She still works. She has exertional SOB, but denies significant limitation.  Since last time she has been treated with azithro for bronchitis, used albuterol nebs at the time. Otherwise uses rarely.  She still smokes about 1 pk a day.    Review of Systems  Constitutional: Negative for fever and unexpected weight change.  HENT: Negative for congestion, dental problem, ear pain, nosebleeds, postnasal drip, rhinorrhea, sinus pressure, sneezing, sore throat and trouble swallowing.   Eyes: Negative for redness and itching.  Respiratory: Positive for shortness of breath. Negative for cough, chest tightness  and wheezing.   Cardiovascular: Negative for palpitations and leg swelling.  Gastrointestinal: Negative for nausea and vomiting.  Genitourinary: Negative for dysuria.  Musculoskeletal: Negative for joint swelling.  Skin: Negative for rash.  Neurological: Negative for headaches.  Hematological: Does not bruise/bleed easily.  Psychiatric/Behavioral: Negative for dysphoric mood. The patient is nervous/anxious.      Past Medical History:  Diagnosis Date  . Anxiety   . Arthritis   . Chronic pain   . Hyperlipidemia   . Osteoporosis      Family History  Problem Relation Age of Onset  . Cancer Father        Jaw Cancer  . Pneumonia Mother   . Cancer Cousin        jaw  . Colon cancer Neg Hx   . Stomach cancer Neg Hx      Social History   Socioeconomic History  . Marital status: Married    Spouse name: Not on file  . Number of children: Not on file  . Years of education: Not on file  . Highest education level: Not on file  Social Needs  . Financial resource strain: Not on file  . Food insecurity - worry: Not on file  . Food insecurity - inability: Not on file  . Transportation needs - medical: Not on file  . Transportation needs - non-medical: Not on file  Occupational History  . Occupation: hair dresser  Tobacco Use  . Smoking status: Current Every Day Smoker    Packs/day: 1.00    Years: 40.00    Pack years: 40.00    Types: Cigarettes  .  Smokeless tobacco: Never Used  Substance and Sexual Activity  . Alcohol use: No  . Drug use: No  . Sexual activity: Yes    Partners: Male  Other Topics Concern  . Not on file  Social History Narrative   Exercise--  walk     Allergies  Allergen Reactions  . Cefadroxil Swelling    Throat closes  . Ciprofloxacin     Throat closes  . Penicillins Swelling    throat closes     Outpatient Medications Prior to Visit  Medication Sig Dispense Refill  . Calcium 1500 MG tablet Take 1,200 mg by mouth daily.     .  cholecalciferol (VITAMIN D) 1000 UNITS tablet Take 1,000 Units by mouth daily.      Marland Kitchen escitalopram (LEXAPRO) 20 MG tablet Take 1 tablet (20 mg total) by mouth daily. 30 tablet 11  . gabapentin (NEURONTIN) 100 MG capsule Take 300 mg by mouth daily.     Marland Kitchen morphine (MS CONTIN) 15 MG 12 hr tablet Take 15 mg by mouth 3 (three) times daily.      Facility-Administered Medications Prior to Visit  Medication Dose Route Frequency Provider Last Rate Last Dose  . 0.9 %  sodium chloride infusion  500 mL Intravenous Continuous Lafayette Dragon, MD            Objective:   Physical Exam Vitals:   05/18/17 1054  BP: 128/86  Pulse: 95  SpO2: 93%  Weight: 163 lb (73.9 kg)  Height: 5' 2.5" (1.588 m)   Gen: Pleasant, well-nourished, in no distress,  normal affect, severe kyphosis and scoliosis  ENT: No lesions,  mouth clear,  oropharynx clear, no postnasal drip  Neck: No JVD, no stridor  Lungs: No use of accessory muscles, clear bilaterally, no crackles, no wheezes  Cardiovascular: RRR, heart sounds normal, no murmur or gallops, no peripheral edema  Musculoskeletal: No deformities, no cyanosis or clubbing  Neuro: alert, non focal  Skin: Warm, no lesions or rashes       Assessment & Plan:  SCOLIOSIS, PROGRESSIVE IDIOPATHIC Significant restrictive lung disease confirmed on her pulmonary function testing.  She could not get her surgery done in [place] so she will be looking for a local surgeon depending on her cardiac workup, pulmonary evaluation.  COPD (chronic obstructive pulmonary disease) ([facility]) Mixed disease noted on her pulmonary function testing.  There is a significant component of obstruction here.  She is not on a schedule bronchodilator and I would like to try this to see if she gets any clinical benefit.  We will start Spiriva Respimat.  She rarely uses albuterol except for when she is flaring.  She had such a flare in February since our last visit.  She wants albuterol nebulizers  refilled and we will do this today.  Baltazar Apo, MD, PhD 05/18/2017, 11:30 AM [facility] [phone] or if no answer [phone]

## 2017-05-18 NOTE — Assessment & Plan Note (Signed)
Mixed disease noted on her pulmonary function testing.  There is a significant component of obstruction here.  She is not on a schedule bronchodilator and I would like to try this to see if she gets any clinical benefit.  We will start Spiriva Respimat.  She rarely uses albuterol except for when she is flaring.  She had such a flare in February since our last visit.  She wants albuterol nebulizers refilled and we will do this today.

## 2017-05-18 NOTE — Patient Instructions (Signed)
Please continue to work on decreasing your smoking.  You need to stop altogether. We will start a new medication, Spiriva Respimat 2 sprays once a day. We will refill your prescription for albuterol nebulizer treatments.  Use these as needed for shortness of breath, coughing, wheezing. Follow with Dr Lamonte Sakai in 1 month or next available to discuss how you have responded to the new inhaled medication.

## 2017-06-08 ENCOUNTER — Ambulatory Visit: Payer: Medicare HMO | Admitting: Internal Medicine

## 2017-06-08 ENCOUNTER — Encounter: Payer: Self-pay | Admitting: Internal Medicine

## 2017-06-08 VITALS — BP 132/71 | HR 70 | Ht 63.0 in | Wt 164.8 lb

## 2017-06-08 DIAGNOSIS — J449 Chronic obstructive pulmonary disease, unspecified: Secondary | ICD-10-CM

## 2017-06-08 DIAGNOSIS — F17200 Nicotine dependence, unspecified, uncomplicated: Secondary | ICD-10-CM

## 2017-06-08 DIAGNOSIS — Z789 Other specified health status: Secondary | ICD-10-CM | POA: Diagnosis not present

## 2017-06-08 DIAGNOSIS — R011 Cardiac murmur, unspecified: Secondary | ICD-10-CM | POA: Insufficient documentation

## 2017-06-08 DIAGNOSIS — R0609 Other forms of dyspnea: Secondary | ICD-10-CM

## 2017-06-08 DIAGNOSIS — Z01810 Encounter for preprocedural cardiovascular examination: Secondary | ICD-10-CM

## 2017-06-08 NOTE — Patient Instructions (Addendum)
Your physician has requested that you have an echocardiogram @ [address]. Echocardiography is a painless test that uses sound waves to create images of your heart. It provides your doctor with information about the size and shape of your heart and how well your heart's chambers and valves are working. This procedure takes approximately one hour. There are no restrictions for this procedure.  Dr. Debara Pickett has ordered a Lexiscan Myocardial Perfusion Imaging Study @ Dr. Lysbeth Penner office.  Please arrive 15 minutes prior to your appointment time for registration and insurance purposes.   The test will take approximately 3 to 4 hours to complete; you may bring reading material.  If someone comes with you to your appointment, they will need to remain in the main lobby due to limited space in the testing area. **If you are pregnant or breastfeeding, please notify the nuclear lab prior to your appointment**   How to prepare for your Myocardial Perfusion Test:  Do not eat or drink 3 hours prior to your test, except you may have water.  Do not consume products containing caffeine (regular or decaffeinated) 12 hours prior to your test. (ex: coffee, chocolate, sodas, tea).  Do wear comfortable clothes (no dresses or overalls) and walking shoes, tennis shoes preferred (No heels or open toe shoes are allowed).  Do NOT wear cologne, perfume, aftershave, or lotions (deodorant is allowed).  If you use an inhaler, use it the AM of your test and bring it with you.   If you use a nebulizer, use it the AM of your test.   If these instructions are not followed, your test will have to be rescheduled.  [facility] - Dr. Kary Kos, Dr. Sherley Bounds  Your physician recommends that you schedule a follow-up appointment with Dr. Debara Pickett after your testing.

## 2017-06-08 NOTE — Progress Notes (Signed)
OFFICE CONSULT NOTE  Chief Complaint:  Short of breath, preoperative clearance  Primary Care Physician: Jaquita Rector, PA-C  HPI:  Joann Ford is a 72 y.o. female who is being seen today for the evaluation of shortness of breath and preoperative clearance at the request of Rowe Pavy, MD. This is a 72 year old female with a 40-pack-year smoking history, who continues to smoke, history of dyslipidemia, anxiety, arthritis and osteoporosis with marked thoracic kyphosis.  Recently she was evaluated for possible spine straightening surgery.  Due to insurance reason she canceled any further surgery options and recently was seen for preoperative clearance and management of COPD by Dr. Lamonte Sakai.  He felt that she was acceptable risk for surgery, but she is being seen now for cardiovascular risk assessment.  She had a CT scan of the thoracic and lumbar spine in November 2018.  This showed significant coronary artery calcification and aortic atherosclerosis.  She has history of dyslipidemia but is not currently on a statin medication.  Symptomatically, she gets short of breath with minimal exertion.  She is not able to do a lot of activity due to her back problem.  There is no significant family history of heart disease in her first-degree relatives.  Does report a history of heart murmur.  PMHx:  Past Medical History:  Diagnosis Date  . Anxiety   . Arthritis   . Chronic pain   . Hyperlipidemia   . Osteoporosis     Past Surgical History:  Procedure Laterality Date  . I and D of boil  1998, 1999   bilateral axilla    FAMHx:  Family History  Problem Relation Age of Onset  . Cancer Father        Jaw Cancer  . Pneumonia Mother   . Cancer Cousin        jaw  . Colon cancer Neg Hx   . Stomach cancer Neg Hx     SOCHx:   reports that she has been smoking cigarettes.  She started smoking about 52 years ago. She has a 40.00 pack-year smoking history. She has never used smokeless  tobacco. She reports that she does not drink alcohol or use drugs.  ALLERGIES:  Allergies  Allergen Reactions  . Cefadroxil Swelling    Throat closes  . Ciprofloxacin     Throat closes  . Penicillins Swelling    throat closes    ROS: Pertinent items noted in HPI and remainder of comprehensive ROS otherwise negative.  HOME MEDS: Current Outpatient Medications on File Prior to Visit  Medication Sig Dispense Refill  . albuterol (PROVENTIL) (2.5 MG/3ML) 0.083% nebulizer solution Take 3 mLs (2.5 mg total) by nebulization every 6 (six) hours as needed for wheezing or shortness of breath. 300 mL 5  . Calcium 1500 MG tablet Take 1,200 mg by mouth daily.     . cholecalciferol (VITAMIN D) 1000 UNITS tablet Take 1,000 Units by mouth daily.      Marland Kitchen escitalopram (LEXAPRO) 20 MG tablet Take 1 tablet (20 mg total) by mouth daily. 30 tablet 11  . gabapentin (NEURONTIN) 100 MG capsule Take 300 mg by mouth daily.     Marland Kitchen morphine (MS CONTIN) 15 MG 12 hr tablet Take 15 mg by mouth 3 (three) times daily.     . Tiotropium Bromide Monohydrate (SPIRIVA RESPIMAT) 2.5 MCG/ACT AERS Inhale 2 puffs into the lungs daily. 1 Inhaler 5   Current Facility-Administered Medications on File Prior to Visit  Medication Dose Route  Frequency Provider Last Rate Last Dose  . 0.9 %  sodium chloride infusion  500 mL Intravenous Continuous Lafayette Dragon, MD        LABS/IMAGING: No results found for this or any previous visit (from the past 48 hour(s)). No results found.  LIPID PANEL:    Component Value Date/Time   CHOL 127 09/29/2011 1120   TRIG 91.0 09/29/2011 1120   HDL 47.70 09/29/2011 1120   CHOLHDL 3 09/29/2011 1120   VLDL 18.2 09/29/2011 1120   LDLCALC 61 09/29/2011 1120   LDLDIRECT 156.9 02/07/2008 1407    WEIGHTS: Wt Readings from Last 3 Encounters:  06/08/17 164 lb 12.8 oz (74.8 kg)  05/18/17 163 lb (73.9 kg)  03/11/17 166 lb (75.3 kg)    VITALS: BP 132/71 (BP Location: Left Arm, Cuff Size: Normal)    Pulse 70   Ht 5\' 3"  (1.6 m)   Wt 164 lb 12.8 oz (74.8 kg)   SpO2 94%   BMI 29.19 kg/m   EXAM: General appearance: alert, appears older than stated age, no distress and Heart thoracic kyphosis Neck: no carotid bruit, no JVD and thyroid not enlarged, symmetric, no tenderness/mass/nodules Lungs: diminished breath sounds bilaterally Heart: regular rate and rhythm, S1, S2 normal and systolic murmur: systolic ejection 3/6, crescendo at 2nd right intercostal space Abdomen: soft, non-tender; bowel sounds normal; no masses,  no organomegaly Extremities: extremities normal, atraumatic, no cyanosis or edema Pulses: 2+ and symmetric Skin: Skin color, texture, turgor normal. No rashes or lesions Neurologic: Grossly normal Psych: Pleasant  EKG: Sinus rhythm at 65- personally reviewed  ASSESSMENT: 1. Indeterminate preoperative risk 2. COPD 3. 40-pack-year smoker-ongoing 4. Murmur 5. Dyslipidemia 6. Coronary artery calcification  PLAN: 1.   [name] has a significant smoking history and is considering spine surgery.  She has COPD and is found to have heart murmur.  She has had some dyspnea on exertion and is difficult to assess her exercise performance since she is not terribly active with her spinal disease.  In addition she has extensive coronary artery calcification and atherosclerosis seen on CT scan.  Based on these findings I recommend a Lexiscan Myoview for risk stratification.  We will also obtain an echocardiogram to evaluate her aortic murmur.  Her aortic valve was also noted to be calcified on CT.  We will also obtain a fasting lipid profile for further risk stratification and would likely recommend starting statin therapy.  She is not yet selected a surgeon for her spinal disease and I recommended either Dr. Kary Kos or Dr. Sherley Bounds with neurosurgery.  Follow-up with me after the studies are completed.  Pixie Casino, MD, [facility] [place]  Director of the Advanced Lipid Disorders &  Cardiovascular Risk Reduction Clinic Diplomate of the American Board of Clinical Lipidology Attending Cardiologist  Direct Dial: [phone]  Fax: [phone]  Website:  [email or web address] 06/08/2017, 1:23 PM

## 2017-06-09 ENCOUNTER — Telehealth: Payer: Self-pay | Admitting: Internal Medicine

## 2017-06-09 DIAGNOSIS — E785 Hyperlipidemia, unspecified: Secondary | ICD-10-CM

## 2017-06-09 NOTE — Telephone Encounter (Signed)
Called patient to notify her that MD would like her to have her cholesterol checked. She would like to get this done the same day as her echo on 4/16. Lab ordered and appt scheduled. She is aware she needs to be fasting.

## 2017-06-10 ENCOUNTER — Ambulatory Visit: Payer: Medicare HMO | Admitting: Emergency Medicine

## 2017-06-16 ENCOUNTER — Other Ambulatory Visit: Payer: Medicare HMO | Admitting: *Deleted

## 2017-06-16 ENCOUNTER — Other Ambulatory Visit: Payer: Self-pay

## 2017-06-16 ENCOUNTER — Ambulatory Visit (HOSPITAL_COMMUNITY): Payer: Medicare HMO | Attending: Internal Medicine

## 2017-06-16 DIAGNOSIS — Z01810 Encounter for preprocedural cardiovascular examination: Secondary | ICD-10-CM

## 2017-06-16 DIAGNOSIS — R011 Cardiac murmur, unspecified: Secondary | ICD-10-CM | POA: Diagnosis present

## 2017-06-16 DIAGNOSIS — R0609 Other forms of dyspnea: Secondary | ICD-10-CM

## 2017-06-16 DIAGNOSIS — E785 Hyperlipidemia, unspecified: Secondary | ICD-10-CM | POA: Insufficient documentation

## 2017-06-16 LAB — LIPID PANEL
CHOL/HDL RATIO: 3.9 ratio (ref 0.0–4.4)
CHOLESTEROL TOTAL: 198 mg/dL (ref 100–199)
HDL: 51 mg/dL (ref >39)
LDL CALC: 123 mg/dL — AB (ref 0–99)
Triglycerides: 120 mg/dL (ref 0–149)
VLDL CHOLESTEROL CAL: 24 mg/dL (ref 5–40)

## 2017-06-18 ENCOUNTER — Telehealth (HOSPITAL_COMMUNITY): Payer: Self-pay

## 2017-06-18 NOTE — Telephone Encounter (Signed)
Encounter complete. 

## 2017-06-23 ENCOUNTER — Encounter (HOSPITAL_COMMUNITY): Payer: Medicare HMO

## 2017-06-23 ENCOUNTER — Ambulatory Visit (HOSPITAL_COMMUNITY)
Admission: RE | Admit: 2017-06-23 | Discharge: 2017-06-23 | Disposition: A | Discharge status: Home / Self Care | Payer: Medicare HMO | Source: Ambulatory Visit | Attending: Cardiology | Admitting: Cardiology

## 2017-06-23 DIAGNOSIS — R9439 Abnormal result of other cardiovascular function study: Secondary | ICD-10-CM | POA: Insufficient documentation

## 2017-06-23 DIAGNOSIS — Z01810 Encounter for preprocedural cardiovascular examination: Secondary | ICD-10-CM | POA: Diagnosis not present

## 2017-06-23 DIAGNOSIS — Z789 Other specified health status: Secondary | ICD-10-CM | POA: Diagnosis not present

## 2017-06-23 DIAGNOSIS — R0609 Other forms of dyspnea: Secondary | ICD-10-CM

## 2017-06-23 DIAGNOSIS — J449 Chronic obstructive pulmonary disease, unspecified: Secondary | ICD-10-CM

## 2017-06-23 DIAGNOSIS — F17200 Nicotine dependence, unspecified, uncomplicated: Secondary | ICD-10-CM

## 2017-06-23 LAB — MYOCARDIAL PERFUSION IMAGING
CHL CUP NUCLEAR SRS: 0
CHL CUP NUCLEAR SSS: 6
LV sys vol: 45 mL
LVDIAVOL: 116 mL (ref 46–106)
Peak HR: 84 {beats}/min
Rest HR: 64 {beats}/min
SDS: 6
TID: 1.01

## 2017-06-23 MED ORDER — TECHNETIUM TC 99M TETROFOSMIN IV KIT
30.8000 | PACK | Freq: Once | INTRAVENOUS | Status: AC | PRN
  Administered 2017-06-23: 30.8 via INTRAVENOUS
  Filled 2017-06-23: qty 31

## 2017-06-23 MED ORDER — TECHNETIUM TC 99M TETROFOSMIN IV KIT
10.7000 | PACK | Freq: Once | INTRAVENOUS | Status: AC | PRN
  Administered 2017-06-23: 10.7 via INTRAVENOUS
  Filled 2017-06-23: qty 11

## 2017-06-23 MED ORDER — REGADENOSON 0.4 MG/5ML IV SOLN
0.4000 mg | Freq: Once | INTRAVENOUS | Status: AC
  Administered 2017-06-23: 0.4 mg via INTRAVENOUS

## 2017-06-25 ENCOUNTER — Telehealth: Payer: Self-pay | Admitting: Internal Medicine

## 2017-06-25 MED ORDER — ATORVASTATIN CALCIUM 40 MG PO TABS: 40.0000 mg | ORAL_TABLET | Freq: Every day | ORAL | 3 refills | Status: DC

## 2017-06-25 NOTE — Telephone Encounter (Signed)
Patient called w/echo, stress test and lab results. She agrees w/plan to start lipitor 40mg  QHS. She has MD follow up on 5/21 (will provide lipid profile lab slip at this visit). She would like copy of her results mailed to her. She will need to find a different surgeon as her insurance will not allow her to see Dr. Rowe Pavy

## 2017-06-26 ENCOUNTER — Encounter: Payer: Self-pay | Admitting: Internal Medicine

## 2017-07-21 ENCOUNTER — Encounter: Payer: Self-pay | Admitting: Internal Medicine

## 2017-07-21 ENCOUNTER — Ambulatory Visit: Payer: Medicare HMO | Admitting: Internal Medicine

## 2017-07-21 VITALS — BP 124/64 | HR 79 | Ht 63.0 in | Wt 166.8 lb

## 2017-07-21 DIAGNOSIS — E785 Hyperlipidemia, unspecified: Secondary | ICD-10-CM

## 2017-07-21 DIAGNOSIS — J449 Chronic obstructive pulmonary disease, unspecified: Secondary | ICD-10-CM

## 2017-07-21 DIAGNOSIS — I350 Nonrheumatic aortic (valve) stenosis: Secondary | ICD-10-CM

## 2017-07-21 DIAGNOSIS — Z01810 Encounter for preprocedural cardiovascular examination: Secondary | ICD-10-CM

## 2017-07-21 MED ORDER — ROSUVASTATIN CALCIUM 20 MG PO TABS: 20.0000 mg | ORAL_TABLET | Freq: Every day | ORAL | 3 refills | Status: DC

## 2017-07-21 NOTE — Progress Notes (Signed)
OFFICE CONSULT NOTE  Chief Complaint:  Short of breath, preoperative clearance  Primary Care Physician: Jaquita Rector, PA-C  HPI:  Joann Ford is a 72 y.o. female who is being seen today for the evaluation of shortness of breath and preoperative clearance at the request of Hedgecock, Massie Kluver,*. This is a 72 year old female with a 40-pack-year smoking history, who continues to smoke, history of dyslipidemia, anxiety, arthritis and osteoporosis with marked thoracic kyphosis.  Recently she was evaluated for possible spine straightening surgery.  Due to insurance reason she canceled any further surgery options and recently was seen for preoperative clearance and management of COPD by Dr. Lamonte Sakai.  He felt that she was acceptable risk for surgery, but she is being seen now for cardiovascular risk assessment.  She had a CT scan of the thoracic and lumbar spine in November 2018.  This showed significant coronary artery calcification and aortic atherosclerosis.  She has history of dyslipidemia but is not currently on a statin medication.  Symptomatically, she gets short of breath with minimal exertion.  She is not able to do a lot of activity due to her back problem.  There is no significant family history of heart disease in her first-degree relatives.  Does report a history of heart murmur.  07/21/2017  Joann Ford returns today for follow-up.  She underwent Lexiscan Myoview as well as echocardiogram for evaluation of murmur and preoperative risk assessment for possible spinal surgery.  Her echocardiogram demonstrated normal LVEF of 55 to 60% however there is very mild stenosis of the aortic valve.  Her Myoview stress test was low risk however there was a very small area of apical inferior and apical lateral ischemia.  Since she has been asymptomatic, I would recommend treating this medically.  Overall she is acceptable risk for back surgery should she choose to go through that.  We did also  perform blood work including a lipid profile.  This demonstrated an LDL of 123.  She was then started on atorvastatin 40 mg daily.  Unfortunately, she says she was intolerant of it after only 2 days on the medicine.  She said she developed severe joint pains that did get better after stopping the medicine.  This does not seem typical of SAMS.  PMHx:  Past Medical History:  Diagnosis Date  . Anxiety   . Arthritis   . Chronic pain   . Hyperlipidemia   . Osteoporosis     Past Surgical History:  Procedure Laterality Date  . I and D of boil  1998, 1999   bilateral axilla    FAMHx:  Family History  Problem Relation Age of Onset  . Cancer Father        Jaw Cancer  . Pneumonia Mother   . Cancer Cousin        jaw  . Colon cancer Neg Hx   . Stomach cancer Neg Hx     SOCHx:   reports that she has been smoking cigarettes.  She started smoking about 52 years ago. She has a 40.00 pack-year smoking history. She has never used smokeless tobacco. She reports that she does not drink alcohol or use drugs.  ALLERGIES:  Allergies  Allergen Reactions  . Cefadroxil Swelling    Throat closes  . Ciprofloxacin     Throat closes  . Penicillins Swelling    throat closes    ROS: Pertinent items noted in HPI and remainder of comprehensive ROS otherwise negative.  HOME MEDS: Current  Outpatient Medications on File Prior to Visit  Medication Sig Dispense Refill  . albuterol (PROVENTIL) (2.5 MG/3ML) 0.083% nebulizer solution Take 3 mLs (2.5 mg total) by nebulization every 6 (six) hours as needed for wheezing or shortness of breath. 300 mL 5  . cholecalciferol (VITAMIN D) 1000 UNITS tablet Take 1,000 Units by mouth daily.      Marland Kitchen escitalopram (LEXAPRO) 20 MG tablet Take 1 tablet (20 mg total) by mouth daily. 30 tablet 11  . gabapentin (NEURONTIN) 100 MG capsule Take 300 mg by mouth daily.     Marland Kitchen morphine (MS CONTIN) 15 MG 12 hr tablet Take 15 mg by mouth 3 (three) times daily.     . Tiotropium  Bromide Monohydrate (SPIRIVA RESPIMAT) 2.5 MCG/ACT AERS Inhale 2 puffs into the lungs daily. 1 Inhaler 5  . atorvastatin (LIPITOR) 40 MG tablet Take 1 tablet (40 mg total) by mouth daily. (Patient not taking: Reported on 07/21/2017) 30 tablet 3   Current Facility-Administered Medications on File Prior to Visit  Medication Dose Route Frequency Provider Last Rate Last Dose  . 0.9 %  sodium chloride infusion  500 mL Intravenous Continuous Lafayette Dragon, MD        LABS/IMAGING: No results found for this or any previous visit (from the past 48 hour(s)). No results found.  LIPID PANEL:    Component Value Date/Time   CHOL 198 06/16/2017 1100   TRIG 120 06/16/2017 1100   HDL 51 06/16/2017 1100   CHOLHDL 3.9 06/16/2017 1100   CHOLHDL 3 09/29/2011 1120   VLDL 18.2 09/29/2011 1120   LDLCALC 123 (H) 06/16/2017 1100   LDLDIRECT 156.9 02/07/2008 1407    WEIGHTS: Wt Readings from Last 3 Encounters:  07/21/17 166 lb 12.8 oz (75.7 kg)  06/23/17 164 lb (74.4 kg)  06/08/17 164 lb 12.8 oz (74.8 kg)    VITALS: BP 124/64   Pulse 79   Ht 5\' 3"  (1.6 m)   Wt 166 lb 12.8 oz (75.7 kg)   BMI 29.55 kg/m   EXAM: Deferred  EKG: Deferred  ASSESSMENT: 1. Acceptable preoperative risk for spine surgery -low risk Myoview stress test with a small area of inferolateral reversible ischemia, LVEF 61% (06/2017) 2. COPD 3. 40-pack-year smoker-ongoing 4. Mild aortic stenosis (06/2017) 5. Dyslipidemia -possibly intolerant to atorvastatin 6. Coronary artery calcification  PLAN: 1.   Joann Ford had a low risk Myoview with a small amount of inferior and inferolateral ischemia.  We will plan to manage this medically and it may actually be artifactual.  She is at acceptable preoperative risk for spine surgery.  She has a significant smoking history and is considering smoking cessation, but was not interested in any medications or smoking aids today.  I again provided names of 2 neurosurgeons that I would  recommend since she does not seem to be interested in following up with her previous neurosurgeon.  She will need follow-up of her aortic stenosis with echo likely in 1 to 3 years or sooner if symptoms dictate.  She had possible intolerance to atorvastatin.  I have switched her to rosuvastatin 20 mg daily. We will plan a repeat lipid profile in about 3 months and adjust her medications accordingly.  Pixie Casino, MD, [facility], [place] Director of the [facility] Lipid Disorders &  Cardiovascular Risk Reduction Clinic Diplomate of the American Board of Clinical Lipidology Attending Cardiologist  Direct Dial: [phone]  Fax: [phone]  Website:  [email or web address]  Nadean Corwin Zaquan Duffner 07/21/2017, 9:07 AM

## 2017-07-21 NOTE — Patient Instructions (Signed)
Medication Instructions:   STOP atorvastatin START rosuvastatin (crestor) 20mg  daily  Labwork:  Your physician recommends that you return for lab work in: [place] (fasting) to check cholesterol   Testing/Procedures:  NONE  Follow-Up:  Your physician recommends that you schedule a follow-up appointment in [place] with Dr. Debara Pickett  Please call our office if: 1. You are unable to tolerate crestor after 2 weeks 2. You pass out  Neurosurgeons Dr. Kary Kos Dr. Sherley Bounds  If you need a refill on your cardiac medications before your next appointment, please call your pharmacy.  Any Other Special Instructions Will Be Listed Below (If Applicable).

## 2017-07-22 ENCOUNTER — Encounter: Payer: Self-pay | Admitting: Internal Medicine

## 2017-07-22 DIAGNOSIS — I350 Nonrheumatic aortic (valve) stenosis: Secondary | ICD-10-CM | POA: Insufficient documentation

## 2017-07-22 DIAGNOSIS — E785 Hyperlipidemia, unspecified: Secondary | ICD-10-CM | POA: Insufficient documentation

## 2017-10-27 ENCOUNTER — Ambulatory Visit: Payer: Medicare HMO | Admitting: Internal Medicine

## 2017-10-28 DIAGNOSIS — F324 Major depressive disorder, single episode, in partial remission: Secondary | ICD-10-CM | POA: Insufficient documentation

## 2018-02-05 ENCOUNTER — Emergency Department (HOSPITAL_BASED_OUTPATIENT_CLINIC_OR_DEPARTMENT_OTHER): Payer: Medicare HMO

## 2018-02-05 ENCOUNTER — Other Ambulatory Visit: Payer: Self-pay

## 2018-02-05 ENCOUNTER — Encounter (HOSPITAL_BASED_OUTPATIENT_CLINIC_OR_DEPARTMENT_OTHER): Payer: Self-pay

## 2018-02-05 ENCOUNTER — Inpatient Hospital Stay (HOSPITAL_BASED_OUTPATIENT_CLINIC_OR_DEPARTMENT_OTHER)
Admission: EM | Admit: 2018-02-05 | Discharge: 2018-02-10 | DRG: 308 | Disposition: A | Discharge status: Home Health | Payer: Medicare HMO | Attending: Internal Medicine | Admitting: Internal Medicine

## 2018-02-05 DIAGNOSIS — Z79899 Other long term (current) drug therapy: Secondary | ICD-10-CM | POA: Diagnosis not present

## 2018-02-05 DIAGNOSIS — E860 Dehydration: Secondary | ICD-10-CM | POA: Diagnosis not present

## 2018-02-05 DIAGNOSIS — J849 Interstitial pulmonary disease, unspecified: Secondary | ICD-10-CM | POA: Diagnosis present

## 2018-02-05 DIAGNOSIS — F17210 Nicotine dependence, cigarettes, uncomplicated: Secondary | ICD-10-CM | POA: Diagnosis not present

## 2018-02-05 DIAGNOSIS — I4819 Other persistent atrial fibrillation: Principal | ICD-10-CM | POA: Diagnosis present

## 2018-02-05 DIAGNOSIS — Z881 Allergy status to other antibiotic agents status: Secondary | ICD-10-CM

## 2018-02-05 DIAGNOSIS — R011 Cardiac murmur, unspecified: Secondary | ICD-10-CM | POA: Diagnosis not present

## 2018-02-05 DIAGNOSIS — J438 Other emphysema: Secondary | ICD-10-CM | POA: Diagnosis present

## 2018-02-05 DIAGNOSIS — R0600 Dyspnea, unspecified: Secondary | ICD-10-CM

## 2018-02-05 DIAGNOSIS — J449 Chronic obstructive pulmonary disease, unspecified: Secondary | ICD-10-CM | POA: Diagnosis not present

## 2018-02-05 DIAGNOSIS — Z888 Allergy status to other drugs, medicaments and biological substances status: Secondary | ICD-10-CM | POA: Diagnosis not present

## 2018-02-05 DIAGNOSIS — G894 Chronic pain syndrome: Secondary | ICD-10-CM | POA: Diagnosis not present

## 2018-02-05 DIAGNOSIS — I5041 Acute combined systolic (congestive) and diastolic (congestive) heart failure: Secondary | ICD-10-CM | POA: Diagnosis not present

## 2018-02-05 DIAGNOSIS — R0902 Hypoxemia: Secondary | ICD-10-CM | POA: Diagnosis present

## 2018-02-05 DIAGNOSIS — M40294 Other kyphosis, thoracic region: Secondary | ICD-10-CM | POA: Diagnosis present

## 2018-02-05 DIAGNOSIS — I4891 Unspecified atrial fibrillation: Secondary | ICD-10-CM

## 2018-02-05 DIAGNOSIS — R0609 Other forms of dyspnea: Secondary | ICD-10-CM

## 2018-02-05 DIAGNOSIS — Z880 Allergy status to penicillin: Secondary | ICD-10-CM | POA: Diagnosis not present

## 2018-02-05 DIAGNOSIS — I350 Nonrheumatic aortic (valve) stenosis: Secondary | ICD-10-CM | POA: Diagnosis not present

## 2018-02-05 DIAGNOSIS — Z720 Tobacco use: Secondary | ICD-10-CM | POA: Diagnosis not present

## 2018-02-05 DIAGNOSIS — M810 Age-related osteoporosis without current pathological fracture: Secondary | ICD-10-CM | POA: Diagnosis present

## 2018-02-05 DIAGNOSIS — I7090 Unspecified atherosclerosis: Secondary | ICD-10-CM | POA: Diagnosis present

## 2018-02-05 DIAGNOSIS — E785 Hyperlipidemia, unspecified: Secondary | ICD-10-CM | POA: Diagnosis not present

## 2018-02-05 DIAGNOSIS — I4811 Longstanding persistent atrial fibrillation: Secondary | ICD-10-CM | POA: Insufficient documentation

## 2018-02-05 DIAGNOSIS — M419 Scoliosis, unspecified: Secondary | ICD-10-CM | POA: Diagnosis present

## 2018-02-05 DIAGNOSIS — F419 Anxiety disorder, unspecified: Secondary | ICD-10-CM | POA: Diagnosis not present

## 2018-02-05 DIAGNOSIS — Z87891 Personal history of nicotine dependence: Secondary | ICD-10-CM | POA: Diagnosis present

## 2018-02-05 DIAGNOSIS — M1990 Unspecified osteoarthritis, unspecified site: Secondary | ICD-10-CM | POA: Diagnosis not present

## 2018-02-05 LAB — BASIC METABOLIC PANEL
Anion gap: 9 (ref 5–15)
BUN: 10 mg/dL (ref 8–23)
CALCIUM: 9.2 mg/dL (ref 8.9–10.3)
CO2: 29 mmol/L (ref 22–32)
Chloride: 102 mmol/L (ref 98–111)
Creatinine, Ser: 0.7 mg/dL (ref 0.44–1.00)
GFR calc Af Amer: >60 mL/min (ref >60)
GFR calc non Af Amer: >60 mL/min (ref >60)
Glucose, Bld: 128 mg/dL — ABNORMAL HIGH (ref 70–99)
Potassium: 3.8 mmol/L (ref 3.5–5.1)
Sodium: 140 mmol/L (ref 135–145)

## 2018-02-05 LAB — CBC WITH DIFFERENTIAL/PLATELET
Abs Immature Granulocytes: 0.02 10*3/uL (ref 0.00–0.07)
Basophils Absolute: 0 10*3/uL (ref 0.0–0.1)
Basophils Relative: 1 %
Eosinophils Absolute: 0 10*3/uL (ref 0.0–0.5)
Eosinophils Relative: 0 %
HCT: 45.1 % (ref 36.0–46.0)
Hemoglobin: 14.1 g/dL (ref 12.0–15.0)
Immature Granulocytes: 0 %
Lymphocytes Relative: 11 %
Lymphs Abs: 0.7 10*3/uL (ref 0.7–4.0)
MCH: 30 pg (ref 26.0–34.0)
MCHC: 31.3 g/dL (ref 30.0–36.0)
MCV: 96 fL (ref 80.0–100.0)
Monocytes Absolute: 0.5 10*3/uL (ref 0.1–1.0)
Monocytes Relative: 8 %
Neutro Abs: 5.3 10*3/uL (ref 1.7–7.7)
Neutrophils Relative %: 80 %
Platelets: 155 10*3/uL (ref 150–400)
RBC: 4.7 MIL/uL (ref 3.87–5.11)
RDW: 14.6 % (ref 11.5–15.5)
WBC: 6.6 10*3/uL (ref 4.0–10.5)
nRBC: 0 % (ref 0.0–0.2)

## 2018-02-05 LAB — TROPONIN I: Troponin I: 0.03 ng/mL (ref <0.03)

## 2018-02-05 LAB — BRAIN NATRIURETIC PEPTIDE: B Natriuretic Peptide: 847.2 pg/mL — ABNORMAL HIGH (ref 0.0–100.0)

## 2018-02-05 LAB — MRSA PCR SCREENING: MRSA by PCR: NEGATIVE

## 2018-02-05 MED ORDER — ORAL CARE MOUTH RINSE
15.0000 mL | Freq: Two times a day (BID) | OROMUCOSAL | Status: DC
  Administered 2018-02-05 – 2018-02-07 (×4): 15 mL via OROMUCOSAL

## 2018-02-05 MED ORDER — METOPROLOL TARTRATE 25 MG PO TABS
25.0000 mg | ORAL_TABLET | Freq: Two times a day (BID) | ORAL | Status: DC
  Administered 2018-02-05 – 2018-02-07 (×3): 25 mg via ORAL
  Filled 2018-02-05 (×4): qty 1

## 2018-02-05 MED ORDER — VITAMIN D 25 MCG (1000 UNIT) PO TABS: 1000.0000 [IU] | ORAL_TABLET | Freq: Every day | ORAL | Status: DC

## 2018-02-05 MED ORDER — GABAPENTIN 300 MG PO CAPS
300.0000 mg | ORAL_CAPSULE | Freq: Every day | ORAL | Status: DC
  Administered 2018-02-06 – 2018-02-10 (×5): 300 mg via ORAL
  Filled 2018-02-05 (×5): qty 1

## 2018-02-05 MED ORDER — DILTIAZEM LOAD VIA INFUSION
10.0000 mg | Freq: Once | INTRAVENOUS | Status: AC
  Administered 2018-02-05: 10 mg via INTRAVENOUS
  Filled 2018-02-05: qty 10

## 2018-02-05 MED ORDER — MORPHINE SULFATE ER 15 MG PO TBCR
15.0000 mg | EXTENDED_RELEASE_TABLET | Freq: Three times a day (TID) | ORAL | Status: DC
  Administered 2018-02-05 – 2018-02-10 (×14): 15 mg via ORAL
  Filled 2018-02-05 (×14): qty 1

## 2018-02-05 MED ORDER — ONDANSETRON HCL 4 MG/2ML IJ SOLN
4.0000 mg | Freq: Once | INTRAMUSCULAR | Status: AC
  Administered 2018-02-05: 4 mg via INTRAVENOUS
  Filled 2018-02-05: qty 2

## 2018-02-05 MED ORDER — UMECLIDINIUM BROMIDE 62.5 MCG/INH IN AEPB
1.0000 | INHALATION_SPRAY | Freq: Every day | RESPIRATORY_TRACT | Status: DC
  Administered 2018-02-06 – 2018-02-10 (×5): 1 via RESPIRATORY_TRACT
  Filled 2018-02-05: qty 7

## 2018-02-05 MED ORDER — ACETAMINOPHEN 325 MG PO TABS
650.0000 mg | ORAL_TABLET | ORAL | Status: DC | PRN
  Administered 2018-02-05 – 2018-02-09 (×5): 650 mg via ORAL
  Filled 2018-02-05 (×5): qty 2

## 2018-02-05 MED ORDER — APIXABAN 5 MG PO TABS
5.0000 mg | ORAL_TABLET | Freq: Two times a day (BID) | ORAL | Status: DC
  Administered 2018-02-05 – 2018-02-10 (×10): 5 mg via ORAL
  Filled 2018-02-05 (×10): qty 1

## 2018-02-05 MED ORDER — ROSUVASTATIN CALCIUM 20 MG PO TABS: 20.0000 mg | ORAL_TABLET | Freq: Every day | ORAL | Status: DC

## 2018-02-05 MED ORDER — TIOTROPIUM BROMIDE MONOHYDRATE 2.5 MCG/ACT IN AERS: 2.0000 | INHALATION_SPRAY | Freq: Every day | RESPIRATORY_TRACT | Status: DC

## 2018-02-05 MED ORDER — DILTIAZEM HCL 100 MG IV SOLR
5.0000 mg/h | INTRAVENOUS | Status: DC
  Administered 2018-02-05: 5 mg/h via INTRAVENOUS
  Administered 2018-02-05: 15 mg/h via INTRAVENOUS
  Filled 2018-02-05 (×2): qty 100

## 2018-02-05 MED ORDER — MORPHINE SULFATE CR 15 MG PO TB12: 15.0000 mg | ORAL_TABLET | Freq: Three times a day (TID) | ORAL | Status: DC

## 2018-02-05 MED ORDER — MORPHINE SULFATE ER 15 MG PO TBCR: 15.0000 mg | EXTENDED_RELEASE_TABLET | Freq: Three times a day (TID) | ORAL | Status: DC

## 2018-02-05 MED ORDER — DILTIAZEM HCL 100 MG IV SOLR
INTRAVENOUS | Status: AC
  Filled 2018-02-05: qty 100

## 2018-02-05 MED ORDER — METOPROLOL TARTRATE 5 MG/5ML IV SOLN
2.5000 mg | INTRAVENOUS | Status: AC
  Filled 2018-02-05: qty 5

## 2018-02-05 MED ORDER — ENOXAPARIN SODIUM 80 MG/0.8ML ~~LOC~~ SOLN
1.0000 mg/kg | Freq: Two times a day (BID) | SUBCUTANEOUS | Status: DC
  Filled 2018-02-05: qty 0.8

## 2018-02-05 MED ORDER — ESCITALOPRAM OXALATE 10 MG PO TABS
20.0000 mg | ORAL_TABLET | Freq: Every day | ORAL | Status: DC
  Administered 2018-02-06 – 2018-02-10 (×5): 20 mg via ORAL
  Filled 2018-02-05 (×5): qty 2

## 2018-02-05 MED ORDER — ONDANSETRON HCL 4 MG/2ML IJ SOLN: 4.0000 mg | Freq: Four times a day (QID) | INTRAMUSCULAR | Status: DC | PRN

## 2018-02-05 MED ORDER — ALBUTEROL SULFATE (2.5 MG/3ML) 0.083% IN NEBU: 2.5000 mg | INHALATION_SOLUTION | Freq: Four times a day (QID) | RESPIRATORY_TRACT | Status: DC | PRN

## 2018-02-05 NOTE — Progress Notes (Signed)
72 year old female with history of AV stenosis presenting with 3 days history of weakness and shortness of breath.  New A. fib with RVR, interstitial lung disease by chest x-ray.  Started on Cardizem drip and transferred to our facility

## 2018-02-05 NOTE — ED Notes (Signed)
Pt requesting something for nausea; sts she has been nauseous since last night

## 2018-02-05 NOTE — Consult Note (Addendum)
Cardiology Consultation:   Patient ID: DAFFNEY GREENLY MRN: [id number]; DOB: Jul 01, 1945  Admit date: 02/05/2018 Date of Consult: 02/05/2018  Primary Care Provider: Jaquita Rector, PA-C Primary Cardiologist: Pixie Casino, MD   Patient Profile:   Joann Ford is a 72 y.o. female with a hx of hyperlipidemia, COPD followed by Dr. Lamonte Sakai, Thoracics kyphosis, anxiety, arthritis, arteriosclerosis and ongoing tobacco smoking with 40-pack-year history who is being seen today for the evaluation of atrial fibrillation with rapid ventricular rate at the request of Dr. Waldron Labs.   Patient was seen by Dr. Debara Pickett 07/2017 for preoperative clearance for spine surgery. Her echocardiogram demonstrated normal LVEF of 55 to 60% however there is very mild stenosis of the aortic valve.  Her Myoview stress test was low risk however there was a very small area of apical inferior and apical lateral ischemia.  Since she has been asymptomatic, recommend treating this medically.  He was cleared for surgery but never done.  History of Present Illness:   Ms. Ford was in usual state of health up until last week when started to notice intermittent palpitation with associated worsening shortness of breath and dizziness.  Her palpitation lasted for 15 to 20 minutes with self resolution at times.  Last night she had a worsening episode associated with nausea and dizziness.  Her palpitation persisted and came to ER this morning for further evaluation.  She has chronic stable shortness of breath due to ongoing tobacco smoking.  Currently smokes 1-1/2 a pack a day.  In past week, she has noted intermittent orthopnea, lower extremity edema without syncope or melena.  No regular exercise.  She works few hours 2 times per week.  Might have noted chest discomfort but unsure.  Denies fever, chills, cough or congestion.  In ER, she was noted in atrial fibrillation with rapid ventricular rate.  Troponin 0.03.  BNP 847.   Electrolyte and serum creatinine normal.  Chest x-ray shows chronic interstitial lung disease with superimposed pulmonary edema.  She was started on IV Cardizem with improved heart rate to 110s from 140s.  Past Medical History:  Diagnosis Date  . Anxiety   . Arthritis   . Chronic pain   . Hyperlipidemia   . Osteoporosis     Past Surgical History:  Procedure Laterality Date  . I and D of boil  1998, 1999   bilateral axilla     Inpatient Medications: Scheduled Meds: . cholecalciferol  1,000 Units Oral Daily  . diltiazem      . enoxaparin (LOVENOX) injection  1 mg/kg Subcutaneous Q12H  . escitalopram  20 mg Oral Daily  . gabapentin  300 mg Oral Daily  . mouth rinse  15 mL Mouth Rinse BID  . metoprolol tartrate  25 mg Oral BID  . morphine  15 mg Oral TID  . rosuvastatin  20 mg Oral Daily  . Tiotropium Bromide Monohydrate  2 puff Inhalation Daily   Continuous Infusions: . diltiazem (CARDIZEM) infusion 15 mg/hr (02/05/18 1353)   PRN Meds: acetaminophen, albuterol, ondansetron (ZOFRAN) IV  Allergies:    Allergies  Allergen Reactions  . Cefadroxil Swelling    Throat closes  . Atorvastatin Other (See Comments)    Possible SAMS - 40 mg dose  . Ciprofloxacin     Throat closes  . Penicillins Swelling    throat closes    Social History:   Social History   Socioeconomic History  . Marital status: Married    Spouse name: Not on  file  . Number of children: Not on file  . Years of education: Not on file  . Highest education level: Not on file  Occupational History  . Occupation: Emergency planning/management officer  Social Needs  . Financial resource strain: Not on file  . Food insecurity:    Worry: Not on file    Inability: Not on file  . Transportation needs:    Medical: Not on file    Non-medical: Not on file  Tobacco Use  . Smoking status: Current Every Day Smoker    Packs/day: 1.00    Years: 40.00    Pack years: 40.00    Types: Cigarettes    Start date: 03/15/1965  . Smokeless  tobacco: Never Used  Substance and Sexual Activity  . Alcohol use: No  . Drug use: No  . Sexual activity: Not on file  Lifestyle  . Physical activity:    Days per week: Not on file    Minutes per session: Not on file  . Stress: Not on file  Relationships  . Social connections:    Talks on phone: Not on file    Gets together: Not on file    Attends religious service: Not on file    Active member of club or organization: Not on file    Attends meetings of clubs or organizations: Not on file    Relationship status: Not on file  . Intimate partner violence:    Fear of current or ex partner: Not on file    Emotionally abused: Not on file    Physically abused: Not on file    Forced sexual activity: Not on file  Other Topics Concern  . Not on file  Social History Narrative   Exercise--  walk    Family History:   Family History  Problem Relation Age of Onset  . Cancer Father        Jaw Cancer  . Pneumonia Mother   . Cancer Cousin        jaw  . Colon cancer Neg Hx   . Stomach cancer Neg Hx      ROS:  Please see the history of present illness.  All other ROS reviewed and negative.     Physical Exam/Data:   Vitals:   02/05/18 1530 02/05/18 1537 02/05/18 1652 02/05/18 1700  BP:  121/88 120/68 119/61  Pulse: (!) 160 (!) 127 (!) 130   Resp: 16 19  19   Temp:   98.3 F (36.8 C)   TempSrc:   Oral   SpO2: 92% 93% 96%   Weight:    76.6 kg  Height:    5\' 6"  (1.676 m)   No intake or output data in the 24 hours ending 02/05/18 1757 Filed Weights   02/05/18 1130 02/05/18 1700  Weight: 76.2 kg 76.6 kg   Body mass index is 27.26 kg/m.  General:  Well nourished, well developed, in no acute distress HEENT: normal Lymph: no adenopathy Neck: no JVD Endocrine:  No thryomegaly Vascular: No carotid bruits; FA pulses 2+ bilaterally without bruits  Cardiac:  normal S1, S2; irregularly irregular tachycardic, 2/6 systolic murmur Lungs:  clear to auscultation bilaterally, no  wheezing, rhonchi or rales  Abd: soft, nontender, no hepatomegaly  Ext: Trace edema with venous stasis changes Musculoskeletal:  No deformities, BUE and BLE strength normal and equal Skin: warm and dry  Neuro:  CNs 2-12 intact, no focal abnormalities noted Psych:  Normal affect   EKG:  The EKG was  personally reviewed and demonstrates: Atrial fibrillation at rate of 140 bpm Telemetry:  Telemetry was personally reviewed and demonstrates: Fibrillation at rate of 110-120s  Relevant CV Studies:  Stress test 06/2017  The left ventricular ejection fraction is normal (55-65%).  Nuclear stress EF: 61%.  There was no ST segment deviation noted during stress.  Defect 1: There is a small defect of severe severity present in the apical inferior, apical lateral and apex location.   Abnormal, low risk stress nuclear study with mild ischemia in the distal inferolateral wall/apex; EF 61 with normal wall motion.  Echo 06/2017 Study Conclusions  - Left ventricle: The cavity size was normal. Wall thickness was   increased in a pattern of moderate LVH. Systolic function was   normal. The estimated ejection fraction was in the range of 55%   to 60%. Wall motion was normal; there were no regional wall   motion abnormalities. Left ventricular diastolic function   parameters were normal. - Aortic valve: There was mild stenosis. There was trivial   regurgitation. - Left atrium: The atrium was mildly dilated. - Atrial septum: No defect or patent foramen ovale was identified.  Laboratory Data:  Chemistry Recent Labs  Lab 02/05/18 1143  NA 140  K 3.8  CL 102  CO2 29  GLUCOSE 128*  BUN 10  CREATININE 0.70  CALCIUM 9.2  GFRNONAA >60  GFRAA >60  ANIONGAP 9    No results for input(s): PROT, ALBUMIN, AST, ALT, ALKPHOS, BILITOT in the last 168 hours. Hematology Recent Labs  Lab 02/05/18 1143  WBC 6.6  RBC 4.70  HGB 14.1  HCT 45.1  MCV 96.0  MCH 30.0  MCHC 31.3  RDW 14.6  PLT 155    Cardiac Enzymes Recent Labs  Lab 02/05/18 1143  TROPONINI 0.03*   No results for input(s): TROPIPOC in the last 168 hours.  BNP Recent Labs  Lab 02/05/18 1143  BNP 847.2*    DDimer No results for input(s): DDIMER in the last 168 hours.  Radiology/Studies:  Dg Chest 2 View  Result Date: 02/05/2018 CLINICAL DATA:  Shortness of breath for 5 days. EXAM: CHEST - 2 VIEW COMPARISON:  07/10/2014 FINDINGS: Marked thoracolumbar scoliosis distorts cardio thoracic anatomy. Cardiopericardial silhouette is at upper limits of normal for size. Underlying diffuse chronic interstitial lung disease evident. Patchy opacity at the right base may be atelectasis or infiltrate. Lucency under the right hemidiaphragm is probably related to gaseous bowel distention. Bones are diffusely demineralized. Telemetry leads overlie the chest.The heart size and mediastinal contours are within normal limits. Both lungs are clear. The visualized skeletal structures are unremarkable. IMPRESSION: 1. Underlying diffuse interstitial lung disease, likely chronic. Component of superimposed interstitial edema cannot be excluded. 2. Right base atelectasis or infiltrate. 3. Lucency under the right hemidiaphragm is probably related to gaseous distention of bowel. If there is clinical concern for hollow viscus perforation, decubitus films recommended to further evaluate. Electronically Signed   By: Misty Stanley M.D.   On: 02/05/2018 12:32    Assessment and Plan:   1.  New onset atrial fibrillation with rapid ventricular rate -On and off palpitation lasting for 15 minutes at a time for past 1 week.  Current episode sustained since last night.  Heart rate now improved to 110s from 140s on IV Cardizem. CHADSVASC score of 2 for age and sex. She is on anticoagulation dose of Enoxaparin per pharmacy. Will Dc (did not got any dose) and start Eliquis 5mg  BID.  If she  does not convert by herself, will plan TEE cardioversion on Monday otherwise 3  weeks of anticoagulation and outpatient cardioversion based on symptoms.  Low-dose beta-blocker started by primary team.  Will not repeat echocardiogram or stress test.  2.  Elevated BNP -Likely due to elevated rate.  Lower extremity edema.  Echocardiogram 06/2017 showed normal LV  and diastolic function. Will follow clinically.   3.  COPD -She is on Spiriva at home concern regarding compliance.  Per primary team.  4. Tobacco abuse - Advised cessation  5. HLD - Continue statin   For questions or updates, please contact [place] Please consult [email or web address] for contact info under     SignedLeanor Kail, PA  02/05/2018 5:57 PM   ---------------------------------------------------------------------------------------------   History and all data above reviewed.  Patient examined.  I agree with the findings as above.  KAMYRAH FEESER is a pleasant 72 year old female with newly diagnosed atrial fibrillation. She feels better now than on presentation. She is on a diltiazem infusion for rate control.   She does not drink alcohol, does not snore often, and drinks 24 oz of coke a day. Stays dehydrated per her report.   Constitutional: No acute distress Eyes: pupils equally round and reactive to light, sclera non-icteric, normal conjunctiva and lids ENMT: normal dentition, moist mucous membranes Cardiovascular: irregular rhythm, tachycardic, no murmurs. S1 and S2 normal. Radial pulses normal bilaterally. No jugular venous distention.  Respiratory: clear to auscultation bilaterally GI : normal bowel sounds, soft and nontender. No distention.   MSK: extremities warm, well perfused. No edema.  NEURO: grossly nonfocal exam, moves all extremities. PSYCH: alert and oriented x 3, normal mood and affect.   All available labs, radiology testing, previous records reviewed. Agree with documented assessment and plan of my colleague as stated above with the following additions or  changes:  Active Problems:   Hyperlipemia   DOE (dyspnea on exertion)   Tobacco abuse   Chronic obstructive pulmonary disease (HCC)   Aortic valve stenosis   Atrial fibrillation with RVR (HCC)    Plan: continue rate control with diltiazem for rate control. Will initiate eliquis. I have counseled the patient on [facility], and she her family understand the risks and benefits. Can consider transition to oral rate control meds when HR is improved.   Encouraged adequate hydration at home.   TSH pending.   Length of Stay:  LOS: 1 day   Elouise Munroe, MD [facility] 5:04 AM  02/06/2018

## 2018-02-05 NOTE — ED Provider Notes (Signed)
[facility] EMERGENCY DEPARTMENT Provider Note   CSN: [id number] Arrival date & time: 02/05/18  1118     History   Chief Complaint Chief Complaint  Patient presents with  . Fatigue    HPI Joann Ford is a 72 y.o. female.  Patient is a 72 year old female with past medical history of scoliosis, aortic valve stenosis.  She presents today for evaluation of shortness of breath.  This is worsened over the past 3 days.  She denies any chest pain, fevers, chills, or productive cough.  Her breathing is worse when she attempts to lie flat and ambulate.  She denies to me she has had any prior cardiac history.  The history is provided by the patient.    Past Medical History:  Diagnosis Date  . Anxiety   . Arthritis   . Chronic pain   . Hyperlipidemia   . Osteoporosis     Patient Active Problem List   Diagnosis Date Noted  . Dyslipidemia 07/22/2017  . Aortic valve stenosis 07/22/2017  . Pre-operative cardiovascular examination 06/08/2017  . Murmur 06/08/2017  . Chronic obstructive pulmonary disease ([place]) 03/11/2017  . HYPOKALEMIA 12/18/2008  . DYSPEPSIA 12/18/2008  . POSTMENOPAUSAL STATUS 12/18/2008  . SINUSITIS - ACUTE-NOS 11/21/2008  . ACUTE BRONCHITIS 11/21/2008  . CERUMEN IMPACTION, BILATERAL 09/19/2008  . ELEVATED BP READING WITHOUT DX HYPERTENSION 03/06/2008  . HYPERTENSION 02/07/2008  . EDEMA 10/14/2007  . DOE (dyspnea on exertion) 10/14/2007  . SPINAL STENOSIS, LUMBAR 09/28/2007  . Tobacco abuse 11/23/2006  . B12 DEFICIENCY 08/17/2006  . HYPERLIPIDEMIA 08/17/2006  . ANXIETY 08/17/2006  . SCOLIOSIS, PROGRESSIVE IDIOPATHIC 08/17/2006  . OBESITY NOS 04/01/2006  . PANIC ATTACK 04/01/2006    Past Surgical History:  Procedure Laterality Date  . I and D of boil  1998, 1999   bilateral axilla     OB History   None      Home Medications    Prior to Admission medications   Medication Sig Start Date End Date Taking? Authorizing Provider    albuterol (PROVENTIL) (2.5 MG/3ML) 0.083% nebulizer solution Take 3 mLs (2.5 mg total) by nebulization every 6 (six) hours as needed for wheezing or shortness of breath. 05/18/17   Collene Gobble, MD  cholecalciferol (VITAMIN D) 1000 UNITS tablet Take 1,000 Units by mouth daily.      [provider]  escitalopram (LEXAPRO) 20 MG tablet Take 1 tablet (20 mg total) by mouth daily. 09/29/11   Ann Held, DO  gabapentin (NEURONTIN) 100 MG capsule Take 300 mg by mouth daily.     [provider]  morphine (MS CONTIN) 15 MG 12 hr tablet Take 15 mg by mouth 3 (three) times daily.     [provider]  rosuvastatin (CRESTOR) 20 MG tablet Take 1 tablet (20 mg total) by mouth daily. 07/21/17 10/19/17  Pixie Casino, MD  Tiotropium Bromide Monohydrate (SPIRIVA RESPIMAT) 2.5 MCG/ACT AERS Inhale 2 puffs into the lungs daily. 05/18/17   Collene Gobble, MD    Family History Family History  Problem Relation Age of Onset  . Cancer Father        Jaw Cancer  . Pneumonia Mother   . Cancer Cousin        jaw  . Colon cancer Neg Hx   . Stomach cancer Neg Hx     Social History Social History   Tobacco Use  . Smoking status: Current Every Day Smoker    Packs/day: 1.00  Years: 40.00    Pack years: 40.00    Types: Cigarettes    Start date: 03/15/1965  . Smokeless tobacco: Never Used  Substance Use Topics  . Alcohol use: No  . Drug use: No     Allergies   Cefadroxil; Atorvastatin; Ciprofloxacin; and Penicillins   Review of Systems Review of Systems  All other systems reviewed and are negative.    Physical Exam Updated Vital Signs BP (!) 146/114 (BP Location: Left Arm)   Pulse (!) 155   Temp 98.8 F (37.1 C)   Resp (!) 24   Ht 5\' 6"  (1.676 m)   Wt 76.2 kg   BMI 27.12 kg/m   Physical Exam  Constitutional: She is oriented to person, place, and time. She appears well-developed and well-nourished. No distress.  HENT:  Head: Normocephalic and  atraumatic.  Neck: Normal range of motion. Neck supple.  Cardiovascular: Exam reveals no gallop and no friction rub.  No murmur heard. Heart is irregularly irregular and rapid.  Pulmonary/Chest: Effort normal and breath sounds normal. No respiratory distress. She has no wheezes.  Abdominal: Soft. Bowel sounds are normal. She exhibits no distension. There is no tenderness.  Musculoskeletal: Normal range of motion. She exhibits edema.  There is trace pitting edema of both lower extremities.  Neurological: She is alert and oriented to person, place, and time.  Skin: Skin is warm and dry. She is not diaphoretic.  Nursing note and vitals reviewed.    ED Treatments / Results  Labs (all labs ordered are listed, but only abnormal results are displayed) Labs Reviewed - No data to display  EKG EKG Interpretation  Date/Time:  Friday February 05 2018 11:37:58 EST Ventricular Rate:  140 PR Interval:    QRS Duration: 75 QT Interval:  335 QTC Calculation: 512 R Axis:   -25 Text Interpretation:  Atrial fibrillation with rapid V-rate Ventricular premature complex Borderline left axis deviation Borderline low voltage, extremity leads Borderline abnrm T, anterolateral leads No prior ecg for comparison Confirmed by Veryl Speak [phone]) on 02/05/2018 11:47:52 AM   Radiology No results found.  Procedures Procedures (including critical care time)  Medications Ordered in ED Medications - No data to display   Initial Impression / Assessment and Plan / ED Course  I have reviewed the triage vital signs and the nursing notes.  Pertinent labs & imaging results that were available during my care of the patient were reviewed by me and considered in my medical decision making (see chart for details).  Patient presenting here with shortness of breath that appears to be related to CHF and new onset of atrial fibrillation.  She is hypoxic with oxygen saturations in the upper 80s.  Remainder of the  work-up reveals no significant laboratory abnormalities with the exception of an elevated BNP.  She will be admitted to the hospitalist service for A. fib rate control and further work-up.  She was started on a Cardizem drip for rate control here in the ER.  CRITICAL CARE Performed by: Veryl Speak Total critical care time: 40 minutes Critical care time was exclusive of separately billable procedures and treating other patients. Critical care was necessary to treat or prevent imminent or life-threatening deterioration. Critical care was time spent personally by me on the following activities: development of treatment plan with patient and/or surrogate as well as nursing, discussions with consultants, evaluation of patient's response to treatment, examination of patient, obtaining history from patient or surrogate, ordering and performing treatments and interventions, ordering  and review of laboratory studies, ordering and review of radiographic studies, pulse oximetry and re-evaluation of patient's condition.   Final Clinical Impressions(s) / ED Diagnoses   Final diagnoses:  None    ED Discharge Orders    None       Veryl Speak, MD 02/05/18 1536

## 2018-02-05 NOTE — ED Triage Notes (Addendum)
Pt and husband state pt with fatigue, SOB, change in breathing pattern x 4- 5 days-pt with unsteady gait to triage-SOB-denies pain, fever and flu sx

## 2018-02-05 NOTE — Progress Notes (Signed)
ANTICOAGULATION CONSULT NOTE - Initial Consult  Pharmacy Consult for enoxaparin Indication: atrial fibrillation  Allergies  Allergen Reactions  . Cefadroxil Swelling    Throat closes  . Atorvastatin Other (See Comments)    Possible SAMS - 40 mg dose  . Ciprofloxacin     Throat closes  . Penicillins Swelling    throat closes    Patient Measurements: Height: 5\' 6"  (167.6 cm) Weight: 168 lb 14.4 oz (76.6 kg) IBW/kg (Calculated) : 59.3 Heparin Dosing Weight: 76kg  Vital Signs: Temp: 98.3 F (36.8 C) (12/06 1652) Temp Source: Oral (12/06 1652) BP: 119/61 (12/06 1700) Pulse Rate: 130 (12/06 1652)  Labs: Recent Labs    02/05/18 1143  HGB 14.1  HCT 45.1  PLT 155  CREATININE 0.70  TROPONINI 0.03*    Estimated Creatinine Clearance: 66.4 mL/min (by C-G formula based on SCr of 0.7 mg/dL).   Medical History: Past Medical History:  Diagnosis Date  . Anxiety   . Arthritis   . Chronic pain   . Hyperlipidemia   . Osteoporosis     Assessment: 67 yoF admitted with SOB found to have new AFib with RVR. Pharmacy to dose enoxaparin. CBC wnl, wt 76kg, no OAC noted PTA.  Goal of Therapy:  Anti-Xa level 0.6-1 units/ml 4hrs after LMWH dose given Monitor platelets by anticoagulation protocol: Yes   Plan:  Enoxaparin 1mg /kg SQ twice daily Monitor CBC, BMET  Arrie Senate, PharmD, BCPS Clinical Pharmacist [phone] Please check AMION for all [place] numbers 02/05/2018

## 2018-02-05 NOTE — H&P (Signed)
[facility] H&P   Patient Demographics:    Joann Ford, is a 72 y.o. female  MRN: [id number]   DOB - 1946/01/31  Admit Date - 02/05/2018  Outpatient Primary MD for the patient is Hedgecock, Massie Kluver, PA-C  Referring MD/NP/PA: Dr Stark Jock  Outpatient Specialists: Cardiolgoy Dr Debara Pickett  Patient coming from: Home  Chief Complaint  Patient presents with  . Fatigue      HPI:    Joann Ford  is a 72 y.o. female, with past medical history of aortic valve stenosis, hyperlipidemia, tobacco abuse, COPD, scoliosis, was seen recently by cardiology as an outpatient for preop evaluation for scoliosis surgery, low risk stress test, EF 55 to 60%, with very mild stenosis of the aortic valve, since with shortness of breath, reports this is progressive for last couple weeks, but significantly worsened over last 3 days, mainly exertional, as were reports some orthopnea, denies any chest pain, fever, chills, cough, hemoptysis, leg pain, leg swelling, he does report some palpitation. -In ED she was noted to be in A. fib with RVR, appears new onset, heart rate in the 140s to 150s, she required Cardizem drip, first troponin was negative, no significant lab abnormalities, TSH is pending, we were called to admit    Review of systems:    In addition to the HPI above,  No Fever-chills, He reports some headache, no changes with Vision or hearing, No problems swallowing food or Liquids, No Chest pain, Cough, reports some shortness of breath and palpitation no Abdominal pain, No Nausea or Vommitting, Bowel movements are regular, No Blood in stool or Urine, No dysuria, No new skin rashes or bruises, No new joints pains-aches,  No new weakness, tingling, numbness in any extremity, No recent weight gain or loss, No polyuria, polydypsia or polyphagia, No significant Mental Stressors.  A full 10 point Review of  Systems was done, except as stated above, all other Review of Systems were negative.   With Past History of the following :    Past Medical History:  Diagnosis Date  . Anxiety   . Arthritis   . Chronic pain   . Hyperlipidemia   . Osteoporosis       Past Surgical History:  Procedure Laterality Date  . I and D of boil  1998, 1999   bilateral axilla      Social History:     Social History   Tobacco Use  . Smoking status: Current Every Day Smoker    Packs/day: 1.00    Years: 40.00    Pack years: 40.00    Types: Cigarettes    Start date: 03/15/1965  . Smokeless tobacco: Never Used  Substance Use Topics  . Alcohol use: No     Lives -at home  Mobility -independent     Family History :     Family History  Problem Relation Age of Onset  .  Cancer Father        Jaw Cancer  . Pneumonia Mother   . Cancer Cousin        jaw  . Colon cancer Neg Hx   . Stomach cancer Neg Hx       Home Medications:   Prior to Admission medications   Medication Sig Start Date End Date Taking? Authorizing Provider  albuterol (PROVENTIL) (2.5 MG/3ML) 0.083% nebulizer solution Take 3 mLs (2.5 mg total) by nebulization every 6 (six) hours as needed for wheezing or shortness of breath. 05/18/17   Collene Gobble, MD  cholecalciferol (VITAMIN D) 1000 UNITS tablet Take 1,000 Units by mouth daily.      [provider]  escitalopram (LEXAPRO) 20 MG tablet Take 1 tablet (20 mg total) by mouth daily. 09/29/11   Ann Held, DO  gabapentin (NEURONTIN) 100 MG capsule Take 300 mg by mouth daily.     [provider]  morphine (MS CONTIN) 15 MG 12 hr tablet Take 15 mg by mouth 3 (three) times daily.     [provider]  rosuvastatin (CRESTOR) 20 MG tablet Take 1 tablet (20 mg total) by mouth daily. 07/21/17 10/19/17  Pixie Casino, MD  Tiotropium Bromide Monohydrate (SPIRIVA RESPIMAT) 2.5 MCG/ACT AERS Inhale 2 puffs into the lungs daily. 05/18/17   Collene Gobble,  MD     Allergies:     Allergies  Allergen Reactions  . Cefadroxil Swelling    Throat closes  . Atorvastatin Other (See Comments)    Possible SAMS - 40 mg dose  . Ciprofloxacin     Throat closes  . Penicillins Swelling    throat closes     Physical Exam:   Vitals  Blood pressure 119/61, pulse (!) 130, temperature 98.3 F (36.8 C), temperature source Oral, resp. rate 19, height 5\' 6"  (1.676 m), weight 76.6 kg, SpO2 96 %.   1. General developed female, laying in bed in no apparent distress, has significant scoliosis  2. Normal affect and insight, Not Suicidal or Homicidal, Awake Alert, Oriented X 3.  3. No F.N deficits, ALL C.Nerves Intact, Strength 5/5 all 4 extremities, Sensation intact all 4 extremities, Plantars down going.  4. Ears and Eyes appear Normal, Conjunctivae clear, PERRLA. Moist Oral Mucosa.  5. Supple Neck, No JVD, No cervical lymphadenopathy appriciated, No Carotid Bruits.  6. Symmetrical Chest wall movement, Good air movement bilaterally, CTAB.  7.  Irregular, irregular, tachycardic, systolic murmur, no rubs or gallops  8. Positive Bowel Sounds, Abdomen Soft, No tenderness, No organomegaly appriciated,No rebound -guarding or rigidity.  9.  No Cyanosis, Normal Skin Turgor, No Skin Rash or Bruise.  Has chronic lower extremity skin changes  10. Good muscle tone,  joints appear normal , no effusions, Normal ROM.  11. No Palpable Lymph Nodes in Neck or Axillae     Data Review:    CBC Recent Labs  Lab 02/05/18 1143  WBC 6.6  HGB 14.1  HCT 45.1  PLT 155  MCV 96.0  MCH 30.0  MCHC 31.3  RDW 14.6  LYMPHSABS 0.7  MONOABS 0.5  EOSABS 0.0  BASOSABS 0.0   ------------------------------------------------------------------------------------------------------------------  Chemistries  Recent Labs  Lab 02/05/18 1143  NA 140  K 3.8  CL 102  CO2 29  GLUCOSE 128*  BUN 10  CREATININE 0.70  CALCIUM 9.2    ------------------------------------------------------------------------------------------------------------------ estimated creatinine clearance is 66.4 mL/min (by C-G formula based on SCr of 0.7 mg/dL). ------------------------------------------------------------------------------------------------------------------ No results for  input(s): TSH, T4TOTAL, T3FREE, THYROIDAB in the last 72 hours.  Invalid input(s): FREET3  Coagulation profile No results for input(s): INR, PROTIME in the last 168 hours. ------------------------------------------------------------------------------------------------------------------- No results for input(s): DDIMER in the last 72 hours. -------------------------------------------------------------------------------------------------------------------  Cardiac Enzymes Recent Labs  Lab 02/05/18 1143  TROPONINI 0.03*   ------------------------------------------------------------------------------------------------------------------    Component Value Date/Time   BNP 847.2 (H) 02/05/2018 1143     ---------------------------------------------------------------------------------------------------------------  Urinalysis    Component Value Date/Time   COLORURINE lt. yellow 07/23/2009 0859   APPEARANCEUR Clear 07/23/2009 0859   LABSPEC <1.005 07/23/2009 0859   PHURINE 5.0 07/23/2009 0859   HGBUR negative 07/23/2009 0859   BILIRUBINUR Neg 09/29/2011 1505   PROTEINUR Neg 09/29/2011 1505   UROBILINOGEN 0.2 09/29/2011 1505   UROBILINOGEN 0.2 07/23/2009 0859   NITRITE Neg 09/29/2011 1505   NITRITE negative 07/23/2009 0859   LEUKOCYTESUR Negative 09/29/2011 1505    ----------------------------------------------------------------------------------------------------------------   Imaging Results:    Dg Chest 2 View  Result Date: 02/05/2018 CLINICAL DATA:  Shortness of breath for 5 days. EXAM: CHEST - 2 VIEW COMPARISON:  07/10/2014 FINDINGS:  Marked thoracolumbar scoliosis distorts cardio thoracic anatomy. Cardiopericardial silhouette is at upper limits of normal for size. Underlying diffuse chronic interstitial lung disease evident. Patchy opacity at the right base may be atelectasis or infiltrate. Lucency under the right hemidiaphragm is probably related to gaseous bowel distention. Bones are diffusely demineralized. Telemetry leads overlie the chest.The heart size and mediastinal contours are within normal limits. Both lungs are clear. The visualized skeletal structures are unremarkable. IMPRESSION: 1. Underlying diffuse interstitial lung disease, likely chronic. Component of superimposed interstitial edema cannot be excluded. 2. Right base atelectasis or infiltrate. 3. Lucency under the right hemidiaphragm is probably related to gaseous distention of bowel. If there is clinical concern for hollow viscus perforation, decubitus films recommended to further evaluate. Electronically Signed   By: Misty Stanley M.D.   On: 02/05/2018 12:32    My personal review of EKG: A. fib with RVR, Rate  140/min, QTc 512 , no Acute ST changes   Assessment & Plan:    Active Problems:   Hyperlipemia   DOE (dyspnea on exertion)   Tobacco abuse   Chronic obstructive pulmonary disease (HCC)   Aortic valve stenosis   Atrial fibrillation with RVR (HCC)  A. fib with RVR -A. fib appears to be new onset, rate uncontrolled in ED, currently on Cardizem drip, will start on metoprolol 25 mg oral twice daily, she had recent 2D echo in April 2019, I will not request new unless felt necessary by cardiology, her chads 2 vas 2 score is  At least  2(female, older than 65), so I will start on Lovenox treatment dose tonight, can be transitioned to oral agent in a.m.Marland Kitchen -Check TSH -Cardiology to see.  COPD -No active wheezing, continue with home meds.  Aortic valve stenosis -Cardiology on board  Upper lipidemia -Continue with home dose statin  Tobacco  abuse -Reports she quit yesterday, will start on nicotine patch  Chronic pain syndrome -Continue with home meds   DVT Prophylaxis Lovenox (full dose)- SCDs   AM Labs Ordered, also please review Full Orders  Family Communication: Admission, patients condition and plan of care including tests being ordered have been discussed with the patient and husband who indicate understanding and agree with the plan and Code Status.  Code Status Full  Likely DC to  home  Condition GUARDED    Consults called: cariology  Admission status: OBSERVATION  Time spent in minutes : 50 MINUTES   Phillips Climes M.D on 02/05/2018 at 5:50 PM  Between 7am to 7pm - Pager - [phone]. After 7pm go to [email or web address] - password [facility]  [facility] - Office  [phone]

## 2018-02-06 DIAGNOSIS — Z720 Tobacco use: Secondary | ICD-10-CM | POA: Diagnosis not present

## 2018-02-06 DIAGNOSIS — I350 Nonrheumatic aortic (valve) stenosis: Secondary | ICD-10-CM

## 2018-02-06 DIAGNOSIS — J449 Chronic obstructive pulmonary disease, unspecified: Secondary | ICD-10-CM | POA: Diagnosis not present

## 2018-02-06 DIAGNOSIS — R7989 Other specified abnormal findings of blood chemistry: Secondary | ICD-10-CM | POA: Diagnosis not present

## 2018-02-06 DIAGNOSIS — I4891 Unspecified atrial fibrillation: Secondary | ICD-10-CM | POA: Diagnosis not present

## 2018-02-06 DIAGNOSIS — E782 Mixed hyperlipidemia: Secondary | ICD-10-CM

## 2018-02-06 LAB — CBC
HCT: 39.9 % (ref 36.0–46.0)
Hemoglobin: 12.2 g/dL (ref 12.0–15.0)
MCH: 29.5 pg (ref 26.0–34.0)
MCHC: 30.6 g/dL (ref 30.0–36.0)
MCV: 96.4 fL (ref 80.0–100.0)
Platelets: 129 10*3/uL — ABNORMAL LOW (ref 150–400)
RBC: 4.14 MIL/uL (ref 3.87–5.11)
RDW: 14.6 % (ref 11.5–15.5)
WBC: 5.9 10*3/uL (ref 4.0–10.5)
nRBC: 0 % (ref 0.0–0.2)

## 2018-02-06 LAB — BASIC METABOLIC PANEL
ANION GAP: 8 (ref 5–15)
BUN: 12 mg/dL (ref 8–23)
CO2: 31 mmol/L (ref 22–32)
Calcium: 8.7 mg/dL — ABNORMAL LOW (ref 8.9–10.3)
Chloride: 104 mmol/L (ref 98–111)
Creatinine, Ser: 1.07 mg/dL — ABNORMAL HIGH (ref 0.44–1.00)
GFR calc Af Amer: >60 mL/min (ref >60)
GFR calc non Af Amer: 52 mL/min — ABNORMAL LOW (ref >60)
Glucose, Bld: 105 mg/dL — ABNORMAL HIGH (ref 70–99)
Potassium: 3.7 mmol/L (ref 3.5–5.1)
Sodium: 143 mmol/L (ref 135–145)

## 2018-02-06 LAB — TSH: TSH: 1.577 u[IU]/mL (ref 0.350–4.500)

## 2018-02-06 MED ORDER — DILTIAZEM HCL 60 MG PO TABS
30.0000 mg | ORAL_TABLET | Freq: Four times a day (QID) | ORAL | Status: DC
  Administered 2018-02-06 – 2018-02-07 (×4): 30 mg via ORAL
  Filled 2018-02-06 (×4): qty 1

## 2018-02-06 NOTE — Discharge Instructions (Signed)

## 2018-02-06 NOTE — Progress Notes (Signed)
PROGRESS NOTE    Patient: Joann Ford                            PCP: Magda Kiel Duke, PA-C                    DOB: 11-Jan-1946            DOA: 02/05/2018 BPZ:[id number]             DOS: 02/06/2018, 4:30 PM   LOS: 1 day   Date of Service: The patient was seen and examined on 02/06/2018  Subjective:   The patient was seen and examined this morning, stable and not planing of any shortness of breath or chest pain.  Still on Cardizem drip, heart rate controlled, still in atrial fibrillation.  No issues overnight.  Stable  Brief Narrative:    Joann Ford  is a 72 y.o. female, with past medical history of aortic valve stenosis, hyperlipidemia, tobacco abuse, COPD, scoliosis, was seen recently by cardiology as an outpatient for preop evaluation for scoliosis surgery, low risk stress test, EF 55 to 60%, with very mild stenosis of the aortic valve, since with shortness of breath, reports this is progressive for last couple weeks, but significantly worsened over last 3 days, mainly exertional, as were reports some orthopnea, denies any chest pain, fever, chills, cough, hemoptysis, leg pain, leg swelling, he does report some palpitation.  -In ED she was noted to be in A. fib with RVR, appears new onset, heart rate in the 140s to 150s, she required Cardizem drip, first troponin was negative, no significant lab abnormalities, TSH is pending, we were called to admit   Active Problems:   Hyperlipemia   DOE (dyspnea on exertion)   Tobacco abuse   Chronic obstructive pulmonary disease (HCC)   Aortic valve stenosis   Atrial fibrillation with RVR ([place])    Assessment & Plan:    A. fib with RVR -She is stable not complaining of shortness of other chest pain, mildly elevated troponin 0 0.03 -Still on Cardizem drip -Heart rate improved, still in A. Fib -On metoprolol 25 mg p.o. once a day -Cardiology consulted: who initiated Eliquis, recommended to continue beta-blocker, anticipating  changing Cardizem to p.o. Today -2D echo April 2019 her chads 2 vas 2 score is  At least  2(female, older than 65),  -TSH normal at 1.57 -Should cardiology input  COPD -No active wheezing, continue with home meds.  Aortic valve stenosis -Cardiology on board  Upper lipidemia -Continue with home dose statin  Tobacco abuse -Reports she quit yesterday, will start on nicotine patch  Chronic pain syndrome -Continue with home meds     DVT prophylaxis: Patient on apixaban  Code Status:   Code Status: Full Code  Family Communication: Family member at bedside, plan of care discussed with the patient  Disposition Plan:   Anticipated 1-2 days  Consultants: Cardiology  Procedures:   No admission procedures for hospital encounter.     Antimicrobials:  Anti-infectives (From admission, onward)   None       Medication:  . apixaban  5 mg Oral BID  . diltiazem  30 mg Oral Q6H  . escitalopram  20 mg Oral Daily  . gabapentin  300 mg Oral Daily  . mouth rinse  15 mL Mouth Rinse BID  . metoprolol tartrate  25 mg Oral BID  . morphine  15 mg Oral TID  .  umeclidinium bromide  1 puff Inhalation Daily    acetaminophen, albuterol, ondansetron (ZOFRAN) IV     Objective:   Vitals:   02/06/18 0521 02/06/18 0723 02/06/18 0845 02/06/18 1121  BP: 121/84 108/64  120/70  Pulse: 93 85    Resp: 14 (!) 21    Temp: 98.3 F (36.8 C) 98.6 F (37 C)    TempSrc: Oral Oral    SpO2: 98% 98% 98%   Weight: 75.8 kg     Height:        Intake/Output Summary (Last 24 hours) at 02/06/2018 1630 Last data filed at 02/06/2018 1200 Gross per 24 hour  Intake 820.15 ml  Output 500 ml  Net 320.15 ml   Filed Weights   02/05/18 1130 02/05/18 1700 02/06/18 0521  Weight: 76.2 kg 76.6 kg 75.8 kg     Examination:    General exam: Appears calm and comfortable  BP 120/70   Pulse 85   Temp 98.6 F (37 C) (Oral)   Resp (!) 21   Ht 5\' 6"  (1.676 m)   Wt 75.8 kg   SpO2 98%   BMI  26.97 kg/m    Physical Exam  Constitution:  Alert, cooperative, no distress,  Psychiatric: Normal and stable mood and affect, cognition intact,   HEENT: Normocephalic, PERRL, otherwise with in Normal limits  Chest:Chest symmetric Cardio vascular:  S1/S2, RRR, No murmure, No Rubs or Gallops  pulmonary: Clear to auscultation bilaterally, respirations unlabored, negative wheezes / crackles Abdomen: Soft, non-tender, non-distended, bowel sounds,no masses, no organomegaly Muscular skeletal: Limited exam - in bed, able to move all 4 extremities, Normal strength,  Neuro: CNII-XII intact. , normal motor and sensation, reflexes intact  Extremities: No pitting edema lower extremities, +2 pulses  Skin: Dry, warm to touch, negative for any Rashes, No open wounds Wounds: per nursing documentation  LABs:  CBC Latest Ref Rng & Units 02/06/2018 02/05/2018 09/29/2011  WBC 4.0 - 10.5 K/uL 5.9 6.6 6.2  Hemoglobin 12.0 - 15.0 g/dL 12.2 14.1 14.2  Hematocrit 36.0 - 46.0 % 39.9 45.1 42.5  Platelets 150 - 400 K/uL 129(L) 155 146.0(L)   CMP Latest Ref Rng & Units 02/06/2018 02/05/2018 09/29/2011  Glucose 70 - 99 mg/dL 105(H) 128(H) 82  BUN 8 - 23 mg/dL 12 10 11   Creatinine 0.44 - 1.00 mg/dL 1.07(H) 0.70 0.7  Sodium 135 - 145 mmol/L 143 140 138  Potassium 3.5 - 5.1 mmol/L 3.7 3.8 3.8  Chloride 98 - 111 mmol/L 104 102 103  CO2 22 - 32 mmol/L 31 29 27   Calcium 8.9 - 10.3 mg/dL 8.7(L) 9.2 9.3  Total Protein 6.0 - 8.3 g/dL - - 6.8  Total Bilirubin 0.3 - 1.2 mg/dL - - 0.5  Alkaline Phos 39 - 117 U/L - - 54  AST 0 - 37 U/L - - 17  ALT 0 - 35 U/L - - 12

## 2018-02-06 NOTE — Progress Notes (Signed)
Progress Note  Patient Name: Joann Ford Date of Encounter: 02/06/2018  Primary Cardiologist: Pixie Casino, MD   Subjective   She is feeling much better and denies chest pain, palpitations, leg swelling, and shortness of breath.  Inpatient Medications    Scheduled Meds: . apixaban  5 mg Oral BID  . diltiazem  30 mg Oral Q6H  . escitalopram  20 mg Oral Daily  . gabapentin  300 mg Oral Daily  . mouth rinse  15 mL Mouth Rinse BID  . metoprolol tartrate  25 mg Oral BID  . morphine  15 mg Oral TID  . umeclidinium bromide  1 puff Inhalation Daily   Continuous Infusions:  PRN Meds: acetaminophen, albuterol, ondansetron (ZOFRAN) IV   Vital Signs    Vitals:   02/06/18 0350 02/06/18 0521 02/06/18 0723 02/06/18 0845  BP: 94/80 121/84 108/64   Pulse: 77 93 85   Resp: 15 14 (!) 21   Temp:  98.3 F (36.8 C) 98.6 F (37 C)   TempSrc:  Oral Oral   SpO2: 98% 98% 98% 98%  Weight:  75.8 kg    Height:        Intake/Output Summary (Last 24 hours) at 02/06/2018 1035 Last data filed at 02/06/2018 0900 Gross per 24 hour  Intake 499.9 ml  Output 500 ml  Net -0.1 ml   Filed Weights   02/05/18 1130 02/05/18 1700 02/06/18 0521  Weight: 76.2 kg 76.6 kg 75.8 kg    Telemetry    Controlled atrial fibrillation- Personally Reviewed  ECG    No new tracings- Personally Reviewed  Physical Exam   GEN: No acute distress.   Neck: No JVD Cardiac:  Irregular rhythm, heart rate is normal, no murmurs, rubs, or gallops.  Respiratory: Clear to auscultation bilaterally. GI: Soft, nontender, non-distended  MS: No edema; No deformity. Neuro:  Nonfocal  Psych: Normal affect   Labs    Chemistry Recent Labs  Lab 02/05/18 1143 02/06/18 0411  NA 140 143  K 3.8 3.7  CL 102 104  CO2 29 31  GLUCOSE 128* 105*  BUN 10 12  CREATININE 0.70 1.07*  CALCIUM 9.2 8.7*  GFRNONAA >60 52*  GFRAA >60 >60  ANIONGAP 9 8     Hematology Recent Labs  Lab 02/05/18 1143 02/06/18 0411    WBC 6.6 5.9  RBC 4.70 4.14  HGB 14.1 12.2  HCT 45.1 39.9  MCV 96.0 96.4  MCH 30.0 29.5  MCHC 31.3 30.6  RDW 14.6 14.6  PLT 155 129*    Cardiac Enzymes Recent Labs  Lab 02/05/18 1143  TROPONINI 0.03*   No results for input(s): TROPIPOC in the last 168 hours.   BNP Recent Labs  Lab 02/05/18 1143  BNP 847.2*     DDimer No results for input(s): DDIMER in the last 168 hours.   Radiology    Dg Chest 2 View  Result Date: 02/05/2018 CLINICAL DATA:  Shortness of breath for 5 days. EXAM: CHEST - 2 VIEW COMPARISON:  07/10/2014 FINDINGS: Marked thoracolumbar scoliosis distorts cardio thoracic anatomy. Cardiopericardial silhouette is at upper limits of normal for size. Underlying diffuse chronic interstitial lung disease evident. Patchy opacity at the right base may be atelectasis or infiltrate. Lucency under the right hemidiaphragm is probably related to gaseous bowel distention. Bones are diffusely demineralized. Telemetry leads overlie the chest.The heart size and mediastinal contours are within normal limits. Both lungs are clear. The visualized skeletal structures are unremarkable. IMPRESSION: 1. Underlying  diffuse interstitial lung disease, likely chronic. Component of superimposed interstitial edema cannot be excluded. 2. Right base atelectasis or infiltrate. 3. Lucency under the right hemidiaphragm is probably related to gaseous distention of bowel. If there is clinical concern for hollow viscus perforation, decubitus films recommended to further evaluate. Electronically Signed   By: Misty Stanley M.D.   On: 02/05/2018 12:32    Cardiac Studies   Stress test 06/2017  The left ventricular ejection fraction is normal (55-65%).  Nuclear stress EF: 61%.  There was no ST segment deviation noted during stress.  Defect 1: There is a small defect of severe severity present in the apical inferior, apical lateral and apex location.  Abnormal, low risk stress nuclear study with mild  ischemia in the distal inferolateral wall/apex; EF 61 with normal wall motion.  Echo 06/2017 Study Conclusions  - Left ventricle: The cavity size was normal. Wall thickness was increased in a pattern of moderate LVH. Systolic function was normal. The estimated ejection fraction was in the range of 55% to 60%. Wall motion was normal; there were no regional wall motion abnormalities. Left ventricular diastolic function parameters were normal. - Aortic valve: There was mild stenosis. There was trivial regurgitation. - Left atrium: The atrium was mildly dilated. - Atrial septum: No defect or patent foramen ovale was identified.  Patient Profile     72 y.o. female with a hx of hyperlipidemia, COPD followed by Dr. Lamonte Sakai, Thoracics kyphosis, anxiety, arthritis, arteriosclerosis and ongoing tobacco smoking with 40-pack-year history who is being seen today for the evaluation of atrial fibrillation with rapid ventricular rate at the request of Dr. Waldron Labs.   Assessment & Plan    1.  New onset atrial fibrillation with rapid ventricular rate -She is currently asymptomatic and heart rate is controlled on IV Cardizem.  I will switch IV diltiazem to oral diltiazem 30 mg every 6 hours.  She remains on Lopressor 25 mg twice daily initiated by internal medicine.  I would hope to consolidate medical therapy with long-acting diltiazem and hopefully stop metoprolol altogether.  CHADSVASC score of 2 for age and sex.   Continue Eliquis 5mg  BID.  If she does not convert by herself, will plan TEE cardioversion on Monday.  Otherwise we will plan for 3 weeks of anticoagulation and outpatient cardioversion based on symptoms.  Will not repeat echocardiogram or stress test.  2.  Elevated BNP -Likely due to elevated rate.  Lower extremity edema.  Echocardiogram 06/2017 showed normal LV  and diastolic function. Will follow clinically.   3.  COPD -She is on Spiriva at home concern regarding compliance.   Per primary team.  4. Tobacco abuse - Advised cessation  5. HLD - Continue statin    For questions or updates, please contact [place] Please consult [email or web address] for contact info under Cardiology/STEMI.      Signed, Kate Sable, MD  02/06/2018, 10:35 AM

## 2018-02-07 DIAGNOSIS — I4891 Unspecified atrial fibrillation: Secondary | ICD-10-CM | POA: Diagnosis not present

## 2018-02-07 DIAGNOSIS — E785 Hyperlipidemia, unspecified: Secondary | ICD-10-CM | POA: Diagnosis not present

## 2018-02-07 DIAGNOSIS — J449 Chronic obstructive pulmonary disease, unspecified: Secondary | ICD-10-CM | POA: Diagnosis not present

## 2018-02-07 DIAGNOSIS — Z720 Tobacco use: Secondary | ICD-10-CM | POA: Diagnosis not present

## 2018-02-07 DIAGNOSIS — I350 Nonrheumatic aortic (valve) stenosis: Secondary | ICD-10-CM | POA: Diagnosis not present

## 2018-02-07 DIAGNOSIS — E782 Mixed hyperlipidemia: Secondary | ICD-10-CM | POA: Diagnosis not present

## 2018-02-07 MED ORDER — OFF THE BEAT BOOK
Freq: Once | Status: AC
  Administered 2018-02-08: 04:00:00
  Filled 2018-02-07: qty 1

## 2018-02-07 MED ORDER — DILTIAZEM HCL ER COATED BEADS 120 MG PO CP24
120.0000 mg | ORAL_CAPSULE | Freq: Every day | ORAL | Status: DC
  Administered 2018-02-07 – 2018-02-08 (×2): 120 mg via ORAL
  Filled 2018-02-07 (×2): qty 1

## 2018-02-07 MED ORDER — MAGNESIUM HYDROXIDE 400 MG/5ML PO SUSP
30.0000 mL | Freq: Once | ORAL | Status: AC
  Administered 2018-02-08: 30 mL via ORAL
  Filled 2018-02-07: qty 30

## 2018-02-07 NOTE — Progress Notes (Signed)
PROGRESS NOTE    Patient: Joann Ford                            PCP: Magda Kiel Duke, PA-C                    DOB: [birth year]            DOA: 02/05/2018 DUK:[id number]             DOS: 02/07/2018, 3:20 PM   LOS: 1 day   Date of Service: The patient was seen and examined on 02/07/2018  Subjective:   The patient was seen and examined this morning, stable.  Not complaining of chest pain or shortness of breath.  Earlier this morning she had experienced brief episode of dizziness.  No other focal neurological findings. Reporting that her symptoms was brief and resolved.  Currently stable.  Brief Narrative:    Joann Ford  is a 72 y.o. female, with past medical history of aortic valve stenosis, hyperlipidemia, tobacco abuse, COPD, scoliosis, was seen recently by cardiology as an outpatient for preop evaluation for scoliosis surgery, low risk stress test, EF 55 to 60%, with very mild stenosis of the aortic valve, since with shortness of breath, reports this is progressive for last couple weeks, but significantly worsened over last 3 days, mainly exertional, as were reports some orthopnea, denies any chest pain, fever, chills, cough, hemoptysis, leg pain, leg swelling, he does report some palpitation.  -In ED she was noted to be in A. fib with RVR, appears new onset, heart rate in the 140s to 150s, she required Cardizem drip, first troponin was negative, no significant lab abnormalities, TSH is pending, we were called to admit   Active Problems:   Hyperlipemia   DOE (dyspnea on exertion)   Tobacco abuse   Chronic obstructive pulmonary disease (HCC)   Aortic valve stenosis   Atrial fibrillation with RVR ([place])    Assessment & Plan:    A. fib with RVR -Stable no complaints -on Cardizem drip >>> switched to p.o. Cardizem 120 mg daily short acting -Heart rate improved, still in A. Fib --Metoprolol was DC'd by cardiology -Cardiology  initiated Eliquis, recommended to  continue D/C  beta-blocker, changed Cardizem to p.o. -2D echo April 2019 her chads 2 vas 2 score is  At least  2(female, older than 65),  -TSH normal at 1.57 -Should cardiology input  -No plan for cardioversion, if remains stable likely be discharged in a.m.  Follow-up as an outpatient   COPD -No active wheezing, continue with home meds.  Aortic valve stenosis -Cardiology on board  Upper lipidemia -Continue with home dose statin  Tobacco abuse -Reports she quit yesterday, will start on nicotine patch  Chronic pain syndrome -Continue with home meds     DVT prophylaxis: Patient on apixaban  Code Status:   Code Status: Full Code  Family Communication: Family member at bedside, plan of care discussed with the patient  Disposition Plan:   Anticipated 1-2 days  Consultants: Cardiology  Procedures:   No admission procedures for hospital encounter.     Antimicrobials:  Anti-infectives (From admission, onward)   None       Medication:  . apixaban  5 mg Oral BID  . diltiazem  120 mg Oral Daily  . escitalopram  20 mg Oral Daily  . gabapentin  300 mg Oral Daily  . mouth rinse  15 mL Mouth  Rinse BID  . morphine  15 mg Oral TID  . umeclidinium bromide  1 puff Inhalation Daily    acetaminophen, albuterol, ondansetron (ZOFRAN) IV     Objective:   Vitals:   02/07/18 0805 02/07/18 0933 02/07/18 1132 02/07/18 1504  BP: (!) 101/58  (!) 111/51 (!) 116/46  Pulse: 79  82 88  Resp: 16  18 16   Temp: 98.3 F (36.8 C)  98.3 F (36.8 C)   TempSrc: Oral  Oral   SpO2: 95% 96% 95% 97%  Weight:      Height:        Intake/Output Summary (Last 24 hours) at 02/07/2018 1520 Last data filed at 02/07/2018 1304 Gross per 24 hour  Intake 720 ml  Output 300 ml  Net 420 ml   Filed Weights   02/05/18 1700 02/06/18 0521 02/07/18 0646  Weight: 76.6 kg 75.8 kg 77.1 kg     Examination:    General exam: Appears calm and comfortable  BP (!) 116/46   Pulse 88    Temp 98.3 F (36.8 C) (Oral)   Resp 16   Ht 5\' 6"  (1.676 m)   Wt 77.1 kg   SpO2 97%   BMI 27.44 kg/m    Physical Exam  Constitution:  Alert, cooperative, no distress,  Psychiatric: Normal and stable mood and affect, cognition intact,   HEENT: Normocephalic, PERRL, otherwise with in Normal limits  Chest:Chest symmetric Cardio vascular:  S1/S2, RRR, No murmure, No Rubs or Gallops  pulmonary: Clear to auscultation bilaterally, respirations unlabored, negative wheezes / crackles Abdomen: Soft, non-tender, non-distended, bowel sounds,no masses, no organomegaly Muscular skeletal: Limited exam - in bed, able to move all 4 extremities, Normal strength,  Neuro: CNII-XII intact. , normal motor and sensation, reflexes intact  Extremities: No pitting edema lower extremities, +2 pulses  Skin: Dry, warm to touch, negative for any Rashes, No open wounds Wounds: per nursing documentation       LABs:  CBC Latest Ref Rng & Units 02/06/2018 02/05/2018 09/29/2011  WBC 4.0 - 10.5 K/uL 5.9 6.6 6.2  Hemoglobin 12.0 - 15.0 g/dL 12.2 14.1 14.2  Hematocrit 36.0 - 46.0 % 39.9 45.1 42.5  Platelets 150 - 400 K/uL 129(L) 155 146.0(L)   CMP Latest Ref Rng & Units 02/06/2018 02/05/2018 09/29/2011  Glucose 70 - 99 mg/dL 105(H) 128(H) 82  BUN 8 - 23 mg/dL 12 10 11   Creatinine 0.44 - 1.00 mg/dL 1.07(H) 0.70 0.7  Sodium 135 - 145 mmol/L 143 140 138  Potassium 3.5 - 5.1 mmol/L 3.7 3.8 3.8  Chloride 98 - 111 mmol/L 104 102 103  CO2 22 - 32 mmol/L 31 29 27   Calcium 8.9 - 10.3 mg/dL 8.7(L) 9.2 9.3  Total Protein 6.0 - 8.3 g/dL - - 6.8  Total Bilirubin 0.3 - 1.2 mg/dL - - 0.5  Alkaline Phos 39 - 117 U/L - - 54  AST 0 - 37 U/L - - 17  ALT 0 - 35 U/L - - 12

## 2018-02-07 NOTE — Progress Notes (Signed)
Progress Note  Patient Name: Joann Ford Date of Encounter: 02/07/2018  Primary Cardiologist: Pixie Casino, MD   Subjective   Denies chest pain, palpitations, and shortness of breath.  Did have an episode of lightheadedness earlier.  Blood pressure is low normal.  Inpatient Medications    Scheduled Meds: . apixaban  5 mg Oral BID  . diltiazem  30 mg Oral Q6H  . escitalopram  20 mg Oral Daily  . gabapentin  300 mg Oral Daily  . mouth rinse  15 mL Mouth Rinse BID  . metoprolol tartrate  25 mg Oral BID  . morphine  15 mg Oral TID  . umeclidinium bromide  1 puff Inhalation Daily   Continuous Infusions:  PRN Meds: acetaminophen, albuterol, ondansetron (ZOFRAN) IV   Vital Signs    Vitals:   02/07/18 0015 02/07/18 0646 02/07/18 0805 02/07/18 0933  BP: (!) 104/56 106/83 (!) 101/58   Pulse: (!) 57 72 79   Resp: (!) 25 17 16    Temp: 98.2 F (36.8 C) 97.6 F (36.4 C) 98.3 F (36.8 C)   TempSrc: Oral Oral Oral   SpO2: 95% 95% 95% 96%  Weight:  77.1 kg    Height:        Intake/Output Summary (Last 24 hours) at 02/07/2018 0941 Last data filed at 02/07/2018 0700 Gross per 24 hour  Intake 800.25 ml  Output 300 ml  Net 500.25 ml   Filed Weights   02/05/18 1700 02/06/18 0521 02/07/18 0646  Weight: 76.6 kg 75.8 kg 77.1 kg    Telemetry    Atrial fibrillation, heart rate 70 bpm range- Personally Reviewed  ECG    No new tracings- Personally Reviewed  Physical Exam   GEN: No acute distress.   Neck: No JVD Cardiac:  Irregular rhythm, regular rate, no murmurs, rubs, or gallops.  Respiratory:  Prolonged expiratory phase with faint end expiratory wheezes bilaterally GI: Soft, nontender, non-distended  MS: No edema; No deformity. Neuro:  Nonfocal  Psych: Normal affect   Labs    Chemistry Recent Labs  Lab 02/05/18 1143 02/06/18 0411  NA 140 143  K 3.8 3.7  CL 102 104  CO2 29 31  GLUCOSE 128* 105*  BUN 10 12  CREATININE 0.70 1.07*  CALCIUM 9.2 8.7*    GFRNONAA >60 52*  GFRAA >60 >60  ANIONGAP 9 8     Hematology Recent Labs  Lab 02/05/18 1143 02/06/18 0411  WBC 6.6 5.9  RBC 4.70 4.14  HGB 14.1 12.2  HCT 45.1 39.9  MCV 96.0 96.4  MCH 30.0 29.5  MCHC 31.3 30.6  RDW 14.6 14.6  PLT 155 129*    Cardiac Enzymes Recent Labs  Lab 02/05/18 1143  TROPONINI 0.03*   No results for input(s): TROPIPOC in the last 168 hours.   BNP Recent Labs  Lab 02/05/18 1143  BNP 847.2*     DDimer No results for input(s): DDIMER in the last 168 hours.   Radiology    Dg Chest 2 View  Result Date: 02/05/2018 CLINICAL DATA:  Shortness of breath for 5 days. EXAM: CHEST - 2 VIEW COMPARISON:  07/10/2014 FINDINGS: Marked thoracolumbar scoliosis distorts cardio thoracic anatomy. Cardiopericardial silhouette is at upper limits of normal for size. Underlying diffuse chronic interstitial lung disease evident. Patchy opacity at the right base may be atelectasis or infiltrate. Lucency under the right hemidiaphragm is probably related to gaseous bowel distention. Bones are diffusely demineralized. Telemetry leads overlie the chest.The heart size  and mediastinal contours are within normal limits. Both lungs are clear. The visualized skeletal structures are unremarkable. IMPRESSION: 1. Underlying diffuse interstitial lung disease, likely chronic. Component of superimposed interstitial edema cannot be excluded. 2. Right base atelectasis or infiltrate. 3. Lucency under the right hemidiaphragm is probably related to gaseous distention of bowel. If there is clinical concern for hollow viscus perforation, decubitus films recommended to further evaluate. Electronically Signed   By: Misty Stanley M.D.   On: 02/05/2018 12:32    Cardiac Studies   Stress test 06/2017  The left ventricular ejection fraction is normal (55-65%).  Nuclear stress EF: 61%.  There was no ST segment deviation noted during stress.  Defect 1: There is a small defect of severe severity  present in the apical inferior, apical lateral and apex location.  Abnormal, low risk stress nuclear study with mild ischemia in the distal inferolateral wall/apex; EF 61 with normal wall motion.  Echo 06/2017 Study Conclusions  - Left ventricle: The cavity size was normal. Wall thickness was increased in a pattern of moderate LVH. Systolic function was normal. The estimated ejection fraction was in the range of 55% to 60%. Wall motion was normal; there were no regional wall motion abnormalities. Left ventricular diastolic function parameters were normal. - Aortic valve: There was mild stenosis. There was trivial regurgitation. - Left atrium: The atrium was mildly dilated. - Atrial septum: No defect or patent foramen ovale was identified.  Patient Profile     72 y.o. female with a hx of hyperlipidemia, COPD followed by Dr. Lamonte Sakai, Thoracics kyphosis, anxiety, arthritis, arteriosclerosis and ongoing tobacco smoking with 40-pack-year historywho is being seen today for the evaluation of atrial fibrillation with rapid ventricular rateat the request of Dr. Waldron Labs.  Assessment & Plan    1.New onset atrial fibrillation with rapid ventricular rate -She is currently asymptomatic and heart rate is controlled on Lopressor and diltiazem.   However, she had an episode of dizziness and systolic blood pressure is in the 90-100 range. I will switch short acting diltiazem to extended release 120 mg daily.    I will stop metoprolol. CHADSVASC score of 2 for age and sex.   Continue Eliquis 5mg  BID.If she remains asymptomatic and heart rate is controlled tomorrow, I think she could be discharged. Will not repeat echocardiogram or stress test.  2.Elevated BNP -Likely due to elevated rate. Lower extremity edema. Echocardiogram 06/2017 showed normal LV and diastolic function. Will follow clinically.   3.COPD -Sheis on Spiriva at home concern regarding compliance. Per  primary team.  4. Tobacco abuse - Advisedcessation  5. HLD - Continue statin  For questions or updates, please contact [place] Please consult [email or web address] for contact info under Cardiology/STEMI.      Signed, Kate Sable, MD  02/07/2018, 9:41 AM

## 2018-02-08 ENCOUNTER — Inpatient Hospital Stay (HOSPITAL_COMMUNITY): Payer: Medicare HMO

## 2018-02-08 ENCOUNTER — Observation Stay (HOSPITAL_COMMUNITY): Payer: Medicare HMO | Admitting: Certified Registered Nurse Anesthetist

## 2018-02-08 ENCOUNTER — Observation Stay (HOSPITAL_COMMUNITY): Payer: Medicare HMO

## 2018-02-08 ENCOUNTER — Encounter (HOSPITAL_COMMUNITY)
Admission: EM | Disposition: A | Discharge status: Home Health | Payer: Self-pay | Source: Home / Self Care | Attending: Internal Medicine

## 2018-02-08 ENCOUNTER — Encounter (HOSPITAL_COMMUNITY): Payer: Self-pay | Admitting: *Deleted

## 2018-02-08 DIAGNOSIS — R0609 Other forms of dyspnea: Secondary | ICD-10-CM | POA: Diagnosis not present

## 2018-02-08 DIAGNOSIS — E860 Dehydration: Secondary | ICD-10-CM | POA: Diagnosis present

## 2018-02-08 DIAGNOSIS — I7090 Unspecified atherosclerosis: Secondary | ICD-10-CM | POA: Diagnosis present

## 2018-02-08 DIAGNOSIS — I340 Nonrheumatic mitral (valve) insufficiency: Secondary | ICD-10-CM | POA: Diagnosis not present

## 2018-02-08 DIAGNOSIS — I5041 Acute combined systolic (congestive) and diastolic (congestive) heart failure: Secondary | ICD-10-CM | POA: Diagnosis present

## 2018-02-08 DIAGNOSIS — Z79899 Other long term (current) drug therapy: Secondary | ICD-10-CM | POA: Diagnosis not present

## 2018-02-08 DIAGNOSIS — I4819 Other persistent atrial fibrillation: Principal | ICD-10-CM

## 2018-02-08 DIAGNOSIS — Z888 Allergy status to other drugs, medicaments and biological substances status: Secondary | ICD-10-CM | POA: Diagnosis not present

## 2018-02-08 DIAGNOSIS — J449 Chronic obstructive pulmonary disease, unspecified: Secondary | ICD-10-CM | POA: Diagnosis present

## 2018-02-08 DIAGNOSIS — G894 Chronic pain syndrome: Secondary | ICD-10-CM | POA: Diagnosis present

## 2018-02-08 DIAGNOSIS — M40294 Other kyphosis, thoracic region: Secondary | ICD-10-CM | POA: Diagnosis present

## 2018-02-08 DIAGNOSIS — I350 Nonrheumatic aortic (valve) stenosis: Secondary | ICD-10-CM | POA: Diagnosis present

## 2018-02-08 DIAGNOSIS — E785 Hyperlipidemia, unspecified: Secondary | ICD-10-CM | POA: Diagnosis present

## 2018-02-08 DIAGNOSIS — Z881 Allergy status to other antibiotic agents status: Secondary | ICD-10-CM | POA: Diagnosis not present

## 2018-02-08 DIAGNOSIS — I4891 Unspecified atrial fibrillation: Secondary | ICD-10-CM | POA: Diagnosis not present

## 2018-02-08 DIAGNOSIS — R0902 Hypoxemia: Secondary | ICD-10-CM | POA: Diagnosis present

## 2018-02-08 DIAGNOSIS — M1990 Unspecified osteoarthritis, unspecified site: Secondary | ICD-10-CM | POA: Diagnosis present

## 2018-02-08 DIAGNOSIS — F17210 Nicotine dependence, cigarettes, uncomplicated: Secondary | ICD-10-CM | POA: Diagnosis present

## 2018-02-08 DIAGNOSIS — Z880 Allergy status to penicillin: Secondary | ICD-10-CM | POA: Diagnosis not present

## 2018-02-08 DIAGNOSIS — J849 Interstitial pulmonary disease, unspecified: Secondary | ICD-10-CM | POA: Diagnosis present

## 2018-02-08 DIAGNOSIS — R011 Cardiac murmur, unspecified: Secondary | ICD-10-CM | POA: Diagnosis present

## 2018-02-08 DIAGNOSIS — M419 Scoliosis, unspecified: Secondary | ICD-10-CM | POA: Diagnosis present

## 2018-02-08 DIAGNOSIS — M810 Age-related osteoporosis without current pathological fracture: Secondary | ICD-10-CM | POA: Diagnosis present

## 2018-02-08 DIAGNOSIS — F419 Anxiety disorder, unspecified: Secondary | ICD-10-CM | POA: Diagnosis present

## 2018-02-08 HISTORY — PX: CARDIOVERSION: SHX1299

## 2018-02-08 HISTORY — PX: TEE WITHOUT CARDIOVERSION: SHX5443

## 2018-02-08 SURGERY — ECHOCARDIOGRAM, TRANSESOPHAGEAL
Anesthesia: Monitor Anesthesia Care

## 2018-02-08 MED ORDER — FUROSEMIDE 10 MG/ML IJ SOLN
40.0000 mg | Freq: Every day | INTRAMUSCULAR | Status: DC
  Administered 2018-02-08: 40 mg via INTRAVENOUS
  Filled 2018-02-08: qty 4

## 2018-02-08 MED ORDER — SODIUM CHLORIDE 0.9 % IV SOLN: INTRAVENOUS | Status: DC

## 2018-02-08 MED ORDER — PROPOFOL 500 MG/50ML IV EMUL
INTRAVENOUS | Status: DC | PRN
  Administered 2018-02-08: 100 ug/kg/min via INTRAVENOUS

## 2018-02-08 MED ORDER — SODIUM CHLORIDE 0.9% FLUSH: 3.0000 mL | INTRAVENOUS | Status: DC | PRN

## 2018-02-08 MED ORDER — SODIUM CHLORIDE 0.9 % IV SOLN
250.0000 mL | INTRAVENOUS | Status: DC
  Administered 2018-02-08: 09:00:00 via INTRAVENOUS

## 2018-02-08 MED ORDER — METHYLPREDNISOLONE SODIUM SUCC 125 MG IJ SOLR
80.0000 mg | Freq: Once | INTRAMUSCULAR | Status: AC
  Administered 2018-02-08: 80 mg via INTRAVENOUS
  Filled 2018-02-08: qty 2

## 2018-02-08 MED ORDER — SODIUM CHLORIDE 0.9% FLUSH
3.0000 mL | Freq: Two times a day (BID) | INTRAVENOUS | Status: DC
  Administered 2018-02-08 – 2018-02-09 (×4): 3 mL via INTRAVENOUS

## 2018-02-08 MED ORDER — ROSUVASTATIN CALCIUM 20 MG PO TABS
20.0000 mg | ORAL_TABLET | Freq: Every day | ORAL | Status: DC
  Administered 2018-02-08 – 2018-02-09 (×2): 20 mg via ORAL
  Filled 2018-02-08 (×2): qty 1

## 2018-02-08 MED ORDER — LIDOCAINE HCL (CARDIAC) PF 100 MG/5ML IV SOSY
PREFILLED_SYRINGE | INTRAVENOUS | Status: DC | PRN
  Administered 2018-02-08: 60 mg via INTRAVENOUS

## 2018-02-08 MED ORDER — IPRATROPIUM-ALBUTEROL 0.5-2.5 (3) MG/3ML IN SOLN: 3.0000 mL | Freq: Once | RESPIRATORY_TRACT | Status: DC

## 2018-02-08 NOTE — Anesthesia Preprocedure Evaluation (Addendum)
Anesthesia Evaluation  Patient identified by MRN, date of birth, ID band Patient awake    Reviewed: Allergy & Precautions, NPO status , Patient's Chart, lab work & pertinent test results  Airway Mallampati: II  TM Distance: >3 FB Neck ROM: Full    Dental no notable dental hx. (+) Dental Advisory Given   Pulmonary COPD, Current Smoker,    Pulmonary exam normal        Cardiovascular hypertension, + dysrhythmias Atrial Fibrillation  Rhythm:Irregular Rate:Normal  The left ventricular ejection fraction is normal (55-65%).  Nuclear stress EF: 61%. Show more   There was no ST segment deviation noted during stress.  Defect 1: There is a small defect of severe severity present in the  apical inferior, apical lateral and apex location.  Abnormal, low risk stress nuclear study with mild ischemia in the distal  inferolateral wall/apex; EF 61 with normal wall motion.      Neuro/Psych PSYCHIATRIC DISORDERS Anxiety negative neurological ROS     GI/Hepatic negative GI ROS, Neg liver ROS,   Endo/Other  negative endocrine ROS  Renal/GU negative Renal ROS  negative genitourinary   Musculoskeletal  (+) Arthritis ,   Abdominal   Peds negative pediatric ROS (+)  Hematology negative hematology ROS (+)   Anesthesia Other Findings   Reproductive/Obstetrics negative OB ROS                            Anesthesia Physical Anesthesia Plan  ASA: II  Anesthesia Plan: MAC   Post-op Pain Management:    Induction: Intravenous  PONV Risk Score and Plan:   Airway Management Planned: Mask  Additional Equipment:   Intra-op Plan:   Post-operative Plan:   Informed Consent: I have reviewed the patients History and Physical, chart, labs and discussed the procedure including the risks, benefits and alternatives for the proposed anesthesia with the patient or authorized representative who has indicated  his/her understanding and acceptance.   Dental advisory given  Plan Discussed with: CRNA and Anesthesiologist  Anesthesia Plan Comments:         Anesthesia Quick Evaluation

## 2018-02-08 NOTE — CV Procedure (Addendum)
TEE/CARDIOVERSION NOTE  TRANSESOPHAGEAL ECHOCARDIOGRAM (TEE):  Indictation: Atrial Fibrillation  Consent:   Informed consent was obtained prior to the procedure. The risks, benefits and alternatives for the procedure were discussed and the patient comprehended these risks.  Risks include, but are not limited to, cough, sore throat, vomiting, nausea, somnolence, esophageal and stomach trauma or perforation, bleeding, low blood pressure, aspiration, pneumonia, infection, trauma to the teeth and death.    Time Out: Verified patient identification, verified procedure, site/side was marked, verified correct patient position, special equipment/implants available, medications/allergies/relevent history reviewed, required imaging and test results available. Performed  Procedure:  After a procedural time-out, the patient was given propofol per anesthesia for sedation. The patient's heart rate, blood pressure, and oxygen saturation are monitored continuously during the procedure. The oropharynx was anesthetized 10 cc of topical 1% viscous lidocaine.  The transesophageal probe was inserted in the esophagus and stomach without difficulty and multiple views were obtained. Agitated microbubble saline contrast was not administered.  Complications:    Complications: None Patient did tolerate procedure well.  Findings:  1. LEFT VENTRICLE: The left ventricular wall thickness is moderately increased.  The left ventricular cavity is normal in size. Wall motion is mildly hypokinetic.  LVEF is 40-45%.  2. RIGHT VENTRICLE:  The right ventricle is normal in structure and function without any thrombus or masses.    3. LEFT ATRIUM:  The left atrium is mildly dilated in size without any thrombus or masses.  There is not spontaneous echo contrast ("smoke") in the left atrium consistent with a low flow state.  4. LEFT ATRIAL APPENDAGE:  The left atrial appendage is free of any thrombus or masses. The appendage  has single lobes. Pulse doppler indicates moderate flow in the appendage.  5. ATRIAL SEPTUM:  The atrial septum appears intact and is free of thrombus and/or masses.  There is no evidence for interatrial shunting by color doppler and saline microbubble.  6. RIGHT ATRIUM:  The right atrium is normal in size and function without any thrombus or masses.  7. MITRAL VALVE:  The mitral valve is normal in structure and function with trace to mild regurgitation.  There were no vegetations or stenosis.  8. AORTIC VALVE:  The aortic valve is trileaflet and mildly sclerotic with Mild regurgitation.  There is mild stenosis.  9. TRICUSPID VALVE:  The tricuspid valve is normal in structure and function with Mild regurgitation.  There were no vegetations or stenosis  10.  PULMONIC VALVE:  The pulmonic valve is normal in structure and function with trivial regurgitation.  There were no vegetations or stenosis.   11. AORTIC ARCH, ASCENDING AND DESCENDING AORTA:  There was grade 1 Ron Parker et. Al, 1992) atherosclerosis of the ascending aorta, aortic arch, or proximal descending aorta.  12. PULMONARY VEINS: Anomalous pulmonary venous return was not noted.  13. PERICARDIUM: The pericardium appeared normal and non-thickened.  There is no pericardial effusion.  CARDIOVERSION:     Second Time Out: Verified patient identification, verified procedure, site/side was marked, verified correct patient position, special equipment/implants available, medications/allergies/relevent history reviewed, required imaging and test results available.  Performed  Procedure:  1. Patient placed on cardiac monitor, pulse oximetry, supplemental oxygen as necessary.  2. Sedation administered per anesthesia 3. Pacer pads placed anterior and posterior chest. 4. Cardioverted 3 time(s).  5. Cardioverted at 150J and 200J x 2 biphasic - unsuccessful without any sinus beats  Complications:  Complications: None Patient did tolerate  procedure well.  Impression:  1. Unsuccessful DCCV despite 3 stacked shocks - no evidence of sinus rhythm after cardioversion. 2. Mild aortic stenosis with mild regurgitation 3. Mild LAE 4. LVEF 40-45% with global hypokinesis   Recommendations:  1. Consider AAD therapy and repeat cardioversion in 1 month 2. Not able to lie flat without dyspnea - may need additional diuresis  Time Spent Directly with the Patient:  45 minutes   Pixie Casino, MD, [facility], [place] Director of the [facility]  [facility] Diplomate of the American Board of Clinical Lipidology Attending Cardiologist  Direct Dial: [phone]  Fax: [phone]  Website:  [email or web address] 02/08/2018, 9:25 AM

## 2018-02-08 NOTE — Progress Notes (Signed)
  Echocardiogram Echocardiogram Transesophageal has been performed.  Joann Ford 02/08/2018, 9:32 AM

## 2018-02-08 NOTE — H&P (Signed)
   INTERVAL PROCEDURE H&P  History and Physical Interval Note:  02/08/2018 8:27 AM  Levell July has presented today for their planned procedure. The various methods of treatment have been discussed with the patient and family. After consideration of risks, benefits and other options for treatment, the patient has consented to the procedure.  The patients' outpatient history has been reviewed, patient examined, and no change in status from most recent office note within the past 30 days. I have reviewed the patients' chart and labs and will proceed as planned. Questions were answered to the patient's satisfaction.   Pixie Casino, MD, [facility], [place] Director of the [facility]  [facility] Diplomate of the American Board of Clinical Lipidology Attending Cardiologist  Direct Dial: [phone]  Fax: [phone]  Website:  [email or web address] 02/08/2018, 8:27 AM

## 2018-02-08 NOTE — Transfer of Care (Signed)
Immediate Anesthesia Transfer of Care Note  Patient: Joann Ford  Procedure(s) Performed: TRANSESOPHAGEAL ECHOCARDIOGRAM (TEE) (N/A ) CARDIOVERSION (N/A )  Patient Location: Endoscopy Unit  Anesthesia Type:General  Level of Consciousness: awake, patient cooperative and responds to stimulation  Airway & Oxygen Therapy: Patient Spontanous Breathing and Patient connected to nasal cannula oxygen  Post-op Assessment: Report given to RN and Post -op Vital signs reviewed and stable  Post vital signs: Reviewed and stable  Last Vitals:  Vitals Value Taken Time  BP 126/59 02/08/2018  9:30 AM  Temp 36.7 C 02/08/2018  9:30 AM  Pulse    Resp 15 02/08/2018  9:30 AM  SpO2 95 % 02/08/2018  9:30 AM    Last Pain:  Vitals:   02/08/18 0930  TempSrc: Oral  PainSc: 0-No pain      Patients Stated Pain Goal: 5 (40/98/11 9147)  Complications: No apparent anesthesia complications

## 2018-02-08 NOTE — Anesthesia Postprocedure Evaluation (Signed)
Anesthesia Post Note  Patient: Joann Ford  Procedure(s) Performed: TRANSESOPHAGEAL ECHOCARDIOGRAM (TEE) (N/A ) CARDIOVERSION (N/A )     Anesthesia Type: General and MAC    Last Vitals:  Vitals:   02/08/18 0930 02/08/18 0942  BP: (!) 126/59 102/71  Pulse:    Resp: 15 16  Temp: 36.7 C   SpO2: 95% 95%    Last Pain:  Vitals:   02/08/18 0942  TempSrc:   PainSc: 0-No pain                 [name] DANIEL

## 2018-02-08 NOTE — Progress Notes (Addendum)
Progress Note  Patient Name: Joann Ford Date of Encounter: 02/08/2018  Primary Cardiologist: Pixie Casino, MD   Subjective   Complains of dyspnea; no chest pain  Inpatient Medications    Scheduled Meds: . apixaban  5 mg Oral BID  . diltiazem  120 mg Oral Daily  . escitalopram  20 mg Oral Daily  . gabapentin  300 mg Oral Daily  . mouth rinse  15 mL Mouth Rinse BID  . morphine  15 mg Oral TID  . umeclidinium bromide  1 puff Inhalation Daily   Continuous Infusions:  PRN Meds: acetaminophen, albuterol, ondansetron (ZOFRAN) IV   Vital Signs    Vitals:   02/07/18 2338 02/07/18 2343 02/08/18 0015 02/08/18 0408  BP:   (!) 91/57   Pulse: 73 76 84 87  Resp: (!) 21 (!) 27    Temp:   97.9 F (36.6 C) 97.9 F (36.6 C)  TempSrc:   Oral Oral  SpO2: 97% 93% 90% 92%  Weight:    78 kg  Height:        Intake/Output Summary (Last 24 hours) at 02/08/2018 0718 Last data filed at 02/07/2018 2016 Gross per 24 hour  Intake 702 ml  Output 100 ml  Net 602 ml   Filed Weights   02/06/18 0521 02/07/18 0646 02/08/18 0408  Weight: 75.8 kg 77.1 kg 78 kg    Telemetry    Atrial fibrillation, rate controlled - Personally Reviewed   Physical Exam   GEN: WD, mildly dyspneic Neck: No JVD Cardiac: irregular Respiratory: Mild diffuse exp wheeze GI: Soft, nontender, non-distended  MS: trace edema; chronic skin changes Neuro:  Nonfocal   Labs    Chemistry Recent Labs  Lab 02/05/18 1143 02/06/18 0411  NA 140 143  K 3.8 3.7  CL 102 104  CO2 29 31  GLUCOSE 128* 105*  BUN 10 12  CREATININE 0.70 1.07*  CALCIUM 9.2 8.7*  GFRNONAA >60 52*  GFRAA >60 >60  ANIONGAP 9 8     Hematology Recent Labs  Lab 02/05/18 1143 02/06/18 0411  WBC 6.6 5.9  RBC 4.70 4.14  HGB 14.1 12.2  HCT 45.1 39.9  MCV 96.0 96.4  MCH 30.0 29.5  MCHC 31.3 30.6  RDW 14.6 14.6  PLT 155 129*    Cardiac Enzymes Recent Labs  Lab 02/05/18 1143  TROPONINI 0.03*    BNP Recent Labs    Lab 02/05/18 1143  BNP 847.2*      Cardiac Studies   Echocardiogram April 2019 showed normal LV function, mild aortic stenosis with mean gradient 15 mmHg, trace aortic insufficiency and mild left atrial enlargement.  Nuclear study April 2019 showed ejection fraction 61% with mild ischemia in the distal inferior lateral wall/apex.  Treated medically.  Patient Profile     72 y.o. female with a hx of hyperlipidemia, COPD followed by Dr. Lamonte Sakai, Thoracics kyphosis, anxiety, arthritis, arteriosclerosis and ongoing tobacco smoking with 40-pack-year historywho is being seen today for the evaluation of atrial fibrillation with rapid ventricular rate.  Assessment & Plan    1 persistent atrial fibrillation-patient has been diagnosed with new onset atrial fibrillation.  TSH normal.  Previous echocardiogram showed normal LV function.  Plan to continue apixaban at present dose.  Continue Cardizem.  She remains dyspneic this morning compared to baseline.  Atrial fibrillation could certainly be contributing.  We will arrange TEE guided cardioversion to see if sinus will improve her symptoms.  2 COPD-continue bronchodilators.  Metoprolol was  discontinued previously due to potential exacerbation of pulmonary status.  3 tobacco abuse-cessation advised previously.  4 history of mild aortic stenosis-we will need follow-up echoes in the future.  For questions or updates, please contact [place] Please consult [email or web address] for contact info under        Signed, Kirk Ruths, MD  02/08/2018, 7:18 AM

## 2018-02-08 NOTE — Progress Notes (Signed)
PROGRESS NOTE    Patient: Joann Ford                            PCP: Jaquita Rector, PA-C                    DOB: [birth year]            DOA: 02/05/2018 CWU:[id number]             DOS: 02/08/2018, 4:04 PM   LOS: 1 day     Brief Narrative:    [name]  is a 72 y.o. female, with past medical history of aortic valve stenosis, hyperlipidemia, tobacco abuse, COPD, scoliosis, was seen recently by cardiology as an outpatient for preop evaluation for scoliosis surgery, low risk stress test, EF 55 to 60%, with very mild stenosis of the aortic valve, presents with shortness of breath, her work-up was significant for A. fib with RVR, heart rate in the 140s to 150s, and by cardiology, noted on anticoagulation, Cardizem with good heart rate control, went for TEE with cardioversion 02/08/2018, unsuccessful despite 3 attempts.  Subjective:   Denies any chest pain, reports dyspnea, unable to lay flat   Assessment & Plan:    Active Problems:   Hyperlipemia   DOE (dyspnea on exertion)   Tobacco abuse   Chronic obstructive pulmonary disease (HCC)   Aortic valve stenosis   Atrial fibrillation with RVR (HCC)   Persistent A. fib with RVR -New onset A. fib with RVR, TSH within normal limits, . -Is on anticoagulation with Eliquis  -Requiring [place]isan drip initially for heart rate controlled, transition to oral Cardizem with good control . -When she is dyspneic, she went for TEE with cardioversion on 02/08/2018, TEE with no atrial thrombus, but unfortunately despite DCCV x3, no sinus rhythm was achieved .  Acute systolic CHF -2D echo was significant for EF 40 to 45%, he is dyspneic today, with increased work of breathing, will obtain DG chest portable, will start on diuresis 40 mg IV Lasix daily. -Further recommendation per cardiology  COPD -No active wheezing, continue with home meds.  Aortic valve stenosis -Cardiology on board  Hyperlipidemia -Continue with home dose  statin  Tobacco abuse -Reports she quit yesterday, will start on nicotine patch  Chronic pain syndrome -Continue with home meds   DVT prophylaxis: Patient on apixaban  Code Status:   Code Status: Full Code  Family Communication: Husband at bedside  Disposition Plan: Home when appropriately diuresed, and heart rate is controlled  Procedures: TEE with DCCV x3 Consultants: Cardiology  Procedures:   No admission procedures for hospital encounter.     Antimicrobials:  Anti-infectives (From admission, onward)   None       Medication:  . apixaban  5 mg Oral BID  . diltiazem  120 mg Oral Daily  . escitalopram  20 mg Oral Daily  . furosemide  40 mg Intravenous Daily  . gabapentin  300 mg Oral Daily  . mouth rinse  15 mL Mouth Rinse BID  . morphine  15 mg Oral TID  . rosuvastatin  20 mg Oral q1800  . sodium chloride flush  3 mL Intravenous Q12H  . umeclidinium bromide  1 puff Inhalation Daily    acetaminophen, albuterol, ondansetron (ZOFRAN) IV, sodium chloride flush     Objective:   Vitals:   02/08/18 0942 02/08/18 1022 02/08/18 1149 02/08/18 1235  BP: 102/71 125/79  127/90  Pulse:    (!) 105  Resp: 16   (!) 28  Temp:    98 F (36.7 C)  TempSrc:    Oral  SpO2: 95%  94% 91%  Weight:      Height:        Intake/Output Summary (Last 24 hours) at 02/08/2018 1604 Last data filed at 02/08/2018 1500 Gross per 24 hour  Intake 780 ml  Output 375 ml  Net 405 ml   Filed Weights   02/06/18 0521 02/07/18 0646 02/08/18 0408  Weight: 75.8 kg 77.1 kg 78 kg     Examination:    General exam: Appears calm and comfortable  BP 127/90 (BP Location: Left Arm)   Pulse (!) 105   Temp 98 F (36.7 C) (Oral)   Resp (!) 28   Ht 5\' 6"  (1.676 m)   Wt 78 kg   SpO2 91%   BMI 27.76 kg/m    Physical Exam    Awake Alert, Oriented X 3, No new F.N deficits, Normal affect, appears to be mildly uncomfortable secondary to dyspnea Difficult scoliosis Symmetrical Chest  wall movement, Good air movement bilaterally, mild wheezing RRR,No Gallops,Rubs or new Murmurs, No Parasternal Heave +ve B.Sounds, Abd Soft, No tenderness, No rebound - guarding or rigidity. No Cyanosis, Clubbing or edema, chronic lower extremity skin changes       LABs:  CBC Latest Ref Rng & Units 02/06/2018 02/05/2018 09/29/2011  WBC 4.0 - 10.5 K/uL 5.9 6.6 6.2  Hemoglobin 12.0 - 15.0 g/dL 12.2 14.1 14.2  Hematocrit 36.0 - 46.0 % 39.9 45.1 42.5  Platelets 150 - 400 K/uL 129(L) 155 146.0(L)   CMP Latest Ref Rng & Units 02/06/2018 02/05/2018 09/29/2011  Glucose 70 - 99 mg/dL 105(H) 128(H) 82  BUN 8 - 23 mg/dL 12 10 11   Creatinine 0.44 - 1.00 mg/dL 1.07(H) 0.70 0.7  Sodium 135 - 145 mmol/L 143 140 138  Potassium 3.5 - 5.1 mmol/L 3.7 3.8 3.8  Chloride 98 - 111 mmol/L 104 102 103  CO2 22 - 32 mmol/L 31 29 27   Calcium 8.9 - 10.3 mg/dL 8.7(L) 9.2 9.3  Total Protein 6.0 - 8.3 g/dL - - 6.8  Total Bilirubin 0.3 - 1.2 mg/dL - - 0.5  Alkaline Phos 39 - 117 U/L - - 54  AST 0 - 37 U/L - - 17  ALT 0 - 35 U/L - - 12   Kenai Fluegel MD [facility]

## 2018-02-09 LAB — CBC
HCT: 37.2 % (ref 36.0–46.0)
Hemoglobin: 13.7 g/dL (ref 12.0–15.0)
MCH: 33.3 pg (ref 26.0–34.0)
MCHC: 36.8 g/dL — ABNORMAL HIGH (ref 30.0–36.0)
MCV: 90.5 fL (ref 80.0–100.0)
NRBC: 0 % (ref 0.0–0.2)
PLATELETS: 285 10*3/uL (ref 150–400)
RBC: 4.11 MIL/uL (ref 3.87–5.11)
RDW: 14.4 % (ref 11.5–15.5)
WBC: 7.1 10*3/uL (ref 4.0–10.5)

## 2018-02-09 LAB — BASIC METABOLIC PANEL
Anion gap: 12 (ref 5–15)
BUN: 17 mg/dL (ref 8–23)
CO2: 30 mmol/L (ref 22–32)
Calcium: 9.3 mg/dL (ref 8.9–10.3)
Chloride: 94 mmol/L — ABNORMAL LOW (ref 98–111)
Creatinine, Ser: 0.74 mg/dL (ref 0.44–1.00)
GFR calc Af Amer: >60 mL/min (ref >60)
GLUCOSE: 186 mg/dL — AB (ref 70–99)
Potassium: 3.8 mmol/L (ref 3.5–5.1)
Sodium: 136 mmol/L (ref 135–145)

## 2018-02-09 MED ORDER — DILTIAZEM HCL ER COATED BEADS 240 MG PO CP24
240.0000 mg | ORAL_CAPSULE | Freq: Every day | ORAL | Status: DC
  Administered 2018-02-09 – 2018-02-10 (×2): 240 mg via ORAL
  Filled 2018-02-09 (×2): qty 1

## 2018-02-09 MED ORDER — FUROSEMIDE 10 MG/ML IJ SOLN
40.0000 mg | Freq: Two times a day (BID) | INTRAMUSCULAR | Status: DC
  Administered 2018-02-09 (×2): 40 mg via INTRAVENOUS
  Filled 2018-02-09 (×2): qty 4

## 2018-02-09 NOTE — Care Management (Signed)
#    6.  S/W  GRIFFIN @ [facility] RX # [phone]  1. TIKOSYN   500 MCG BID  250 MCG  BID COVER- NONE FORMULARY PRIOR APPROVAL- YES # [phone]   2. DOFETILIDE  500 MCG  BID COVER- YES CO-PAY- $ 86.09 TIER- 4 DRUG PRIOR APPROVAL- NO  3. DOFETILIDE  250 MCG  BID COVER- YES CO-PAY- $ 86.09 TIER- 4 DRUG PRIOR APPROVAL- NO   3. ELIQUIS  5 MG BID COVER- YES CO-PAY- $ 45.00 TIER- 3 DRUG PRIOR APPROVAL-NO  PREFERRED PHARMACY :  YES  - [facility]

## 2018-02-09 NOTE — Progress Notes (Addendum)
Progress Note  Patient Name: Joann Ford Date of Encounter: 02/09/2018  Primary Cardiologist: Pixie Casino, MD   Subjective   Denies CP or dyspnea  Inpatient Medications    Scheduled Meds: . apixaban  5 mg Oral BID  . diltiazem  120 mg Oral Daily  . escitalopram  20 mg Oral Daily  . furosemide  40 mg Intravenous BID  . gabapentin  300 mg Oral Daily  . ipratropium-albuterol  3 mL Nebulization Once  . mouth rinse  15 mL Mouth Rinse BID  . morphine  15 mg Oral TID  . rosuvastatin  20 mg Oral q1800  . sodium chloride flush  3 mL Intravenous Q12H  . umeclidinium bromide  1 puff Inhalation Daily   Continuous Infusions: . sodium chloride Stopped (02/08/18 0932)   PRN Meds: acetaminophen, albuterol, ondansetron (ZOFRAN) IV, sodium chloride flush   Vital Signs    Vitals:   02/08/18 1931 02/08/18 1931 02/08/18 2358 02/09/18 0411  BP: 108/81 108/81 136/90 126/83  Pulse: (!) 120 98 (!) 112 (!) 116  Resp:      Temp: 97.8 F (36.6 C) 97.8 F (36.6 C) 98.6 F (37 C) 97.9 F (36.6 C)  TempSrc: Oral Oral Oral Oral  SpO2: 90% 90% 92% 94%  Weight:    76.2 kg  Height:        Intake/Output Summary (Last 24 hours) at 02/09/2018 0754 Last data filed at 02/09/2018 0300 Gross per 24 hour  Intake 1660 ml  Output 1375 ml  Net 285 ml   Filed Weights   02/07/18 0646 02/08/18 0408 02/09/18 0411  Weight: 77.1 kg 78 kg 76.2 kg    Telemetry    Atrial fibrillation, rate elevated - Personally Reviewed   Physical Exam   GEN: WD, NAD Neck: No JVD, supple Cardiac: irregular and tachycardic Respiratory: Diminished BS throughout GI: Soft, NT/ND MS: trace to 1+ edema Neuro:  grossly intact  Labs    Chemistry Recent Labs  Lab 02/05/18 1143 02/06/18 0411 02/09/18 0407  NA 140 143 136  K 3.8 3.7 3.8  CL 102 104 94*  CO2 29 31 30   GLUCOSE 128* 105* 186*  BUN 10 12 17   CREATININE 0.70 1.07* 0.74  CALCIUM 9.2 8.7* 9.3  GFRNONAA >60 52* >60  GFRAA >60 >60 >60    ANIONGAP 9 8 12      Hematology Recent Labs  Lab 02/05/18 1143 02/06/18 0411 02/09/18 0407  WBC 6.6 5.9 7.1  RBC 4.70 4.14 4.11  HGB 14.1 12.2 13.7  HCT 45.1 39.9 37.2  MCV 96.0 96.4 90.5  MCH 30.0 29.5 33.3  MCHC 31.3 30.6 36.8*  RDW 14.6 14.6 14.4  PLT 155 129* 285    Cardiac Enzymes Recent Labs  Lab 02/05/18 1143  TROPONINI 0.03*    BNP Recent Labs  Lab 02/05/18 1143  BNP 847.2*      Cardiac Studies   Echocardiogram April 2019 showed normal LV function, mild aortic stenosis with mean gradient 15 mmHg, trace aortic insufficiency and mild left atrial enlargement.  Nuclear study April 2019 showed ejection fraction 61% with mild ischemia in the distal inferior lateral wall/apex.  Treated medically.  Patient Profile     72 y.o. female with a hx of hyperlipidemia, COPD followed by Dr. Lamonte Sakai, Thoracics kyphosis, anxiety, arthritis, arteriosclerosis and ongoing tobacco smoking with 40-pack-year historywho is being seen today for the evaluation of atrial fibrillation with rapid ventricular rate.  Assessment & Plan    1  persistent atrial fibrillation-TEE without LAA thrombus yesterday but DCCV unsuccessful. I will plan rate control and anticoagulation for now. If she has worsening symptoms despite adequate rate control, will likely need tikosyn followed by repeat attempt at DCCV. HR remains elevated; change cardizem to 240 mg daily and follow; continue apixaban.   2 COPD-continue bronchodilators.  Per IM. Beta blocker DCed previously.  3 tobacco abuse-cessation advised previously.  4 history of mild aortic stenosis-we will need follow-up echoes in the future.  5 acute diastolic CHF-mildly volume overloaded; continue lasix 40 mg BID and follow renal function.  6 mild to moderate LV dysfunction-likely related to atrial fibrillation with RVR; will not add beta blocker given COPD; repeat echo in 3 months once HR conrolled.  For questions or updates, please contact [place] Please consult [email or web address] for contact info under        Signed, Kirk Ruths, MD  02/09/2018, 7:54 AM

## 2018-02-09 NOTE — Progress Notes (Signed)
PROGRESS NOTE    Patient: Joann Ford                            PCP: Jaquita Rector, PA-C                    DOB: Sep 26, 1945            DOA: 02/05/2018 [id number]             DOS: 02/09/2018, 2:21 PM   LOS: 2 days     Brief Narrative:    Joann Ford  is a 72 y.o. female, with past medical history of aortic valve stenosis, hyperlipidemia, tobacco abuse, COPD, scoliosis, was seen recently by cardiology as an outpatient for preop evaluation for scoliosis surgery, low risk stress test, EF 55 to 60%, with very mild stenosis of the aortic valve, presents with shortness of breath, her work-up was significant for A. fib with RVR, heart rate in the 140s to 150s, and by cardiology, noted on anticoagulation, Cardizem with good heart rate control, went for TEE with cardioversion 02/08/2018, unsuccessful despite 3 attempts.  Subjective:   Denies any chest pain, reports dyspnea has improved  Assessment & Plan:    Active Problems:   Hyperlipemia   DOE (dyspnea on exertion)   Tobacco abuse   Chronic obstructive pulmonary disease (HCC)   Aortic valve stenosis   Atrial fibrillation with RVR (HCC)   Persistent A. fib with RVR -New onset A. fib with RVR, TSH within normal limits, . -Is on anticoagulation with Eliquis  -Requiring Cardizem drip initially for heart rate controlled, transition to oral Cardizem with good control . -When she is dyspneic, she went for TEE with cardioversion on 02/08/2018, TEE with no atrial thrombus, but unfortunately despite DCCV x3, no sinus rhythm was achieved . -Management per cardiology her Cardizem was increased to 240 mg oral daily, cardiology will likely need Tikosyn  Acute combined diastolic/systolic CHF -2D echo was significant for EF 40 to 45%, -No beta-blockers given severe COPD -Portable chest x-ray obtained yesterday significant for volume overload, as well has volume overload on physical exam, Lasix increased today to 40 mg IV twice  daily, monitor renal function closely.  COPD -No active wheezing, continue with home meds.  Aortic valve stenosis -Cardiology on board  Hyperlipidemia -Continue with home dose statin  Tobacco abuse - on nicotine patch  Chronic pain syndrome -Continue with home meds   DVT prophylaxis: Patient on apixaban  Code Status:   Code Status: Full Code  Family Communication: Husband at bedside, discussed with daughter via phone yesterday  Disposition Plan: Home when appropriately diuresed, and heart rate is controlled, will consult PT  Procedures: TEE with DCCV x3 02/08/2018  Consultants: Cardiology  Procedures:   No admission procedures for hospital encounter.     Antimicrobials:  Anti-infectives (From admission, onward)   None       Medication:  . apixaban  5 mg Oral BID  . diltiazem  240 mg Oral Daily  . escitalopram  20 mg Oral Daily  . furosemide  40 mg Intravenous BID  . gabapentin  300 mg Oral Daily  . ipratropium-albuterol  3 mL Nebulization Once  . mouth rinse  15 mL Mouth Rinse BID  . morphine  15 mg Oral TID  . rosuvastatin  20 mg Oral q1800  . sodium chloride flush  3 mL Intravenous Q12H  . umeclidinium bromide  1 puff  Inhalation Daily    acetaminophen, albuterol, ondansetron (ZOFRAN) IV, sodium chloride flush     Objective:   Vitals:   02/09/18 0819 02/09/18 0831 02/09/18 1042 02/09/18 1124  BP: 130/90  136/88   Pulse: (!) 136   (!) 53  Resp:      Temp: 98.3 F (36.8 C)   98.4 F (36.9 C)  TempSrc: Oral   Oral  SpO2: 93% 93%  94%  Weight:      Height:        Intake/Output Summary (Last 24 hours) at 02/09/2018 1421 Last data filed at 02/09/2018 1100 Gross per 24 hour  Intake 1100 ml  Output 1025 ml  Net 75 ml   Filed Weights   02/07/18 0646 02/08/18 0408 02/09/18 0411  Weight: 77.1 kg 78 kg 76.2 kg     Examination:    General exam: Appears calm and comfortable  BP 136/88   Pulse (!) 53   Temp 98.4 F (36.9 C)  (Oral)   Resp (!) 22   Ht 5\' 6"  (1.676 m)   Wt 76.2 kg   SpO2 94%   BMI 27.13 kg/m    Physical Exam   Awake Alert, Oriented X 3, sitting in recliner in no apparent distress Symmetrical Chest wall movement, Good air movement bilaterally, bibasilar Rales Regular regular,No Gallops,Rubs or new Murmurs, No Parasternal Heave +ve B.Sounds, Abd Soft, No tenderness, No rebound - guarding or rigidity. No Cyanosis, Clubbing or edema, chronic lower extremity changes       LABs:  CBC Latest Ref Rng & Units 02/09/2018 02/06/2018 02/05/2018  WBC 4.0 - 10.5 K/uL 7.1 5.9 6.6  Hemoglobin 12.0 - 15.0 g/dL 13.7 12.2 14.1  Hematocrit 36.0 - 46.0 % 37.2 39.9 45.1  Platelets 150 - 400 K/uL 285 129(L) 155   CMP Latest Ref Rng & Units 02/09/2018 02/06/2018 02/05/2018  Glucose 70 - 99 mg/dL 186(H) 105(H) 128(H)  BUN 8 - 23 mg/dL 17 12 10   Creatinine 0.44 - 1.00 mg/dL 0.74 1.07(H) 0.70  Sodium 135 - 145 mmol/L 136 143 140  Potassium 3.5 - 5.1 mmol/L 3.8 3.7 3.8  Chloride 98 - 111 mmol/L 94(L) 104 102  CO2 22 - 32 mmol/L 30 31 29   Calcium 8.9 - 10.3 mg/dL 9.3 8.7(L) 9.2  Total Protein 6.0 - 8.3 g/dL - - -  Total Bilirubin 0.3 - 1.2 mg/dL - - -  Alkaline Phos 39 - 117 U/L - - -  AST 0 - 37 U/L - - -  ALT 0 - 35 U/L - - -     Phillips Climes MD [facility] Office number [phone]

## 2018-02-09 NOTE — Evaluation (Signed)
Physical Therapy Evaluation Patient Details Name: Joann Ford MRN: [id number] DOB: [birth year] Today's Date: 02/09/2018   History of Present Illness  72 yo female with onset of a-fib with RVR discovered during a preop screening for scoliosis surgery was admitted for TEE with unsuccessful cardioversion.  PT ordered to assess her with and without O2 for management of SOB.  PMHx:  aortic v stenosis, HLD, COPD, scoliosis, EF 55-60%, tobacco abuse,  Clinical Impression  Pt was seen for O2 sat OM:[id number] QUALIFICATIONS: (This note is used to comply with regulatory documentation for home oxygen)  Patient Saturations on Room Air at Rest = *93%  Patient Saturations on Room Air while Ambulating = 87%  Patient Saturations on 2 Liters of oxygen while Ambulating = NT%  Please briefly explain why patient needs home oxygen:  Requires O2 for maintaining 90+% of O2 which recovered and maintained once replaced.    Follow Up Recommendations Home health PT;Supervision for mobility/OOB    Equipment Recommendations  None recommended by PT    Recommendations for Other Services       Precautions / Restrictions Precautions Precautions: Fall Precaution Comments: ck O2 with mobility on and off supplementation Restrictions Weight Bearing Restrictions: No      Mobility  Bed Mobility               General bed mobility comments: up in chair when PT arrived  Transfers Overall transfer level: Modified independent Equipment used: Rolling walker (2 wheeled)             General transfer comment: pt can self steady on RW  Ambulation/Gait Ambulation/Gait assistance: Min guard Gait Distance (Feet): 5 Feet Assistive device: Rolling walker (2 wheeled);1 person hand held assist Gait Pattern/deviations: Step-through pattern;Shuffle;Trunk flexed;Wide base of support;Decreased stride length     General Gait Details: Pt removed O2 and noted sats were stable sitting but with gait dropped to  87% at rest  Stairs            Wheelchair Mobility    Modified Rankin (Stroke Patients Only)       Balance Overall balance assessment: Needs assistance Sitting-balance support: Feet supported Sitting balance-Leahy Scale: Good     Standing balance support: Bilateral upper extremity supported;During functional activity Standing balance-Leahy Scale: Fair                               Pertinent Vitals/Pain Pain Assessment: No/denies pain    Home Living Family/patient expects to be discharged to:: Private residence Living Arrangements: Spouse/significant other Available Help at Discharge: Family;Available 24 hours/day Type of Home: House Home Access: Stairs to enter Entrance Stairs-Rails: None Entrance Stairs-Number of Steps: 1 Home Layout: One level Home Equipment: Walker - 2 wheels;Walker - 4 wheels      Prior Function Level of Independence: Independent with assistive device(s)         Comments: has been working and driving     Hand Dominance   Dominant Hand: Right    Extremity/Trunk Assessment   Upper Extremity Assessment Upper Extremity Assessment: Overall WFL for tasks assessed    Lower Extremity Assessment Lower Extremity Assessment: Overall WFL for tasks assessed    Cervical / Trunk Assessment Cervical / Trunk Assessment: Other exceptions(severe scoliosis with shift to L side)  Communication   Communication: No difficulties  Cognition Arousal/Alertness: Awake/alert Behavior During Therapy: WFL for tasks assessed/performed Overall Cognitive Status: Within Functional Limits for tasks assessed  General Comments      Exercises     Assessment/Plan    PT Assessment Patient needs continued PT services  PT Problem List Decreased range of motion;Decreased activity tolerance;Decreased balance;Decreased mobility;Decreased coordination;Decreased safety awareness;Cardiopulmonary  status limiting activity       PT Treatment Interventions DME instruction;Gait training;Stair training;Functional mobility training;Therapeutic activities;Therapeutic exercise;Balance training;Neuromuscular re-education;Patient/family education    PT Goals (Current goals can be found in the Care Plan section)  Acute Rehab PT Goals Patient Stated Goal: to walk and get home PT Goal Formulation: With patient Time For Goal Achievement: 02/23/18 Potential to Achieve Goals: Good    Frequency Min 2X/week   Barriers to discharge   home is accessible with husband there    Co-evaluation               AM-PAC PT "6 Clicks" Mobility  Outcome Measure Help needed turning from your back to your side while in a flat bed without using bedrails?: None Help needed moving from lying on your back to sitting on the side of a flat bed without using bedrails?: A Little Help needed moving to and from a bed to a chair (including a wheelchair)?: A Little Help needed standing up from a chair using your arms (e.g., wheelchair or bedside chair)?: A Little Help needed to walk in hospital room?: A Little Help needed climbing 3-5 steps with a railing? : A Lot 6 Click Score: 18    End of Session Equipment Utilized During Treatment: Gait belt;Oxygen Activity Tolerance: Patient limited by fatigue;Treatment limited secondary to medical complications (Comment) Patient left: with call bell/phone within reach;in chair Nurse Communication: Mobility status(shared results of gait with no O2) PT Visit Diagnosis: Unsteadiness on feet (R26.81);Dizziness and giddiness (R42);Difficulty in walking, not elsewhere classified (R26.2)    Time: 0174-9449 PT Time Calculation (min) (ACUTE ONLY): 25 min   Charges:   PT Evaluation $PT Eval Moderate Complexity: 1 Mod PT Treatments $Gait Training: 8-22 mins       Ramond Dial 02/09/2018, 6:02 PM   Mee Hives, PT MS Acute Rehab Dept. Number: [place] and [place]

## 2018-02-10 DIAGNOSIS — R0609 Other forms of dyspnea: Secondary | ICD-10-CM

## 2018-02-10 LAB — CBC
HCT: 43.2 % (ref 36.0–46.0)
Hemoglobin: 13.7 g/dL (ref 12.0–15.0)
MCH: 29.8 pg (ref 26.0–34.0)
MCHC: 31.7 g/dL (ref 30.0–36.0)
MCV: 93.9 fL (ref 80.0–100.0)
NRBC: 0 % (ref 0.0–0.2)
PLATELETS: 152 10*3/uL (ref 150–400)
RBC: 4.6 MIL/uL (ref 3.87–5.11)
RDW: 14.2 % (ref 11.5–15.5)
WBC: 8.5 10*3/uL (ref 4.0–10.5)

## 2018-02-10 LAB — BASIC METABOLIC PANEL
Anion gap: 9 (ref 5–15)
BUN: 15 mg/dL (ref 8–23)
CO2: 34 mmol/L — ABNORMAL HIGH (ref 22–32)
CREATININE: 0.81 mg/dL (ref 0.44–1.00)
Calcium: 8.9 mg/dL (ref 8.9–10.3)
Chloride: 99 mmol/L (ref 98–111)
GFR calc Af Amer: >60 mL/min (ref >60)
GFR calc non Af Amer: >60 mL/min (ref >60)
Glucose, Bld: 95 mg/dL (ref 70–99)
Potassium: 4 mmol/L (ref 3.5–5.1)
Sodium: 142 mmol/L (ref 135–145)

## 2018-02-10 MED ORDER — FUROSEMIDE 40 MG PO TABS
40.0000 mg | ORAL_TABLET | Freq: Every day | ORAL | Status: DC
  Administered 2018-02-10: 40 mg via ORAL
  Filled 2018-02-10: qty 1

## 2018-02-10 MED ORDER — FUROSEMIDE 40 MG PO TABS: 40.0000 mg | ORAL_TABLET | Freq: Every day | ORAL | 0 refills | Status: DC

## 2018-02-10 MED ORDER — DILTIAZEM HCL ER COATED BEADS 240 MG PO CP24: 240.0000 mg | ORAL_CAPSULE | Freq: Every day | ORAL | 0 refills | Status: DC

## 2018-02-10 MED ORDER — APIXABAN 5 MG PO TABS: 5.0000 mg | ORAL_TABLET | Freq: Two times a day (BID) | ORAL | 0 refills | Status: DC

## 2018-02-10 NOTE — Care Management Note (Signed)
Case Management Note  Patient Details  Name: Joann Ford MRN: [id number] Date of Birth: [birth year]  Subjective/Objective:                    Action/Plan:  Spoke w patient and spouse at bedside to discuss [facility] needs. From the Medicare list they chose [facility]. Referral accepted by Glyn Ade RN hospital liaison. They decline needs for DME. ALso provided with Eliquis 30 day coupon card. No other CM needs identified.   Expected Discharge Date:  02/10/18               Expected Discharge Plan:  [place]  In-House Referral:     Discharge planning Services  CM Consult  Post Acute Care Choice:  Home Health Choice offered to:  Patient  DME Arranged:    DME Agency:     HH Arranged:  PT HH Agency:  [facility]  Status of Service:  Completed, signed off  If discussed at [place] of Stay Meetings, dates discussed:    Additional Comments:  Carles Collet, RN 02/10/2018, 10:52 AM

## 2018-02-10 NOTE — Progress Notes (Signed)
Progress Note  Patient Name: Joann Ford Date of Encounter: 02/10/2018  Primary Cardiologist: Pixie Casino, MD   Subjective   Pt denies dyspnea and no CP   Inpatient Medications    Scheduled Meds: . apixaban  5 mg Oral BID  . diltiazem  240 mg Oral Daily  . escitalopram  20 mg Oral Daily  . furosemide  40 mg Intravenous BID  . gabapentin  300 mg Oral Daily  . ipratropium-albuterol  3 mL Nebulization Once  . mouth rinse  15 mL Mouth Rinse BID  . morphine  15 mg Oral TID  . rosuvastatin  20 mg Oral q1800  . sodium chloride flush  3 mL Intravenous Q12H  . umeclidinium bromide  1 puff Inhalation Daily   Continuous Infusions: . sodium chloride Stopped (02/08/18 0932)   PRN Meds: acetaminophen, albuterol, ondansetron (ZOFRAN) IV, sodium chloride flush   Vital Signs    Vitals:   02/09/18 1124 02/09/18 1930 02/10/18 0011 02/10/18 0504  BP:  98/63 (!) 100/43 (!) 113/57  Pulse: (!) 53 100 65 66  Resp:      Temp: 98.4 F (36.9 C) 97.7 F (36.5 C) 98.3 F (36.8 C) 97.9 F (36.6 C)  TempSrc: Oral Oral Oral Oral  SpO2: 94% 93% 90% 90%  Weight:    75.2 kg  Height:        Intake/Output Summary (Last 24 hours) at 02/10/2018 0809 Last data filed at 02/10/2018 0507 Gross per 24 hour  Intake 700 ml  Output 1450 ml  Net -750 ml   Filed Weights   02/08/18 0408 02/09/18 0411 02/10/18 0504  Weight: 78 kg 76.2 kg 75.2 kg    Telemetry    Atrial fibrillation, rate mildly elevated at times but relatively well controlled- Personally Reviewed   Physical Exam   GEN: NAD Neck: supple Cardiac: irregular Respiratory: Diminished BS throughout; no wheeze GI: Soft, NT/ND, no masses MS: trace edema Neuro:  no focal findings  Labs    Chemistry Recent Labs  Lab 02/06/18 0411 02/09/18 0407 02/10/18 0430  NA 143 136 142  K 3.7 3.8 4.0  CL 104 94* 99  CO2 31 30 34*  GLUCOSE 105* 186* 95  BUN 12 17 15   CREATININE 1.07* 0.74 0.81  CALCIUM 8.7* 9.3 8.9    GFRNONAA 52* >60 >60  GFRAA >60 >60 >60  ANIONGAP 8 12 9      Hematology Recent Labs  Lab 02/06/18 0411 02/09/18 0407 02/10/18 0430  WBC 5.9 7.1 8.5  RBC 4.14 4.11 4.60  HGB 12.2 13.7 13.7  HCT 39.9 37.2 43.2  MCV 96.4 90.5 93.9  MCH 29.5 33.3 29.8  MCHC 30.6 36.8* 31.7  RDW 14.6 14.4 14.2  PLT 129* 285 152    Cardiac Enzymes Recent Labs  Lab 02/05/18 1143  TROPONINI 0.03*    BNP Recent Labs  Lab 02/05/18 1143  BNP 847.2*      Cardiac Studies   Echocardiogram April 2019 showed normal LV function, mild aortic stenosis with mean gradient 15 mmHg, trace aortic insufficiency and mild left atrial enlargement.  Nuclear study April 2019 showed ejection fraction 61% with mild ischemia in the distal inferior lateral wall/apex.  Treated medically.  Patient Profile     72 y.o. female with a hx of hyperlipidemia, COPD followed by Dr. Lamonte Sakai, Thoracics kyphosis, anxiety, arthritis, arteriosclerosis and ongoing tobacco smoking with 40-pack-year historywho is being seen today for the evaluation of atrial fibrillation with rapid ventricular rate.  Assessment & Plan    1 persistent atrial fibrillation-previous TEE without LAA thrombus but DCCV unsuccessful. Plan rate control and anticoagulation for now. If she has worsening symptoms despite adequate rate control, will likely need tikosyn followed by repeat attempt at DCCV. HR improved today; continue present dose of cardizem; continue apixaban.   2 COPD-continue bronchodilators.  Per IM. Beta blocker DCed previously.  3 tobacco abuse-cessation advised previously.  4 history of mild aortic stenosis-we will need follow-up echoes in the future.  5 acute diastolic CHF-volume status improved; change lasix to 40 mg daily.  6 mild to moderate LV dysfunction-likely related to atrial fibrillation with RVR; will not add beta blocker given COPD; repeat echo in 3 months once HR conrolled.  Patient can be discharged from a cardiac  standpoint.  [facility] will sign off.   Medication Recommendations: Continue present medications as listed in MAR. Other recommendations (labs, testing, etc): Check potassium and renal function 1 week following discharge. Follow up as an outpatient: I will arrange follow-up transition of care appointment in atrial fibrillation clinic in 1 week.  Follow-up Dr. Debara Pickett 8 to 12 weeks.  For questions or updates, please contact [place] Please consult [email or web address] for contact info under        Signed, Kirk Ruths, MD  02/10/2018, 8:09 AM

## 2018-02-10 NOTE — Care Management Important Message (Signed)
Important Message  Patient Details  Name: Joann Ford MRN: [id number] Date of Birth: May 19, 1945   Medicare Important Message Given:  Yes    Laniyah Rosenwald P [place] 02/10/2018, 12:25 PM

## 2018-02-10 NOTE — Discharge Summary (Addendum)
Physician Discharge Summary  Joann Ford UVO:[id number] DOB: [birth year] DOA: 02/05/2018  PCP: Magda Kiel Duke, PA-C  Admit date: 02/05/2018 Discharge date: 02/10/2018  Time spent: 45 minutes  Recommendations for Outpatient Follow-up:  Patient will be discharged to home with home health, physical therapy.  Patient will need to follow up with primary care provider within one week of discharge, repeat BMP. Follow up with Atrial fibrillation clinic in one week.  Patient should continue medications as prescribed.  Patient should follow a heart healthy diet.   Discharge Diagnoses:  Persistent atrial fibrillation with RVR Acute combined diastolic and systolic CHF COPD Aortic valve stenosis Hyperlipidemia Tobacco abuse Chronic pain syndrome  Discharge Condition: stable  Diet recommendation: heart healthy  Filed Weights   02/08/18 0408 02/09/18 0411 02/10/18 0504  Weight: 78 kg 76.2 kg 75.2 kg    History of present illness:  On 02/05/2018 by Dr. Phillips Climes Joann Ford  is a 72 y.o. female, with past medical history of aortic valve stenosis, hyperlipidemia, tobacco abuse, COPD, scoliosis, was seen recently by cardiology as an outpatient for preop evaluation for scoliosis surgery, low risk stress test, EF 55 to 60%, with very mild stenosis of the aortic valve, since with shortness of breath, reports this is progressive for last couple weeks, but significantly worsened over last 3 days, mainly exertional, as were reports some orthopnea, denies any chest pain, fever, chills, cough, hemoptysis, leg pain, leg swelling, he does report some palpitation. -In ED she was noted to be in A. fib with RVR, appears new onset, heart rate in the 140s to 150s, she required Cardizem drip, first troponin was negative, no significant lab abnormalities, TSH is pending, we were called to admit  Hospital Course:  Persistent atrial fibrillation with RVR -New onset A. Fib -Cardiology consulted and  appreciated -TSH within normal limits -Currently on Eliquis for anticoagulation -Required Cardizem drip initially however heart rate was controlled and she was transitioned to oral Cardizem -TEE with cardioversion on 02/08/2018-no atrial thrombus noted on TEE -DCCV x3, sinus rhythm was not achieved -Cardiology currently recommending cardizem 240mg  daily , repeat BMP in one week, outpatient follow up with in AF clinic in one week, follow up with Dr. Debara Pickett in 8-12 weeks  Acute combined diastolic and systolic CHF -Echocardiogram showed an EF of 40 to 45% -Not on beta-blockers given her severe COPD -related to AF -Continue Lasix 40mg  PO daily  COPD -No active wheezing -continue home medications  Aortic valve stenosis -cardiology managing  Hyperlipidemia -Continue statin  Tobacco abuse -Cessation discussed -continue nicotine patch  Chronic pain syndrome -continue home meds   Procedures: TEE with DCCV x3, 02/08/2018  Consultations: Cardiology  Discharge Exam: Vitals:   02/10/18 0846 02/10/18 0953  BP:  (!) 134/98  Pulse:    Resp:    Temp:    SpO2: 92%      General: Well developed, well nourished, NAD, appears stated age  HEENT: NCAT, mucous membranes moist.  Neck: Supple  Cardiovascular: S1 S2 auscultated, irregularly irregular, +SEM  Respiratory: Clear to auscultation bilaterally with equal chest rise  Abdomen: Soft, nontender, nondistended, + bowel sounds  Extremities: warm dry without cyanosis clubbing. Trace LE edema  Neuro: AAOx3, nonfocal  Psych: Normal affect and demeanor, pleasant   Discharge Instructions Discharge Instructions    Discharge instructions   Complete by:  As directed    Patient will be discharged to home.  Patient will need to follow up with primary care provider within one week of  discharge, repeat BMP. Follow up with Atrial fibrillation clinic in one week.  Patient should continue medications as prescribed.  Patient should follow  a heart healthy diet.     Allergies as of 02/10/2018      Reactions   Cefadroxil Swelling   Throat closes   Atorvastatin Other (See Comments)   Possible SAMS - 40 mg dose   Ciprofloxacin    Throat closes   Penicillins Swelling   throat closes      Medication List    TAKE these medications   acetaminophen 500 MG tablet Commonly known as:  TYLENOL Take 1,000 mg by mouth every 6 (six) hours as needed for mild pain.   albuterol (2.5 MG/3ML) 0.083% nebulizer solution Commonly known as:  PROVENTIL Take 3 mLs (2.5 mg total) by nebulization every 6 (six) hours as needed for wheezing or shortness of breath.   apixaban 5 MG Tabs tablet Commonly known as:  ELIQUIS Take 1 tablet (5 mg total) by mouth 2 (two) times daily.   diltiazem 240 MG 24 hr capsule Commonly known as:  CARDIZEM CD Take 1 capsule (240 mg total) by mouth daily. Start taking on:  02/11/2018   diphenhydramine-acetaminophen 25-500 MG Tabs tablet Commonly known as:  TYLENOL PM Take 1 tablet by mouth at bedtime.   escitalopram 20 MG tablet Commonly known as:  LEXAPRO Take 1 tablet (20 mg total) by mouth daily.   furosemide 40 MG tablet Commonly known as:  LASIX Take 1 tablet (40 mg total) by mouth daily. Start taking on:  02/11/2018   gabapentin 100 MG capsule Commonly known as:  NEURONTIN Take 300 mg by mouth daily.   morphine 15 MG tablet Commonly known as:  MSIR Take 15 mg by mouth 3 (three) times daily as needed for moderate pain.   morphine 15 MG 12 hr tablet Commonly known as:  MS CONTIN Take 15 mg by mouth 2 (two) times daily.   rosuvastatin 20 MG tablet Commonly known as:  CRESTOR Take 1 tablet (20 mg total) by mouth daily.   SYMBICORT 80-4.5 MCG/ACT inhaler Generic drug:  budesonide-formoterol Inhale 2 puffs into the lungs daily as needed for shortness of breath.   Tiotropium Bromide Monohydrate 2.5 MCG/ACT Aers Inhale 2 puffs into the lungs daily.      Allergies  Allergen Reactions   . Cefadroxil Swelling    Throat closes  . Atorvastatin Other (See Comments)    Possible SAMS - 40 mg dose  . Ciprofloxacin     Throat closes  . Penicillins Swelling    throat closes   Follow-up Information    Hedgecock, Ashlyn Duke, PA-C. Schedule an appointment as soon as possible for a visit in 1 week(s).   Specialty:  Hematology and Oncology Why:  Hospital follow up Contact information: [place] [address] [phone]        Pixie Casino, MD. Schedule an appointment as soon as possible for a visit in 1 week(s).   Specialty:  Cardiology Why:  Atrial fibrillation clinic Contact information: [place] [address] [phone]            The results of significant diagnostics from this hospitalization (including imaging, microbiology, ancillary and laboratory) are listed below for reference.    Significant Diagnostic Studies: Dg Chest 2 View  Result Date: 02/05/2018 CLINICAL DATA:  Shortness of breath for 5 days. EXAM: CHEST - 2 VIEW COMPARISON:  07/10/2014 FINDINGS: Marked thoracolumbar scoliosis distorts  cardio thoracic anatomy. Cardiopericardial silhouette is at upper limits of normal for size. Underlying diffuse chronic interstitial lung disease evident. Patchy opacity at the right base may be atelectasis or infiltrate. Lucency under the right hemidiaphragm is probably related to gaseous bowel distention. Bones are diffusely demineralized. Telemetry leads overlie the chest.The heart size and mediastinal contours are within normal limits. Both lungs are clear. The visualized skeletal structures are unremarkable. IMPRESSION: 1. Underlying diffuse interstitial lung disease, likely chronic. Component of superimposed interstitial edema cannot be excluded. 2. Right base atelectasis or infiltrate. 3. Lucency under the right hemidiaphragm is probably related to gaseous distention of bowel. If there is clinical concern for  hollow viscus perforation, decubitus films recommended to further evaluate. Electronically Signed   By: Misty Stanley M.D.   On: 02/05/2018 12:32   Dg Chest Port 1 View  Result Date: 02/08/2018 CLINICAL DATA:  Dyspnea EXAM: PORTABLE CHEST 1 VIEW COMPARISON:  02/05/2018, 07/10/2014 FINDINGS: Small bilateral pleural effusions. Bibasilar airspace disease. Prominent cardiomediastinal silhouette with vascular congestion. Aortic atherosclerosis. No pneumothorax. IMPRESSION: 1. Development of bilateral pleural effusions and worsening bibasilar airspace disease which may reflect atelectasis or pneumonia 2. Probable enlargement of the cardiomediastinal silhouette with vascular congestion. Electronically Signed   By: Donavan Foil M.D.   On: 02/08/2018 16:23    Microbiology: Recent Results (from the past 240 hour(s))  MRSA PCR Screening     Status: None   Collection Time: 02/05/18  5:12 PM  Result Value Ref Range Status   MRSA by PCR NEGATIVE NEGATIVE Final    Comment:        The GeneXpert MRSA Assay (FDA approved for NASAL specimens only), is one component of a comprehensive MRSA colonization surveillance program. It is not intended to diagnose MRSA infection nor to guide or monitor treatment for MRSA infections. Performed at [facility], [place] [address] [place], [place] [address]      Labs: Basic Metabolic Panel: Recent Labs  Lab 02/05/18 1143 02/06/18 0411 02/09/18 0407 02/10/18 0430  NA 140 143 136 142  K 3.8 3.7 3.8 4.0  CL 102 104 94* 99  CO2 29 31 30  34*  GLUCOSE 128* 105* 186* 95  BUN 10 12 17 15   CREATININE 0.70 1.07* 0.74 0.81  CALCIUM 9.2 8.7* 9.3 8.9   Liver Function Tests: No results for input(s): AST, ALT, ALKPHOS, BILITOT, PROT, ALBUMIN in the last 168 hours. No results for input(s): LIPASE, AMYLASE in the last 168 hours. No results for input(s): AMMONIA in the last 168 hours. CBC: Recent Labs  Lab 02/05/18 1143 02/06/18 0411 02/09/18 0407  02/10/18 0430  WBC 6.6 5.9 7.1 8.5  NEUTROABS 5.3  --   --   --   HGB 14.1 12.2 13.7 13.7  HCT 45.1 39.9 37.2 43.2  MCV 96.0 96.4 90.5 93.9  PLT 155 129* 285 152   Cardiac Enzymes: Recent Labs  Lab 02/05/18 1143  TROPONINI 0.03*   BNP: BNP (last 3 results) Recent Labs    02/05/18 1143  BNP 847.2*    ProBNP (last 3 results) No results for input(s): PROBNP in the last 8760 hours.  CBG: No results for input(s): GLUCAP in the last 168 hours.     Signed:  Cristal Ford  [facility] 02/10/2018, 10:13 AM

## 2018-02-16 ENCOUNTER — Ambulatory Visit (HOSPITAL_COMMUNITY): Payer: Medicare HMO | Admitting: Nurse Practitioner

## 2018-02-26 ENCOUNTER — Ambulatory Visit: Payer: Medicare HMO | Admitting: Physician Assistant

## 2018-02-26 DIAGNOSIS — I519 Heart disease, unspecified: Secondary | ICD-10-CM | POA: Diagnosis not present

## 2018-02-26 DIAGNOSIS — E785 Hyperlipidemia, unspecified: Secondary | ICD-10-CM | POA: Diagnosis not present

## 2018-02-26 DIAGNOSIS — I4819 Other persistent atrial fibrillation: Secondary | ICD-10-CM

## 2018-02-26 MED ORDER — FUROSEMIDE 40 MG PO TABS: 40.0000 mg | ORAL_TABLET | Freq: Every day | ORAL | 3 refills | Status: DC

## 2018-02-26 MED ORDER — DILTIAZEM HCL ER COATED BEADS 240 MG PO CP24: 240.0000 mg | ORAL_CAPSULE | Freq: Every day | ORAL | 3 refills | Status: DC

## 2018-02-26 MED ORDER — APIXABAN 5 MG PO TABS: 5.0000 mg | ORAL_TABLET | Freq: Two times a day (BID) | ORAL | 2 refills | Status: DC

## 2018-02-26 NOTE — Progress Notes (Addendum)
Cardiology Office Note    Date:  03/01/2018   ID:  Joann Ford, DOB [birth year], MRN [id number]  PCP:  Ardith Dark, PA-C  Cardiologist: Dr. Debara Pickett  Chief Complaint  Patient presents with  . Follow-up    seen for Dr. Debara Pickett.     History of Present Illness:  Joann Ford is a 72 y.o. female with past medical history of tobacco abuse, hyperlipidemia, anxiety, osteoporosis with significant thoracic kyphosis and history of arthritis.  She had a CT scan of her thoracic and lumbar spine in November 2018, this revealed a significant coronary artery calcification and aortic atherosclerosis.  She was last seen by Dr. Debara Pickett in May 2019 as part of her preoperative risk assessment for possible spine surgery.  Her echocardiogram at the time demonstrated EF 55 to 60%, very mild stenosis of the aortic valve.  Myoview was low risk however there was a small area of apical inferior and apical lateral ischemia.  Since she was completely asymptomatic, therefore it was recommended for the patient to treat this medically.  She was cleared for the surgery.  She was started on Lipitor, however was intolerant of this medication.  She was switched to Crestor 20 mg daily.  More recently, patient was admitted to the hospital on 02/05/2018 with intermittent palpitation and worsening shortness of breath and dizziness.  Initial EKG showed new atrial fibrillation with RVR.  Troponin was borderline.  BNP was also elevated at 847.  Chest x-ray showed chronic interstitial lung disease with superimposed pulmonary edema.  Patient was treated with rate control.  Eliquis 5 mg twice daily was added to her medical regimen.  She ultimately underwent TEE DCCV by Dr. Debara Pickett on 02/08/2018.  TEE revealed EF 40 to 45%, mild aortic stenosis with mild regurgitation, mild LAE, no significant thrombus was noted, therefore patient underwent 3 shock, however cardioversion was unsuccessful.  She was also unable to lay flat, therefore it was  recommended to diurese the patient further.  No beta-blocker was added during this admission given history of COPD.  Her discharge weight was 209 pounds.  Patient presents today for cardiology office visit.  She remains in atrial fibrillation however has no cardiac awareness of this.  She denies any recent chest pain or shortness of breath.  She appears to be euvolemic on physical exam.  Since discharge, she has been seen by her PCP who obtained basic metabolic panel that showed her stable renal function and electrolyte.  I will continue her on the current diuretic.  We discussed various options including continue rate control versus consideration of antiarrhythmic therapy prior to cardioversion, for the time being, I recommended for her to continue rate control therapy.  Since she has COPD, amiodarone therapy is less ideal.  If she is more symptomatic down the road, we can potentially consider Tikosyn prior to cardioversion.  Past Medical History:  Diagnosis Date  . Anxiety   . Arthritis   . Chronic pain   . Hyperlipidemia   . Osteoporosis     Past Surgical History:  Procedure Laterality Date  . CARDIOVERSION N/A 02/08/2018   Procedure: CARDIOVERSION;  Surgeon: Pixie Casino, MD;  Location: [facility] ENDOSCOPY;  Service: Cardiovascular;  Laterality: N/A;  . I and D of boil  1998, 1999   bilateral axilla  . TEE WITHOUT CARDIOVERSION N/A 02/08/2018   Procedure: TRANSESOPHAGEAL ECHOCARDIOGRAM (TEE);  Surgeon: Pixie Casino, MD;  Location: [place];  Service: Cardiovascular;  Laterality: N/A;    Current  Medications: Outpatient Medications Prior to Visit  Medication Sig Dispense Refill  . diphenhydramine-acetaminophen (TYLENOL PM) 25-500 MG TABS tablet Take 1 tablet by mouth at bedtime.    Marland Kitchen escitalopram (LEXAPRO) 20 MG tablet Take 1 tablet (20 mg total) by mouth daily. 30 tablet 11  . gabapentin (NEURONTIN) 100 MG capsule Take 300 mg by mouth daily.     Marland Kitchen morphine (MS CONTIN) 15 MG 12 hr  tablet Take 15 mg by mouth 2 (two) times daily.    Marland Kitchen morphine (MSIR) 15 MG tablet Take 15 mg by mouth 3 (three) times daily as needed for moderate pain.    Marland Kitchen apixaban (ELIQUIS) 5 MG TABS tablet Take 1 tablet (5 mg total) by mouth 2 (two) times daily. 60 tablet 0  . budesonide-formoterol (SYMBICORT) 80-4.5 MCG/ACT inhaler Inhale 2 puffs into the lungs daily as needed for shortness of breath.    . diltiazem (CARDIZEM CD) 240 MG 24 hr capsule Take 1 capsule (240 mg total) by mouth daily. 30 capsule 0  . furosemide (LASIX) 40 MG tablet Take 1 tablet (40 mg total) by mouth daily. 30 tablet 0  . Tiotropium Bromide Monohydrate (SPIRIVA RESPIMAT) 2.5 MCG/ACT AERS Inhale 2 puffs into the lungs daily. 1 Inhaler 5  . acetaminophen (TYLENOL) 500 MG tablet Take 1,000 mg by mouth every 6 (six) hours as needed for mild pain.    Marland Kitchen albuterol (PROVENTIL) (2.5 MG/3ML) 0.083% nebulizer solution Take 3 mLs (2.5 mg total) by nebulization every 6 (six) hours as needed for wheezing or shortness of breath. (Patient not taking: Reported on 02/26/2018) 300 mL 5  . rosuvastatin (CRESTOR) 20 MG tablet Take 1 tablet (20 mg total) by mouth daily. (Patient not taking: Reported on 02/05/2018) 90 tablet 3   Facility-Administered Medications Prior to Visit  Medication Dose Route Frequency Provider Last Rate Last Dose  . 0.9 %  sodium chloride infusion  500 mL Intravenous Continuous Lafayette Dragon, MD         Allergies:   Cefadroxil; Atorvastatin; Ciprofloxacin; and Penicillins   Social History   Socioeconomic History  . Marital status: Married    Spouse name: Not on file  . Number of children: Not on file  . Years of education: Not on file  . Highest education level: Not on file  Occupational History  . Occupation: Emergency planning/management officer  Social Needs  . Financial resource strain: Not on file  . Food insecurity:    Worry: Not on file    Inability: Not on file  . Transportation needs:    Medical: Not on file    Non-medical: Not  on file  Tobacco Use  . Smoking status: Current Every Day Smoker    Packs/day: 1.00    Years: 40.00    Pack years: 40.00    Types: Cigarettes    Start date: 03/15/1965  . Smokeless tobacco: Never Used  Substance and Sexual Activity  . Alcohol use: No  . Drug use: No  . Sexual activity: Not on file  Lifestyle  . Physical activity:    Days per week: Not on file    Minutes per session: Not on file  . Stress: Not on file  Relationships  . Social connections:    Talks on phone: Not on file    Gets together: Not on file    Attends religious service: Not on file    Active member of club or organization: Not on file    Attends meetings of clubs or  organizations: Not on file    Relationship status: Not on file  Other Topics Concern  . Not on file  Social History Narrative   Exercise--  walk     Family History:  The patient's family history includes Cancer in her cousin and father; Pneumonia in her mother.   ROS:   Please see the history of present illness.    ROS All other systems reviewed and are negative.   PHYSICAL EXAM:   VS:  There were no vitals taken for this visit.   GEN: Well nourished, well developed, in no acute distress  HEENT: normal  Neck: no JVD, carotid bruits, or masses Cardiac: RRR; no murmurs, rubs, or gallops,no edema  Respiratory:  clear to auscultation bilaterally, normal work of breathing GI: soft, nontender, nondistended, + BS MS: no deformity or atrophy  Skin: warm and dry, no rash Neuro:  Alert and Oriented x 3, Strength and sensation are intact Psych: euthymic mood, full affect  Wt Readings from Last 3 Encounters:  02/10/18 165 lb 12.8 oz (75.2 kg)  07/21/17 166 lb 12.8 oz (75.7 kg)  06/23/17 164 lb (74.4 kg)      Studies/Labs Reviewed:   EKG:  EKG is ordered today.  The ekg ordered today demonstrates atrial fibrillation, heart rate 80  Recent Labs: 02/05/2018: B Natriuretic Peptide 847.2 02/06/2018: TSH 1.577 02/10/2018: BUN 15;  Creatinine, Ser 0.81; Hemoglobin 13.7; Platelets 152; Potassium 4.0; Sodium 142   Lipid Panel    Component Value Date/Time   CHOL 198 06/16/2017 1100   TRIG 120 06/16/2017 1100   HDL 51 06/16/2017 1100   CHOLHDL 3.9 06/16/2017 1100   CHOLHDL 3 09/29/2011 1120   VLDL 18.2 09/29/2011 1120   LDLCALC 123 (H) 06/16/2017 1100   LDLDIRECT 156.9 02/07/2008 1407    Additional studies/ records that were reviewed today include:   TEE 02/08/2018 LV EF: 40% -   45% Study Conclusions  - Left ventricle: Normal cavity size. Moderate LVH. Systolic   function was mildly to moderately reduced. The estimated ejection   fraction was in the range of 40% to 45%. Diffuse hypokinesis. No   evidence of thrombus. - Aortic valve: Trileaflet. Sclerotic. Mild stenosis and mild   regurgitation. - Aorta: Mild atheromatous disease. - Mitral valve: There was mild regurgitation. - Left atrium: The atrium was dilated. No evidence of thrombus in   the atrial cavity or appendage. No evidence of thrombus in the   atrial cavity or appendage. - Right atrium: The atrium was dilated. - Atrial septum: No defect or patent foramen ovale was identified. - Tricuspid valve: There was mild regurgitation.  Impressions:  - LVEF 40-45%, global hypokinesis. Unsuccessful cardioversion. No   cardiac source of emboli was indentified.   ASSESSMENT:    1. Persistent atrial fibrillation   2. Hyperlipidemia, unspecified hyperlipidemia type   3. LV dysfunction      PLAN:  In order of problems listed above:  1. Persistent atrial fibrillation: Recently underwent TEE, however cardioversion x3 were unsuccessful.  Patient currently does not have any cardiac awareness of atrial fibrillation, heart rate is very well controlled.  She is not a good candidate for amiodarone given her history of lung issue.  Tikosyn is a option if patient become more symptomatic in the future.  Obtain CBC at primary care provider's next visit to make  sure hemoglobin is stable on Eliquis.  2. 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  Her heart rate is better controlled, may  consider repeat echocardiogram to confirm improvement in EF, will defer this decision to Dr. Debara Pickett.  He appears to be euvolemic on physical exam.  3. Hyperlipidemia: Currently not taking any statin.  Annual lipid panel per primary care provider.    Medication Adjustments/Labs and Tests Ordered: Current medicines are reviewed at length with the patient today.  Concerns regarding medicines are outlined above.  Medication changes, Labs and Tests ordered today are listed in the Patient Instructions below. Patient Instructions  Medication Instructions:  The current medical regimen is effective;  continue present plan and medications.  If you need a refill on your cardiac medications before your next appointment, please call your pharmacy.   Lab work: CBC have ordered and completed at [facility]. If you have labs (blood work) drawn today and your tests are completely normal, you will receive your results only by: Marland Kitchen MyChart Message (if you have MyChart) OR . A paper copy in the mail If you have any lab test that is abnormal or we need to change your treatment, we will call you to review the results.  Follow-Up: At [facility], you and your health needs are our priority.  As part of our continuing mission to provide you with exceptional heart care, we have created designated Provider Care Teams.  These Care Teams include your primary Cardiologist (physician) and Advanced Practice Providers (APPs -  Physician Assistants and Nurse Practitioners) who all work together to provide you with the care you need, when you need it. Marland Kitchen Keep follow up with Dr.Hilty       Signed, Almyra Deforest, [place]  03/01/2018 12:24 AM    [address], [place], [place]  [address] Phone: [phone]; Fax: [phone]

## 2018-02-26 NOTE — Patient Instructions (Signed)
Medication Instructions:  The current medical regimen is effective;  continue present plan and medications.  If you need a refill on your cardiac medications before your next appointment, please call your pharmacy.   Lab work: CBC have ordered and completed at [facility]. If you have labs (blood work) drawn today and your tests are completely normal, you will receive your results only by: Marland Kitchen MyChart Message (if you have MyChart) OR . A paper copy in the mail If you have any lab test that is abnormal or we need to change your treatment, we will call you to review the results.  Follow-Up: At [facility], you and your health needs are our priority.  As part of our continuing mission to provide you with exceptional heart care, we have created designated Provider Care Teams.  These Care Teams include your primary Cardiologist (physician) and Advanced Practice Providers (APPs -  Physician Assistants and Nurse Practitioners) who all work together to provide you with the care you need, when you need it. Marland Kitchen Keep follow up with Dr.Hilty

## 2018-03-01 ENCOUNTER — Encounter: Payer: Self-pay | Admitting: Physician Assistant

## 2018-04-22 ENCOUNTER — Encounter: Payer: Self-pay | Admitting: Internal Medicine

## 2018-04-22 ENCOUNTER — Ambulatory Visit: Payer: Medicare HMO | Admitting: Internal Medicine

## 2018-04-22 VITALS — BP 112/72 | HR 44 | Ht 64.0 in | Wt 163.3 lb

## 2018-04-22 DIAGNOSIS — I5022 Chronic systolic (congestive) heart failure: Secondary | ICD-10-CM | POA: Diagnosis not present

## 2018-04-22 DIAGNOSIS — I350 Nonrheumatic aortic (valve) stenosis: Secondary | ICD-10-CM | POA: Diagnosis not present

## 2018-04-22 DIAGNOSIS — E785 Hyperlipidemia, unspecified: Secondary | ICD-10-CM

## 2018-04-22 DIAGNOSIS — I4819 Other persistent atrial fibrillation: Secondary | ICD-10-CM | POA: Diagnosis not present

## 2018-04-22 MED ORDER — ROSUVASTATIN CALCIUM 5 MG PO TABS: 5.0000 mg | ORAL_TABLET | ORAL | 3 refills | Status: DC

## 2018-04-22 NOTE — Progress Notes (Signed)
OFFICE CONSULT NOTE  Chief Complaint:  Hospital follow-up  Primary Care Physician: Ardith Dark, PA-C  HPI:  Joann Ford is a 73 y.o. female who is being seen today for the evaluation of shortness of breath and preoperative clearance at the request of Hedgecock, Massie Kluver,*. This is a 73 year old female with a 40-pack-year smoking history, who continues to smoke, history of dyslipidemia, anxiety, arthritis and osteoporosis with marked thoracic kyphosis.  Recently she was evaluated for possible spine straightening surgery.  Due to insurance reason she canceled any further surgery options and recently was seen for preoperative clearance and management of COPD by Dr. Lamonte Sakai.  He felt that she was acceptable risk for surgery, but she is being seen now for cardiovascular risk assessment.  She had a CT scan of the thoracic and lumbar spine in November 2018.  This showed significant coronary artery calcification and aortic atherosclerosis.  She has history of dyslipidemia but is not currently on a statin medication.  Symptomatically, she gets short of breath with minimal exertion.  She is not able to do a lot of activity due to her back problem.  There is no significant family history of heart disease in her first-degree relatives.  Does report a history of heart murmur.  07/21/2017  Joann Ford returns today for follow-up.  She underwent Lexiscan Myoview as well as echocardiogram for evaluation of murmur and preoperative risk assessment for possible spinal surgery.  Her echocardiogram demonstrated normal LVEF of 55 to 60% however there is very mild stenosis of the aortic valve.  Her Myoview stress test was low risk however there was a very small area of apical inferior and apical lateral ischemia.  Since she has been asymptomatic, I would recommend treating this medically.  Overall she is acceptable risk for back surgery should she choose to go through that.  We did also perform blood work  including a lipid profile.  This demonstrated an LDL of 123.  She was then started on atorvastatin 40 mg daily.  Unfortunately, she says she was intolerant of it after only 2 days on the medicine.  She said she developed severe joint pains that did get better after stopping the medicine.  This does not seem typical of SAMS.  04/22/2018  Joann Ford is seen today in follow-up.  She has recently been seen by Almyra Deforest, PA-C for persistent atrial fibrillation.  In December she was hospitalized with dyspnea and found to have an EF of 40 to 45% on echo with A. fib and RVR.  She underwent TEE guided cardioversion with multiple shocks that was not found to be effective.  She says she remains asymptomatic with her A. fib.  Heart rates in the low today in the 40s and is irregularly irregular.  She is on higher dose diltiazem.  Is not clear if she is symptomatic though with his heart rate.  He has not yet decided whether she wanted to have surgery to correct her significant kyphosis.  Did stop her statin medication and her LDL accordingly has gone up.  I suspect that she had myalgias to atorvastatin.  Her most recent lipid profile showed an LDL cholesterol 123.  Her goal LDL is less than 100 at least or perhaps even less than 70.  PMHx:  Past Medical History:  Diagnosis Date  . Anxiety   . Arthritis   . Chronic pain   . Hyperlipidemia   . Osteoporosis     Past Surgical History:  Procedure Laterality  Date  . CARDIOVERSION N/A 02/08/2018   Procedure: CARDIOVERSION;  Surgeon: Pixie Casino, MD;  Location: [facility] ENDOSCOPY;  Service: Cardiovascular;  Laterality: N/A;  . I and D of boil  1998, 1999   bilateral axilla  . TEE WITHOUT CARDIOVERSION N/A 02/08/2018   Procedure: TRANSESOPHAGEAL ECHOCARDIOGRAM (TEE);  Surgeon: Pixie Casino, MD;  Location: [facility] ENDOSCOPY;  Service: Cardiovascular;  Laterality: N/A;    FAMHx:  Family History  Problem Relation Age of Onset  . Cancer Father        Jaw Cancer  .  Pneumonia Mother   . Cancer Cousin        jaw  . Colon cancer Neg Hx   . Stomach cancer Neg Hx     SOCHx:   reports that she has been smoking cigarettes. She started smoking about 53 years ago. She has a 40.00 pack-year smoking history. She has never used smokeless tobacco. She reports that she does not drink alcohol or use drugs.  ALLERGIES:  Allergies  Allergen Reactions  . Cefadroxil Swelling    Throat closes  . Atorvastatin Other (See Comments)    Possible SAMS - 40 mg dose  . Ciprofloxacin     Throat closes  . Penicillins Swelling    throat closes    ROS: Pertinent items noted in HPI and remainder of comprehensive ROS otherwise negative.  HOME MEDS: Current Outpatient Medications on File Prior to Visit  Medication Sig Dispense Refill  . acetaminophen (TYLENOL) 500 MG tablet Take 1,000 mg by mouth every 6 (six) hours as needed for mild pain.    Marland Kitchen albuterol (PROVENTIL) (2.5 MG/3ML) 0.083% nebulizer solution Take 3 mLs (2.5 mg total) by nebulization every 6 (six) hours as needed for wheezing or shortness of breath. 300 mL 5  . apixaban (ELIQUIS) 5 MG TABS tablet Take 1 tablet (5 mg total) by mouth 2 (two) times daily. 60 tablet 2  . diltiazem (CARDIZEM CD) 240 MG 24 hr capsule Take 1 capsule (240 mg total) by mouth daily. 90 capsule 3  . diphenhydramine-acetaminophen (TYLENOL PM) 25-500 MG TABS tablet Take 1 tablet by mouth at bedtime.    Marland Kitchen escitalopram (LEXAPRO) 20 MG tablet Take 1 tablet (20 mg total) by mouth daily. 30 tablet 11  . furosemide (LASIX) 40 MG tablet Take 1 tablet (40 mg total) by mouth daily. 90 tablet 3  . gabapentin (NEURONTIN) 100 MG capsule Take 300 mg by mouth daily.     Marland Kitchen morphine (MS CONTIN) 15 MG 12 hr tablet     . morphine (MSIR) 15 MG tablet      Current Facility-Administered Medications on File Prior to Visit  Medication Dose Route Frequency Provider Last Rate Last Dose  . 0.9 %  sodium chloride infusion  500 mL Intravenous Continuous Lafayette Dragon, MD        LABS/IMAGING: No results found for this or any previous visit (from the past 48 hour(s)). No results found.  LIPID PANEL:    Component Value Date/Time   CHOL 198 06/16/2017 1100   TRIG 120 06/16/2017 1100   HDL 51 06/16/2017 1100   CHOLHDL 3.9 06/16/2017 1100   CHOLHDL 3 09/29/2011 1120   VLDL 18.2 09/29/2011 1120   LDLCALC 123 (H) 06/16/2017 1100   LDLDIRECT 156.9 02/07/2008 1407    WEIGHTS: Wt Readings from Last 3 Encounters:  04/22/18 163 lb 4.8 oz (74.1 kg)  02/10/18 165 lb 12.8 oz (75.2 kg)  07/21/17 166 lb 12.8 oz (  75.7 kg)    VITALS: BP 112/72   Pulse (!) 44   Ht 5\' 4"  (1.626 m)   Wt 163 lb 4.8 oz (74.1 kg)   BMI 28.03 kg/m   EXAM: General appearance: alert and no distress Neck: no carotid bruit, no JVD and thyroid not enlarged, symmetric, no tenderness/mass/nodules Lungs: diminished breath sounds bilaterally Heart: regular rate and rhythm, S1, S2 normal, no murmur, click, rub or gallop Abdomen: soft, non-tender; bowel sounds normal; no masses,  no organomegaly Extremities: extremities normal, atraumatic, no cyanosis or edema and Marked thoracic kyphosis Pulses: 2+ and symmetric Skin: Skin color, texture, turgor normal. No rashes or lesions Neurologic: Grossly normal Psych: Pleasant  EKG: Deferred  ASSESSMENT: 1. Acceptable preoperative risk for spine surgery -low risk Myoview stress test with a small area of inferolateral reversible ischemia, LVEF 61% (06/2017) 2. LVEF 40 to 45% by echo in the setting of A. fib with RVR 3. Persistent A. fib (failed cardioversion x3, 01/2018) 4. COPD 5. 40-pack-year smoker-ongoing 6. Mild aortic stenosis (06/2017) 7. Dyslipidemia - intolerant to atorvastatin 8. Coronary artery calcification  PLAN: 1.   Joann Ford still not decided whether she will want to go have surgery on her spine.  She did have a decreased LVEF in the setting of A. fib with RVR.  Her heart rate is better controlled now and she  seems to be asymptomatic but she failed cardioversion after multiple attempts.  I do not think she is a good candidate for antiarrhythmic therapy.  She does have some mild aortic stenosis which we will continue to follow.  She needs to quit smoking.  She has been intolerant to atorvastatin and her LDL is above goal.  I recommend starting rosuvastatin 5 mg every other day and we will repeat a lipid profile in 6 months and follow-up with me at that time.  Pixie Casino, MD, [facility], [place] Director of the Advanced Lipid Disorders &  Cardiovascular Risk Reduction Clinic Diplomate of the American Board of Clinical Lipidology Attending Cardiologist  Direct Dial: [phone]  Fax: [phone]  Website:  [email or web address]  04/22/2018, 12:20 PM

## 2018-04-22 NOTE — Patient Instructions (Signed)
Medication Instructions:  START ROSUVASTATIN 5 MG ONE TABLET EVERY OTHER DAY If you need a refill on your cardiac medications before your next appointment, please call your pharmacy.   Lab work: Your physician recommends that you return for lab work in: [place] If you have labs (blood work) drawn today and your tests are completely normal, you will receive your results only by: Marland Kitchen MyChart Message (if you have MyChart) OR . A paper copy in the mail If you have any lab test that is abnormal or we need to change your treatment, we will call you to review the results.  Follow-Up: At [facility], you and your health needs are our priority.  As part of our continuing mission to provide you with exceptional heart care, we have created designated Provider Care Teams.  These Care Teams include your primary Cardiologist (physician) and Advanced Practice Providers (APPs -  Physician Assistants and Nurse Practitioners) who all work together to provide you with the care you need, when you need it. You will need a follow up appointment in 6 months.  Please call our office 2 months in advance to schedule this appointment.  You may see Pixie Casino, MD or one of the following Advanced Practice Providers on your designated Care Team: [place], [place] . Fabian Sharp, PA-C

## 2018-04-30 ENCOUNTER — Other Ambulatory Visit: Payer: Self-pay

## 2018-04-30 DIAGNOSIS — E785 Hyperlipidemia, unspecified: Secondary | ICD-10-CM

## 2018-04-30 MED ORDER — ROSUVASTATIN CALCIUM 5 MG PO TABS: 5.0000 mg | ORAL_TABLET | ORAL | 3 refills | Status: DC

## 2018-04-30 MED ORDER — DILTIAZEM HCL ER COATED BEADS 240 MG PO CP24: 240.0000 mg | ORAL_CAPSULE | Freq: Every day | ORAL | 3 refills | Status: DC

## 2018-04-30 MED ORDER — FUROSEMIDE 40 MG PO TABS: 40.0000 mg | ORAL_TABLET | Freq: Every day | ORAL | 3 refills | Status: DC

## 2018-04-30 NOTE — Telephone Encounter (Signed)
Rx(s) sent to pharmacy electronically.  

## 2018-06-11 ENCOUNTER — Telehealth: Payer: Self-pay

## 2018-06-11 ENCOUNTER — Other Ambulatory Visit: Payer: Self-pay | Admitting: Internal Medicine

## 2018-06-11 MED ORDER — APIXABAN 5 MG PO TABS: 5.0000 mg | ORAL_TABLET | Freq: Two times a day (BID) | ORAL | 5 refills | Status: DC

## 2018-06-11 MED ORDER — APIXABAN 5 MG PO TABS: 5.0000 mg | ORAL_TABLET | Freq: Two times a day (BID) | ORAL | 6 refills | Status: DC

## 2018-06-11 NOTE — Telephone Encounter (Signed)
 *  STAT* If patient is at the pharmacy, call can be transferred to refill team.   1. Which medications need to be refilled? (please list name of each medication and dose if known) apixaban (ELIQUIS) 5 MG TABS tablet  2. Which pharmacy/location (including street and city if local pharmacy) is medication to be sent to? [facility], [address]  3. Do they need a 30 day or 90 day supply? 30 days

## 2018-06-11 NOTE — Addendum Note (Signed)
Addended by: Levonne Hubert on: 06/11/2018 10:10 AM   Modules accepted: Orders

## 2018-06-11 NOTE — Telephone Encounter (Signed)
72yo, 74.1kg, Scr 0.81 on 02/10/18 Last OV 04/22/18 Indication afib

## 2018-07-19 ENCOUNTER — Other Ambulatory Visit: Payer: Self-pay | Admitting: Internal Medicine

## 2018-11-10 ENCOUNTER — Telehealth: Payer: Self-pay | Admitting: Internal Medicine

## 2018-11-10 NOTE — Telephone Encounter (Signed)
Patient called stating she needs to bring her husband to her appt on 9/14, she states she has trouble remembering things and he always comes to all of her appts.

## 2018-11-10 NOTE — Telephone Encounter (Signed)
[place] for one family member to accompany patient.  Dr Lemmie Evens

## 2018-11-10 NOTE — Telephone Encounter (Signed)
Noted in appointment comments.

## 2018-11-15 ENCOUNTER — Ambulatory Visit (INDEPENDENT_AMBULATORY_CARE_PROVIDER_SITE_OTHER): Payer: Medicare HMO | Admitting: Internal Medicine

## 2018-11-15 ENCOUNTER — Other Ambulatory Visit: Payer: Self-pay

## 2018-11-15 VITALS — BP 102/66 | HR 87 | Ht 64.0 in | Wt 160.0 lb

## 2018-11-15 DIAGNOSIS — I350 Nonrheumatic aortic (valve) stenosis: Secondary | ICD-10-CM | POA: Diagnosis not present

## 2018-11-15 DIAGNOSIS — I5022 Chronic systolic (congestive) heart failure: Secondary | ICD-10-CM | POA: Diagnosis not present

## 2018-11-15 DIAGNOSIS — E785 Hyperlipidemia, unspecified: Secondary | ICD-10-CM | POA: Diagnosis not present

## 2018-11-15 DIAGNOSIS — I4819 Other persistent atrial fibrillation: Secondary | ICD-10-CM | POA: Diagnosis not present

## 2018-11-15 NOTE — Patient Instructions (Signed)
Medication Instructions:  Your physician recommends that you continue on your current medications as directed. Please refer to the Current Medication list given to you today.  If you need a refill on your cardiac medications before your next appointment, please call your pharmacy.   Lab work: FASTING lipid panel to check cholesterol  If you have labs (blood work) drawn today and your tests are completely normal, you will receive your results only by: Marland Kitchen MyChart Message (if you have MyChart) OR . A paper copy in the mail If you have any lab test that is abnormal or we need to change your treatment, we will call you to review the results.  Testing/Procedures: NONE  Follow-Up: At [facility], you and your health needs are our priority.  As part of our continuing mission to provide you with exceptional heart care, we have created designated Provider Care Teams.  These Care Teams include your primary Cardiologist (physician) and Advanced Practice Providers (APPs -  Physician Assistants and Nurse Practitioners) who all work together to provide you with the care you need, when you need it. You will need a follow up appointment in 12 months.  Please call our office 2 months in advance to schedule this appointment.  You may see Pixie Casino, MD or one of the following Advanced Practice Providers on your designated Care Team: [place], [place] . Fabian Sharp, PA-C  Any Other Special Instructions Will Be Listed Below (If Applicable).

## 2018-11-15 NOTE — Progress Notes (Signed)
OFFICE CONSULT NOTE  Chief Complaint:  Hospital follow-up  Primary Care Physician: Ardith Dark, PA-C  HPI:  Joann Ford is a 73 y.o. female who is being seen today for the evaluation of shortness of breath and preoperative clearance at the request of Ardith Dark, [place]. This is a 73 year old female with a 40-pack-year smoking history, who continues to smoke, history of dyslipidemia, anxiety, arthritis and osteoporosis with marked thoracic kyphosis.  Recently she was evaluated for possible spine straightening surgery.  Due to insurance reason she canceled any further surgery options and recently was seen for preoperative clearance and management of COPD by Dr. Lamonte Sakai.  He felt that she was acceptable risk for surgery, but she is being seen now for cardiovascular risk assessment.  She had a CT scan of the thoracic and lumbar spine in November 2018.  This showed significant coronary artery calcification and aortic atherosclerosis.  She has history of dyslipidemia but is not currently on a statin medication.  Symptomatically, she gets short of breath with minimal exertion.  She is not able to do a lot of activity due to her back problem.  There is no significant family history of heart disease in her first-degree relatives.  Does report a history of heart murmur.  07/21/2017  Joann Ford returns today for follow-up.  She underwent Lexiscan Myoview as well as echocardiogram for evaluation of murmur and preoperative risk assessment for possible spinal surgery.  Her echocardiogram demonstrated normal LVEF of 55 to 60% however there is very mild stenosis of the aortic valve.  Her Myoview stress test was low risk however there was a very small area of apical inferior and apical lateral ischemia.  Since she has been asymptomatic, I would recommend treating this medically.  Overall she is acceptable risk for back surgery should she choose to go through that.  We did also perform blood work  including a lipid profile.  This demonstrated an LDL of 123.  She was then started on atorvastatin 40 mg daily.  Unfortunately, she says she was intolerant of it after only 2 days on the medicine.  She said she developed severe joint pains that did get better after stopping the medicine.  This does not seem typical of SAMS.  04/22/2018  Joann Ford is seen today in follow-up.  She has recently been seen by Almyra Deforest, PA-C for persistent atrial fibrillation.  In December she was hospitalized with dyspnea and found to have an EF of 40 to 45% on echo with A. fib and RVR.  She underwent TEE guided cardioversion with multiple shocks that was not found to be effective.  She says she remains asymptomatic with her A. fib.  Heart rates in the low today in the 40s and is irregularly irregular.  She is on higher dose diltiazem.  Is not clear if she is symptomatic though with his heart rate.  He has not yet decided whether she wanted to have surgery to correct her significant kyphosis.  Did stop her statin medication and her LDL accordingly has gone up.  I suspect that she had myalgias to atorvastatin.  Her most recent lipid profile showed an LDL cholesterol 123.  Her goal LDL is less than 100 at least or perhaps even less than 70.  11/15/2018  Joann Ford is seen today in follow-up.  Unfortunately she was not able to be operated on with regards to spine straightening after speaking with multiple specialist.  When I last saw her we started on low-dose  Crestor 5 mg every other day.  She thinks that is probably as much as she can tolerate due to side effects.  Blood pressure is actually low today 102/66.  She denies any chest pain or shortness of breath.  She has no positional dizziness.  She denies any recurrent symptomatic palpitations but remains in A. fib today which is rate controlled.  PMHx:  Past Medical History:  Diagnosis Date  . Anxiety   . Arthritis   . Chronic pain   . Hyperlipidemia   . Osteoporosis      Past Surgical History:  Procedure Laterality Date  . CARDIOVERSION N/A 02/08/2018   Procedure: CARDIOVERSION;  Surgeon: Pixie Casino, MD;  Location: [facility] ENDOSCOPY;  Service: Cardiovascular;  Laterality: N/A;  . I and D of boil  1998, 1999   bilateral axilla  . TEE WITHOUT CARDIOVERSION N/A 02/08/2018   Procedure: TRANSESOPHAGEAL ECHOCARDIOGRAM (TEE);  Surgeon: Pixie Casino, MD;  Location: [facility] ENDOSCOPY;  Service: Cardiovascular;  Laterality: N/A;    FAMHx:  Family History  Problem Relation Age of Onset  . Cancer Father        Jaw Cancer  . Pneumonia Mother   . Cancer Cousin        jaw  . Colon cancer Neg Hx   . Stomach cancer Neg Hx     SOCHx:   reports that she has been smoking cigarettes. She started smoking about 53 years ago. She has a 40.00 pack-year smoking history. She has never used smokeless tobacco. She reports that she does not drink alcohol or use drugs.  ALLERGIES:  Allergies  Allergen Reactions  . Cefadroxil Swelling    Throat closes  . Atorvastatin Other (See Comments)    Possible SAMS - 40 mg dose  . Ciprofloxacin     Throat closes  . Penicillins Swelling    throat closes    ROS: Pertinent items noted in HPI and remainder of comprehensive ROS otherwise negative.  HOME MEDS: Current Outpatient Medications on File Prior to Visit  Medication Sig Dispense Refill  . acetaminophen (TYLENOL) 500 MG tablet Take 1,000 mg by mouth every 6 (six) hours as needed for mild pain.    Marland Kitchen albuterol (PROVENTIL) (2.5 MG/3ML) 0.083% nebulizer solution Take 3 mLs (2.5 mg total) by nebulization every 6 (six) hours as needed for wheezing or shortness of breath. 300 mL 5  . apixaban (ELIQUIS) 5 MG TABS tablet Take 1 tablet (5 mg total) by mouth 2 (two) times daily. 60 tablet 5  . diltiazem (CARDIZEM CD) 240 MG 24 hr capsule Take 1 capsule (240 mg total) by mouth daily. 90 capsule 3  . diphenhydramine-acetaminophen (TYLENOL PM) 25-500 MG TABS tablet Take 1 tablet by  mouth at bedtime.    Marland Kitchen escitalopram (LEXAPRO) 20 MG tablet Take 1 tablet (20 mg total) by mouth daily. 30 tablet 11  . furosemide (LASIX) 40 MG tablet Take 1 tablet (40 mg total) by mouth daily. 90 tablet 3  . gabapentin (NEURONTIN) 100 MG capsule Take 300 mg by mouth daily.     Marland Kitchen morphine (MS CONTIN) 15 MG 12 hr tablet     . morphine (MSIR) 15 MG tablet     . rosuvastatin (CRESTOR) 5 MG tablet Take 1 tablet (5 mg total) by mouth every other day. 45 tablet 3   Current Facility-Administered Medications on File Prior to Visit  Medication Dose Route Frequency Provider Last Rate Last Dose  . 0.9 %  sodium chloride infusion  500 mL Intravenous Continuous Lafayette Dragon, MD        LABS/IMAGING: No results found for this or any previous visit (from the past 48 hour(s)). No results found.  LIPID PANEL:    Component Value Date/Time   CHOL 198 06/16/2017 1100   TRIG 120 06/16/2017 1100   HDL 51 06/16/2017 1100   CHOLHDL 3.9 06/16/2017 1100   CHOLHDL 3 09/29/2011 1120   VLDL 18.2 09/29/2011 1120   LDLCALC 123 (H) 06/16/2017 1100   LDLDIRECT 156.9 02/07/2008 1407    WEIGHTS: Wt Readings from Last 3 Encounters:  11/15/18 160 lb (72.6 kg)  04/22/18 163 lb 4.8 oz (74.1 kg)  02/10/18 165 lb 12.8 oz (75.2 kg)    VITALS: BP 102/66   Pulse 87   Ht 5\' 4"  (1.626 m)   Wt 160 lb (72.6 kg)   BMI 27.46 kg/m   EXAM: General appearance: alert and no distress Neck: no carotid bruit, no JVD and thyroid not enlarged, symmetric, no tenderness/mass/nodules Lungs: diminished breath sounds bilaterally Heart: regular rate and rhythm, S1, S2 normal, no murmur, click, rub or gallop Abdomen: soft, non-tender; bowel sounds normal; no masses,  no organomegaly Extremities: extremities normal, atraumatic, no cyanosis or edema and Marked thoracic kyphosis Pulses: 2+ and symmetric Skin: Skin color, texture, turgor normal. No rashes or lesions Neurologic: Grossly normal Psych: Pleasant  EKG: Atrial  fibrillation 87-personally reviewed  ASSESSMENT: 1. Low risk Myoview stress test with a small area of inferolateral reversible ischemia, LVEF 61% (06/2017) 2. LVEF 40 to 45% by echo in the setting of A. fib with RVR 3. Persistent A. fib (failed cardioversion x3, 01/2018) 4. COPD 5. 40-pack-year smoker-ongoing 6. Mild aortic stenosis (06/2017) 7. Dyslipidemia - intolerant to atorvastatin 8. Coronary artery calcification  PLAN: 1.   Joann Ford continues to do well with regards to A. fib for which she is basically asymptomatic.  She is persistent and rate controlled.  She is tolerating anticoagulation.  She did have some mild aortic stenosis which will need to follow-up on by echo and her EF had normalized by Myoview testing.  Unfortunately she is not a candidate for surgery on her spine.  No changes made to her medicines today.  Follow-up with me annually or sooner as necessary.  Pixie Casino, MD, [facility], [place] Director of the Advanced Lipid Disorders &  Cardiovascular Risk Reduction Clinic Diplomate of the American Board of Clinical Lipidology Attending Cardiologist  Direct Dial: [phone]  Fax: [phone]  Website:  [email or web address] 11/15/2018, 1:38 PM

## 2018-11-16 ENCOUNTER — Encounter: Payer: Self-pay | Admitting: Internal Medicine

## 2018-11-16 LAB — LIPID PANEL
Chol/HDL Ratio: 2.8 ratio (ref 0.0–4.4)
Cholesterol, Total: 131 mg/dL (ref 100–199)
HDL: 47 mg/dL (ref >39)
LDL Chol Calc (NIH): 69 mg/dL (ref 0–99)
Triglycerides: 73 mg/dL (ref 0–149)
VLDL Cholesterol Cal: 15 mg/dL (ref 5–40)

## 2018-11-17 ENCOUNTER — Encounter: Payer: Self-pay | Admitting: Internal Medicine

## 2019-01-07 ENCOUNTER — Other Ambulatory Visit: Payer: Self-pay

## 2019-01-07 ENCOUNTER — Encounter: Payer: Self-pay | Admitting: Internal Medicine

## 2019-01-07 ENCOUNTER — Other Ambulatory Visit: Payer: Self-pay | Admitting: Pharmacist

## 2019-01-07 ENCOUNTER — Ambulatory Visit (INDEPENDENT_AMBULATORY_CARE_PROVIDER_SITE_OTHER): Payer: Medicare HMO | Admitting: Internal Medicine

## 2019-01-07 VITALS — BP 137/78 | HR 89 | Temp 97.3°F | Ht 64.0 in | Wt 152.6 lb

## 2019-01-07 DIAGNOSIS — I83892 Varicose veins of left lower extremities with other complications: Secondary | ICD-10-CM | POA: Diagnosis not present

## 2019-01-07 MED ORDER — APIXABAN 5 MG PO TABS: 5.0000 mg | ORAL_TABLET | Freq: Two times a day (BID) | ORAL | 5 refills | Status: AC

## 2019-01-07 NOTE — Patient Instructions (Signed)
Medication Instructions:  INCREASE lasix to 40mg  twice daily for 1 week Continue other current medications *If you need a refill on your cardiac medications before your next appointment, please call your pharmacy*  Lab Work: NONE   Follow-Up: At [facility], you and your health needs are our priority.  As part of our continuing mission to provide you with exceptional heart care, we have created designated Provider Care Teams.  These Care Teams include your primary Cardiologist (physician) and Advanced Practice Providers (APPs -  Physician Assistants and Nurse Practitioners) who all work together to provide you with the care you need, when you need it.  Your next appointment:   1 month   The format for your next appointment:   In Person  Provider:   You may see Pixie Casino, MD or one of the following Advanced Practice Providers on your designated Care Team:    Almyra Deforest, PA-C  Fabian Sharp, PA-C or   Roby Lofts, [place]   Other Instructions  Dr. Debara Pickett recommends compression stockings, wrapping legs (Ace bandage), elevate legs

## 2019-01-07 NOTE — Progress Notes (Signed)
OFFICE CONSULT NOTE  Chief Complaint:  Hospital follow-up  Primary Care Physician: Ardith Dark, PA-C  HPI:  Joann Ford is a 73 y.o. female who is being seen today for the evaluation of shortness of breath and preoperative clearance at the request of Ardith Dark, [place]. This is a 73 year old female with a 40-pack-year smoking history, who continues to smoke, history of dyslipidemia, anxiety, arthritis and osteoporosis with marked thoracic kyphosis.  Recently she was evaluated for possible spine straightening surgery.  Due to insurance reason she canceled any further surgery options and recently was seen for preoperative clearance and management of COPD by Dr. Lamonte Sakai.  He felt that she was acceptable risk for surgery, but she is being seen now for cardiovascular risk assessment.  She had a CT scan of the thoracic and lumbar spine in November 2018.  This showed significant coronary artery calcification and aortic atherosclerosis.  She has history of dyslipidemia but is not currently on a statin medication.  Symptomatically, she gets short of breath with minimal exertion.  She is not able to do a lot of activity due to her back problem.  There is no significant family history of heart disease in her first-degree relatives.  Does report a history of heart murmur.  07/21/2017  Joann Ford returns today for follow-up.  She underwent Lexiscan Myoview as well as echocardiogram for evaluation of murmur and preoperative risk assessment for possible spinal surgery.  Her echocardiogram demonstrated normal LVEF of 55 to 60% however there is very mild stenosis of the aortic valve.  Her Myoview stress test was low risk however there was a very small area of apical inferior and apical lateral ischemia.  Since she has been asymptomatic, I would recommend treating this medically.  Overall she is acceptable risk for back surgery should she choose to go through that.  We did also perform blood work  including a lipid profile.  This demonstrated an LDL of 123.  She was then started on atorvastatin 40 mg daily.  Unfortunately, she says she was intolerant of it after only 2 days on the medicine.  She said she developed severe joint pains that did get better after stopping the medicine.  This does not seem typical of SAMS.  04/22/2018  Joann Ford is seen today in follow-up.  She has recently been seen by Almyra Deforest, PA-C for persistent atrial fibrillation.  In December she was hospitalized with dyspnea and found to have an EF of 40 to 45% on echo with A. fib and RVR.  She underwent TEE guided cardioversion with multiple shocks that was not found to be effective.  She says she remains asymptomatic with her A. fib.  Heart rates in the low today in the 40s and is irregularly irregular.  She is on higher dose diltiazem.  Is not clear if she is symptomatic though with his heart rate.  He has not yet decided whether she wanted to have surgery to correct her significant kyphosis.  Did stop her statin medication and her LDL accordingly has gone up.  I suspect that she had myalgias to atorvastatin.  Her most recent lipid profile showed an LDL cholesterol 123.  Her goal LDL is less than 100 at least or perhaps even less than 70.  11/15/2018  Joann Ford is seen today in follow-up.  Unfortunately she was not able to be operated on with regards to spine straightening after speaking with multiple specialist.  When I last saw her we started on low-dose  Crestor 5 mg every other day.  She thinks that is probably as much as she can tolerate due to side effects.  Blood pressure is actually low today 102/66.  She denies any chest pain or shortness of breath.  She has no positional dizziness.  She denies any recurrent symptomatic palpitations but remains in A. fib today which is rate controlled.  01/07/2019  Joann Ford seen today in follow-up.  I just saw her back in September.  Overall she seemed to be doing pretty well however  over the past month or so she has had some increased lower extremity swelling, particular in the left lower extremity.  Today she is seen as an acute visit for that.  She developed some weeping edema at home.  She has significant bilateral venous insufficiency with varicose veins, hemosiderin deposition and poorly healing wounds.  There is some asymmetric swelling of the left lower extremity.  She is however anticoagulated on Eliquis because of her A. fib and therefore the likelihood of DVT is extremely low.  I suspect that she probably has significant venous reflux and the fact that she sits most of the day with her leg down due to orthopedic problems with her spine, I think that is contributing to her swelling.    PMHx:  Past Medical History:  Diagnosis Date   Anxiety    Arthritis    Chronic pain    Hyperlipidemia    Osteoporosis     Past Surgical History:  Procedure Laterality Date   CARDIOVERSION N/A 02/08/2018   Procedure: CARDIOVERSION;  Surgeon: Pixie Casino, MD;  Location: [facility] ENDOSCOPY;  Service: Cardiovascular;  Laterality: N/A;   I and D of boil  1998, 1999   bilateral axilla   TEE WITHOUT CARDIOVERSION N/A 02/08/2018   Procedure: TRANSESOPHAGEAL ECHOCARDIOGRAM (TEE);  Surgeon: Pixie Casino, MD;  Location: [facility] ENDOSCOPY;  Service: Cardiovascular;  Laterality: N/A;    FAMHx:  Family History  Problem Relation Age of Onset   Cancer Father        Jaw Cancer   Pneumonia Mother    Cancer Cousin        jaw   Colon cancer Neg Hx    Stomach cancer Neg Hx     SOCHx:   reports that she has been smoking cigarettes. She started smoking about 53 years ago. She has a 40.00 pack-year smoking history. She has never used smokeless tobacco. She reports that she does not drink alcohol or use drugs.  ALLERGIES:  Allergies  Allergen Reactions   Cefadroxil Swelling    Throat closes   Atorvastatin Other (See Comments)    Possible SAMS - 40 mg dose   Ciprofloxacin       Throat closes   Penicillins Swelling    throat closes    ROS: Pertinent items noted in HPI and remainder of comprehensive ROS otherwise negative.  HOME MEDS: Current Outpatient Medications on File Prior to Visit  Medication Sig Dispense Refill   acetaminophen (TYLENOL) 500 MG tablet Take 1,000 mg by mouth every 6 (six) hours as needed for mild pain.     albuterol (PROVENTIL) (2.5 MG/3ML) 0.083% nebulizer solution Take 3 mLs (2.5 mg total) by nebulization every 6 (six) hours as needed for wheezing or shortness of breath. 300 mL 5   apixaban (ELIQUIS) 5 MG TABS tablet Take 1 tablet (5 mg total) by mouth 2 (two) times daily. 60 tablet 5   diltiazem (CARDIZEM CD) 240 MG 24 hr capsule Take 1  capsule (240 mg total) by mouth daily. 90 capsule 3   diphenhydramine-acetaminophen (TYLENOL PM) 25-500 MG TABS tablet Take 1 tablet by mouth at bedtime.     escitalopram (LEXAPRO) 20 MG tablet Take 1 tablet (20 mg total) by mouth daily. 30 tablet 11   furosemide (LASIX) 40 MG tablet Take 1 tablet (40 mg total) by mouth daily. 90 tablet 3   gabapentin (NEURONTIN) 100 MG capsule Take 300 mg by mouth daily.      morphine (MS CONTIN) 15 MG 12 hr tablet      morphine (MSIR) 15 MG tablet      rosuvastatin (CRESTOR) 5 MG tablet Take 1 tablet (5 mg total) by mouth every other day. 45 tablet 3   Current Facility-Administered Medications on File Prior to Visit  Medication Dose Route Frequency Provider Last Rate Last Dose   0.9 %  sodium chloride infusion  500 mL Intravenous Continuous Lafayette Dragon, MD        LABS/IMAGING: No results found for this or any previous visit (from the past 48 hour(s)). No results found.  LIPID PANEL:    Component Value Date/Time   CHOL 131 11/16/2018 0944   TRIG 73 11/16/2018 0944   HDL 47 11/16/2018 0944   CHOLHDL 2.8 11/16/2018 0944   CHOLHDL 3 09/29/2011 1120   VLDL 18.2 09/29/2011 1120   LDLCALC 69 11/16/2018 0944   LDLDIRECT 156.9 02/07/2008 1407     WEIGHTS: Wt Readings from Last 3 Encounters:  01/07/19 152 lb 9.6 oz (69.2 kg)  11/15/18 160 lb (72.6 kg)  04/22/18 163 lb 4.8 oz (74.1 kg)    VITALS: BP 137/78    Pulse 89    Temp (!) 97.3 F (36.3 C)    Ht 5\' 4"  (1.626 m)    Wt 152 lb 9.6 oz (69.2 kg)    SpO2 (!) 89%    BMI 26.19 kg/m   EXAM: Extremities: edema 1-2+ left lower extremity and Venous stasis changes with superficial varicose veins, hemosiderin deposition  EKG: Deferred  ASSESSMENT: 1. Acute on chronic LLE weeping edema  PLAN: 1.   Joann Ford is seen today for acute on chronic left lower extremity edema, likely related to venous insufficiency.  She has been compliant with Eliquis and the likelihood of DVT is very low.  I suspect this is related to superficial varicose veins.  I recommend we increase her Lasix to 40 mg twice daily for least a week and wrap the left lower extremity and elevate it is much as possible.  She will need close follow-up likely in 2 to 4 weeks in the office.  Pixie Casino, MD, [facility], [place] Director of the Advanced Lipid Disorders &  Cardiovascular Risk Reduction Clinic Diplomate of the American Board of Clinical Lipidology Attending Cardiologist  Direct Dial: [phone]   Fax: [phone]  Website:  [email or web address] Joann Ford 01/07/2019, 12:12 PM

## 2019-01-21 ENCOUNTER — Other Ambulatory Visit: Payer: Self-pay

## 2019-01-21 ENCOUNTER — Inpatient Hospital Stay (HOSPITAL_COMMUNITY)
Admission: EM | Admit: 2019-01-21 | Discharge: 2019-01-23 | DRG: 183 | Disposition: A | Discharge status: Home Health | Payer: Medicare HMO | Attending: Internal Medicine | Admitting: Internal Medicine

## 2019-01-21 ENCOUNTER — Emergency Department (HOSPITAL_COMMUNITY): Payer: Medicare HMO

## 2019-01-21 ENCOUNTER — Encounter (HOSPITAL_COMMUNITY): Payer: Self-pay

## 2019-01-21 DIAGNOSIS — G8929 Other chronic pain: Secondary | ICD-10-CM | POA: Diagnosis present

## 2019-01-21 DIAGNOSIS — J439 Emphysema, unspecified: Secondary | ICD-10-CM | POA: Diagnosis present

## 2019-01-21 DIAGNOSIS — J986 Disorders of diaphragm: Secondary | ICD-10-CM | POA: Diagnosis present

## 2019-01-21 DIAGNOSIS — Y92003 Bedroom of unspecified non-institutional (private) residence as the place of occurrence of the external cause: Secondary | ICD-10-CM

## 2019-01-21 DIAGNOSIS — Z7901 Long term (current) use of anticoagulants: Secondary | ICD-10-CM | POA: Diagnosis not present

## 2019-01-21 DIAGNOSIS — J9611 Chronic respiratory failure with hypoxia: Secondary | ICD-10-CM | POA: Diagnosis present

## 2019-01-21 DIAGNOSIS — Z880 Allergy status to penicillin: Secondary | ICD-10-CM

## 2019-01-21 DIAGNOSIS — F329 Major depressive disorder, single episode, unspecified: Secondary | ICD-10-CM | POA: Diagnosis present

## 2019-01-21 DIAGNOSIS — W01190A Fall on same level from slipping, tripping and stumbling with subsequent striking against furniture, initial encounter: Secondary | ICD-10-CM | POA: Diagnosis present

## 2019-01-21 DIAGNOSIS — S2242XA Multiple fractures of ribs, left side, initial encounter for closed fracture: Principal | ICD-10-CM

## 2019-01-21 DIAGNOSIS — Z79899 Other long term (current) drug therapy: Secondary | ICD-10-CM

## 2019-01-21 DIAGNOSIS — Z881 Allergy status to other antibiotic agents status: Secondary | ICD-10-CM

## 2019-01-21 DIAGNOSIS — Z9181 History of falling: Secondary | ICD-10-CM

## 2019-01-21 DIAGNOSIS — Z20828 Contact with and (suspected) exposure to other viral communicable diseases: Secondary | ICD-10-CM | POA: Diagnosis present

## 2019-01-21 DIAGNOSIS — Z888 Allergy status to other drugs, medicaments and biological substances status: Secondary | ICD-10-CM | POA: Diagnosis not present

## 2019-01-21 DIAGNOSIS — E785 Hyperlipidemia, unspecified: Secondary | ICD-10-CM | POA: Diagnosis present

## 2019-01-21 DIAGNOSIS — Z66 Do not resuscitate: Secondary | ICD-10-CM | POA: Diagnosis present

## 2019-01-21 DIAGNOSIS — I350 Nonrheumatic aortic (valve) stenosis: Secondary | ICD-10-CM | POA: Diagnosis not present

## 2019-01-21 DIAGNOSIS — W19XXXD Unspecified fall, subsequent encounter: Secondary | ICD-10-CM | POA: Diagnosis present

## 2019-01-21 DIAGNOSIS — F17210 Nicotine dependence, cigarettes, uncomplicated: Secondary | ICD-10-CM | POA: Diagnosis present

## 2019-01-21 DIAGNOSIS — I4811 Longstanding persistent atrial fibrillation: Secondary | ICD-10-CM | POA: Diagnosis present

## 2019-01-21 DIAGNOSIS — M419 Scoliosis, unspecified: Secondary | ICD-10-CM | POA: Diagnosis present

## 2019-01-21 DIAGNOSIS — R011 Cardiac murmur, unspecified: Secondary | ICD-10-CM | POA: Diagnosis not present

## 2019-01-21 DIAGNOSIS — I5020 Unspecified systolic (congestive) heart failure: Secondary | ICD-10-CM | POA: Diagnosis present

## 2019-01-21 DIAGNOSIS — S81811D Laceration without foreign body, right lower leg, subsequent encounter: Secondary | ICD-10-CM | POA: Diagnosis not present

## 2019-01-21 DIAGNOSIS — L89021 Pressure ulcer of left elbow, stage 1: Secondary | ICD-10-CM | POA: Diagnosis present

## 2019-01-21 DIAGNOSIS — L8990 Pressure ulcer of unspecified site, unspecified stage: Secondary | ICD-10-CM | POA: Insufficient documentation

## 2019-01-21 DIAGNOSIS — I4891 Unspecified atrial fibrillation: Secondary | ICD-10-CM | POA: Diagnosis not present

## 2019-01-21 DIAGNOSIS — J9601 Acute respiratory failure with hypoxia: Secondary | ICD-10-CM | POA: Diagnosis present

## 2019-01-21 DIAGNOSIS — Z79891 Long term (current) use of opiate analgesic: Secondary | ICD-10-CM

## 2019-01-21 DIAGNOSIS — W01198A Fall on same level from slipping, tripping and stumbling with subsequent striking against other object, initial encounter: Secondary | ICD-10-CM | POA: Diagnosis not present

## 2019-01-21 DIAGNOSIS — I5089 Other heart failure: Secondary | ICD-10-CM | POA: Diagnosis not present

## 2019-01-21 DIAGNOSIS — M810 Age-related osteoporosis without current pathological fracture: Secondary | ICD-10-CM | POA: Diagnosis present

## 2019-01-21 DIAGNOSIS — Y9301 Activity, walking, marching and hiking: Secondary | ICD-10-CM | POA: Diagnosis present

## 2019-01-21 DIAGNOSIS — J449 Chronic obstructive pulmonary disease, unspecified: Secondary | ICD-10-CM

## 2019-01-21 DIAGNOSIS — I110 Hypertensive heart disease with heart failure: Secondary | ICD-10-CM | POA: Diagnosis present

## 2019-01-21 DIAGNOSIS — R0902 Hypoxemia: Secondary | ICD-10-CM

## 2019-01-21 LAB — CBC WITH DIFFERENTIAL/PLATELET
Abs Immature Granulocytes: 0.06 10*3/uL (ref 0.00–0.07)
Basophils Absolute: 0.1 10*3/uL (ref 0.0–0.1)
Basophils Relative: 1 %
Eosinophils Absolute: 0.1 10*3/uL (ref 0.0–0.5)
Eosinophils Relative: 1 %
HCT: 49.4 % — ABNORMAL HIGH (ref 36.0–46.0)
Hemoglobin: 15.8 g/dL — ABNORMAL HIGH (ref 12.0–15.0)
Immature Granulocytes: 1 %
Lymphocytes Relative: 11 %
Lymphs Abs: 1.1 10*3/uL (ref 0.7–4.0)
MCH: 31.2 pg (ref 26.0–34.0)
MCHC: 32 g/dL (ref 30.0–36.0)
MCV: 97.4 fL (ref 80.0–100.0)
Monocytes Absolute: 1 10*3/uL (ref 0.1–1.0)
Monocytes Relative: 9 %
Neutro Abs: 7.9 10*3/uL — ABNORMAL HIGH (ref 1.7–7.7)
Neutrophils Relative %: 77 %
Platelets: 178 10*3/uL (ref 150–400)
RBC: 5.07 MIL/uL (ref 3.87–5.11)
RDW: 13.5 % (ref 11.5–15.5)
WBC: 10.2 10*3/uL (ref 4.0–10.5)
nRBC: 0 % (ref 0.0–0.2)

## 2019-01-21 LAB — BASIC METABOLIC PANEL
Anion gap: 11 (ref 5–15)
BUN: 10 mg/dL (ref 8–23)
CO2: 31 mmol/L (ref 22–32)
Calcium: 8.8 mg/dL — ABNORMAL LOW (ref 8.9–10.3)
Chloride: 100 mmol/L (ref 98–111)
Creatinine, Ser: 0.88 mg/dL (ref 0.44–1.00)
GFR calc Af Amer: >60 mL/min (ref >60)
GFR calc non Af Amer: >60 mL/min (ref >60)
Glucose, Bld: 117 mg/dL — ABNORMAL HIGH (ref 70–99)
Potassium: 3.7 mmol/L (ref 3.5–5.1)
Sodium: 142 mmol/L (ref 135–145)

## 2019-01-21 LAB — SARS CORONAVIRUS 2 (TAT 6-24 HRS): SARS Coronavirus 2: NEGATIVE

## 2019-01-21 MED ORDER — ALBUTEROL SULFATE (2.5 MG/3ML) 0.083% IN NEBU: 3.0000 mL | INHALATION_SOLUTION | Freq: Four times a day (QID) | RESPIRATORY_TRACT | Status: DC | PRN

## 2019-01-21 MED ORDER — FENTANYL CITRATE (PF) 100 MCG/2ML IJ SOLN
50.0000 ug | Freq: Once | INTRAMUSCULAR | Status: AC
  Administered 2019-01-21: 50 ug via INTRAVENOUS
  Filled 2019-01-21: qty 2

## 2019-01-21 MED ORDER — NICOTINE 21 MG/24HR TD PT24
21.0000 mg | MEDICATED_PATCH | Freq: Every day | TRANSDERMAL | Status: DC
  Administered 2019-01-21 – 2019-01-23 (×3): 21 mg via TRANSDERMAL
  Filled 2019-01-21 (×3): qty 1

## 2019-01-21 MED ORDER — ENOXAPARIN SODIUM 40 MG/0.4ML ~~LOC~~ SOLN
40.0000 mg | SUBCUTANEOUS | Status: AC
  Administered 2019-01-21 – 2019-01-22 (×2): 40 mg via SUBCUTANEOUS
  Filled 2019-01-21 (×2): qty 0.4

## 2019-01-21 MED ORDER — IOHEXOL 300 MG/ML  SOLN
100.0000 mL | Freq: Once | INTRAMUSCULAR | Status: AC | PRN
  Administered 2019-01-21: 100 mL via INTRAVENOUS

## 2019-01-21 MED ORDER — MORPHINE SULFATE ER 15 MG PO TBCR
15.0000 mg | EXTENDED_RELEASE_TABLET | Freq: Two times a day (BID) | ORAL | Status: DC
  Administered 2019-01-21 – 2019-01-23 (×4): 15 mg via ORAL
  Filled 2019-01-21 (×5): qty 1

## 2019-01-21 MED ORDER — ROSUVASTATIN CALCIUM 5 MG PO TABS
5.0000 mg | ORAL_TABLET | ORAL | Status: DC
  Administered 2019-01-22: 5 mg via ORAL
  Filled 2019-01-21: qty 1

## 2019-01-21 MED ORDER — METOPROLOL TARTRATE 12.5 MG HALF TABLET: 12.5000 mg | ORAL_TABLET | Freq: Two times a day (BID) | ORAL | Status: DC

## 2019-01-21 MED ORDER — DILTIAZEM HCL ER COATED BEADS 240 MG PO CP24
240.0000 mg | ORAL_CAPSULE | Freq: Every day | ORAL | Status: DC
  Administered 2019-01-21 – 2019-01-23 (×3): 240 mg via ORAL
  Filled 2019-01-21 (×3): qty 1

## 2019-01-21 MED ORDER — GABAPENTIN 300 MG PO CAPS
300.0000 mg | ORAL_CAPSULE | Freq: Every morning | ORAL | Status: DC
  Administered 2019-01-21 – 2019-01-23 (×3): 300 mg via ORAL
  Filled 2019-01-21: qty 1
  Filled 2019-01-21: qty 3
  Filled 2019-01-21: qty 1

## 2019-01-21 MED ORDER — ESCITALOPRAM OXALATE 20 MG PO TABS
20.0000 mg | ORAL_TABLET | Freq: Every day | ORAL | Status: DC
  Administered 2019-01-21 – 2019-01-23 (×3): 20 mg via ORAL
  Filled 2019-01-21 (×2): qty 1
  Filled 2019-01-21: qty 2

## 2019-01-21 MED ORDER — METOPROLOL TARTRATE 12.5 MG HALF TABLET
12.5000 mg | ORAL_TABLET | Freq: Two times a day (BID) | ORAL | Status: DC
  Administered 2019-01-21 – 2019-01-23 (×4): 12.5 mg via ORAL
  Filled 2019-01-21 (×4): qty 1

## 2019-01-21 MED ORDER — IPRATROPIUM-ALBUTEROL 0.5-2.5 (3) MG/3ML IN SOLN
3.0000 mL | Freq: Four times a day (QID) | RESPIRATORY_TRACT | Status: DC
  Administered 2019-01-21: 3 mL via RESPIRATORY_TRACT
  Filled 2019-01-21: qty 3

## 2019-01-21 MED ORDER — ONDANSETRON HCL 4 MG PO TABS: 4.0000 mg | ORAL_TABLET | Freq: Four times a day (QID) | ORAL | Status: DC | PRN

## 2019-01-21 MED ORDER — FUROSEMIDE 10 MG/ML IJ SOLN
20.0000 mg | Freq: Every day | INTRAMUSCULAR | Status: DC
  Administered 2019-01-21 – 2019-01-23 (×3): 20 mg via INTRAVENOUS
  Filled 2019-01-21 (×3): qty 2

## 2019-01-21 MED ORDER — GABAPENTIN 300 MG PO CAPS
600.0000 mg | ORAL_CAPSULE | Freq: Every day | ORAL | Status: DC
  Administered 2019-01-21 – 2019-01-22 (×2): 600 mg via ORAL
  Filled 2019-01-21 (×2): qty 2

## 2019-01-21 MED ORDER — GABAPENTIN 300 MG PO CAPS: 300.0000 mg | ORAL_CAPSULE | ORAL | Status: DC

## 2019-01-21 MED ORDER — ONDANSETRON HCL 4 MG/2ML IJ SOLN: 4.0000 mg | Freq: Four times a day (QID) | INTRAMUSCULAR | Status: DC | PRN

## 2019-01-21 MED ORDER — POLYETHYLENE GLYCOL 3350 17 G PO PACK
17.0000 g | PACK | Freq: Every day | ORAL | Status: DC
  Administered 2019-01-21 – 2019-01-23 (×3): 17 g via ORAL
  Filled 2019-01-21 (×3): qty 1

## 2019-01-21 MED ORDER — KETOROLAC TROMETHAMINE 15 MG/ML IJ SOLN
15.0000 mg | Freq: Four times a day (QID) | INTRAMUSCULAR | Status: DC | PRN
  Administered 2019-01-21 – 2019-01-22 (×2): 15 mg via INTRAVENOUS
  Filled 2019-01-21 (×2): qty 1

## 2019-01-21 NOTE — ED Notes (Signed)
CT made aware patient is ready for transport as soon as they have scanner available.

## 2019-01-21 NOTE — ED Notes (Signed)
2 liters O2 via nasal cannula applied

## 2019-01-21 NOTE — ED Notes (Addendum)
CT tech came to transport patient to CT, patient stated she could not go for xray until she received something else for pain, this RN made PA [facility] aware of patient's request.

## 2019-01-21 NOTE — ED Provider Notes (Signed)
[facility] EMERGENCY DEPARTMENT Provider Note   CSN: [id number] Arrival date & time: 01/21/19  0501     History   Chief Complaint Chief Complaint  Patient presents with  . Fall    HPI Joann Ford is a 73 y.o. female.     Patient presents to the emergency department with a chief complaint of left-sided rib pain.  She states that she was getting up out of bed this morning and stumbled striking her left ribs on the bedpost.  She complains of severe left-sided rib pain.  She denies shortness of breath, but does complain of pain with breathing.  She denies any head injury.  She is anticoagulated on Eliquis for A. fib.  She denies any other associated symptoms.  Symptoms are worsened with movement and palpation.  The history is provided by the patient. No language interpreter was used.    Past Medical History:  Diagnosis Date  . Anxiety   . Arthritis   . Chronic pain   . Hyperlipidemia   . Osteoporosis     Patient Active Problem List   Diagnosis Date Noted  . Atrial fibrillation with RVR ([place]) 02/05/2018  . Longstanding persistent atrial fibrillation ([place]) 02/05/2018  . Major depressive disorder with single episode, in partial remission ([place]) 10/28/2017  . Dyslipidemia 07/22/2017  . Aortic valve stenosis 07/22/2017  . Pre-operative cardiovascular examination 06/08/2017  . Murmur 06/08/2017  . Other emphysema ([place]) 07/09/2016  . Right hip pain 09/14/2013  . Chronic pain 08/20/2013  . Depression 08/20/2013  . Neuropathy 08/20/2013  . HYPOKALEMIA 12/18/2008  . DYSPEPSIA 12/18/2008  . Postmenopausal status 12/18/2008  . SINUSITIS - ACUTE-NOS 11/21/2008  . Bronchitis with bronchospasm 11/21/2008  . CERUMEN IMPACTION, BILATERAL 09/19/2008  . ELEVATED BP READING WITHOUT DX HYPERTENSION 03/06/2008  . HYPERTENSION 02/07/2008  . EDEMA 10/14/2007  . DOE (dyspnea on exertion) 10/14/2007  . Spinal stenosis of lumbar region 09/28/2007  . Personal history  of tobacco use, presenting hazards to health 11/23/2006  . B12 deficiency 08/17/2006  . Hyperlipidemia 08/17/2006  . Anxiety state 08/17/2006  . Other idiopathic scoliosis, thoracolumbar region 08/17/2006  . OBESITY NOS 04/01/2006  . PANIC ATTACK 04/01/2006    Past Surgical History:  Procedure Laterality Date  . CARDIOVERSION N/A 02/08/2018   Procedure: CARDIOVERSION;  Surgeon: Pixie Casino, MD;  Location: [facility] ENDOSCOPY;  Service: Cardiovascular;  Laterality: N/A;  . I and D of boil  1998, 1999   bilateral axilla  . TEE WITHOUT CARDIOVERSION N/A 02/08/2018   Procedure: TRANSESOPHAGEAL ECHOCARDIOGRAM (TEE);  Surgeon: Pixie Casino, MD;  Location: [facility] ENDOSCOPY;  Service: Cardiovascular;  Laterality: N/A;     OB History   No obstetric history on file.      Home Medications    Prior to Admission medications   Medication Sig Start Date End Date Taking? Authorizing Provider  acetaminophen (TYLENOL) 500 MG tablet Take 1,000 mg by mouth every 6 (six) hours as needed for mild pain.    [provider]  albuterol (PROVENTIL) (2.5 MG/3ML) 0.083% nebulizer solution Take 3 mLs (2.5 mg total) by nebulization every 6 (six) hours as needed for wheezing or shortness of breath. 05/18/17   Collene Gobble, MD  apixaban (ELIQUIS) 5 MG TABS tablet Take 1 tablet (5 mg total) by mouth 2 (two) times daily. 01/07/19   Hilty, Nadean Corwin, MD  diltiazem (CARDIZEM CD) 240 MG 24 hr capsule Take 1 capsule (240 mg total) by mouth daily. 04/30/18  Pixie Casino, MD  diphenhydramine-acetaminophen (TYLENOL PM) 25-500 MG TABS tablet Take 1 tablet by mouth at bedtime.    [provider]  escitalopram (LEXAPRO) 20 MG tablet Take 1 tablet (20 mg total) by mouth daily. 09/29/11   Ann Held, DO  furosemide (LASIX) 40 MG tablet Take 1 tablet (40 mg total) by mouth daily. 04/30/18   Hilty, Nadean Corwin, MD  gabapentin (NEURONTIN) 100 MG capsule Take 300 mg by mouth daily.     [provider]  morphine (MS CONTIN) 15 MG 12 hr tablet  04/16/18   [provider]  morphine (MSIR) 15 MG tablet  04/16/18   [provider]  rosuvastatin (CRESTOR) 5 MG tablet Take 1 tablet (5 mg total) by mouth every other day. 04/30/18 04/25/19  Pixie Casino, MD    Family History Family History  Problem Relation Age of Onset  . Cancer Father        Jaw Cancer  . Pneumonia Mother   . Cancer Cousin        jaw  . Colon cancer Neg Hx   . Stomach cancer Neg Hx     Social History Social History   Tobacco Use  . Smoking status: Current Every Day Smoker    Packs/day: 1.00    Years: 40.00    Pack years: 40.00    Types: Cigarettes    Start date: 03/15/1965  . Smokeless tobacco: Never Used  Substance Use Topics  . Alcohol use: No  . Drug use: No     Allergies   Cefadroxil, Atorvastatin, Ciprofloxacin, and Penicillins   Review of Systems Review of Systems  All other systems reviewed and are negative.    Physical Exam Updated Vital Signs Ht 5\' 4"  (1.626 m)   Wt 69.2 kg   BMI 26.19 kg/m   Physical Exam Vitals signs and nursing note reviewed.  Constitutional:      General: She is not in acute distress.    Appearance: She is well-developed.  HENT:     Head: Normocephalic and atraumatic.  Eyes:     Conjunctiva/sclera: Conjunctivae normal.  Neck:     Musculoskeletal: Neck supple.  Cardiovascular:     Rate and Rhythm: Normal rate and regular rhythm.     Heart sounds: No murmur.  Pulmonary:     Effort: Pulmonary effort is normal. No respiratory distress.     Breath sounds: Normal breath sounds.     Comments: Left inferior chest wall ttp Chest:     Chest wall: Tenderness present.  Abdominal:     Palpations: Abdomen is soft.     Tenderness: There is no abdominal tenderness.  Musculoskeletal: Normal range of motion.  Skin:    General: Skin is warm and dry.  Neurological:     Mental Status: She is alert and oriented to person, place, and  time.  Psychiatric:        Mood and Affect: Mood normal.        Behavior: Behavior normal.      ED Treatments / Results  Labs (all labs ordered are listed, but only abnormal results are displayed) Labs Reviewed - No data to display  EKG None  Radiology Dg Chest Portable 1 View  Result Date: 01/21/2019 CLINICAL DATA:  Fall EXAM: PORTABLE CHEST 1 VIEW COMPARISON:  08/03/2018 FINDINGS: Cardiomegaly and aortic tortuosity. Chronic low volume chest with asymmetric elevation of the right diaphragm where there is colonic interposition. There is overlying scarring.  Stable appearance of lung markings. There is no edema, consolidation, effusion, or pneumothorax. No appreciable rib fracture. There is prominent osteopenia. Scoliosis. IMPRESSION: Stable low volume chest with cardiomegaly and asymmetric right diaphragm elevation. Electronically Signed   By: Monte Fantasia M.D.   On: 01/21/2019 05:54    Procedures Procedures (including critical care time)  Medications Ordered in ED Medications  fentaNYL (SUBLIMAZE) injection 50 mcg (has no administration in time range)     Initial Impression / Assessment and Plan / ED Course  I have reviewed the triage vital signs and the nursing notes.  Pertinent labs & imaging results that were available during my care of the patient were reviewed by me and considered in my medical decision making (see chart for details).        Patient with fall from standing.  Hit left chest wall on bed post.  Complaining of left sided chest wall pain.  CXR shows no pnuemothorax or apparent rib fracture.    6:30 AM Poor capturing or pulse ox, nursing working on this now.  Will plan to send patient for CT of chest to rule out any trauma not seen on CXR.  Pain relatively well controlled.  Signed out to [place], [place].  Final Clinical Impressions(s) / ED Diagnoses   Final diagnoses:  None    ED Discharge Orders    None       Montine Circle, PA-C  01/21/19 0631    Veryl Speak, MD 01/21/19 [phone]

## 2019-01-21 NOTE — Consult Note (Signed)
Joann Ford [birth year]  [id number].    Requesting MD: Caryl Ada, PA-C Chief Complaint/Reason for Consult: Rib Fractures  HPI: Joann Ford is a 73 y.o. female with a history of atrial fibrillation on Eliquis, HLD, HTN, COPD, and tobacco abuse (40+ year hx) who presented to [facility] this morning around 5 AM after a ground-level fall.  Patient's husband is at bedside and helps for provide history.  Patient was apparently getting out of bed and was walking to the bathroom when her foot hit the foot of the bed, causing her to fall onto her left side.  No head trauma or loss of consciousness.  She was ambulatory after the event.  She called EMS who brought her to [facility] ED.  There she was found to have left fifth through eighth rib fractures without hemothorax or pneumothorax.  No other injuries.  Patient's O2 sats dropped into the upper 80s when taken to room air.  She is currently requiring 2 L of O2.  Trauma was asked to see. She only complains of left chest wall pain currently and some mild sob.   Husband reports patient also had another ground-level fall 1 week ago.  There she suffered a skin tear/laceration of her right lower extremity.  She does have some mild erythema around that area.  Patient was seen at her cardiologist on 11/6.  It appears she was hypoxic at 89% in their office during the visit and was started on Lasix 40 mg BID for her peripheral edema.  I suspect she has some underlying pulmonary issues and rib fractures are contributing to her not wanting to take a deep breath.  Patient does not wear oxygen at home.  She reports that she normally can walk 100 feet using her walker before becoming short of breath.  Her husband primarily does all the grocery shopping, etc. so she does not ambulate much at home.  Patient is retired.  She denies any alcohol or illicit drug use.  ROS: Review of Systems  Constitutional: Negative for chills and fever.  Eyes: Negative for blurred vision  and double vision.  Respiratory: Positive for shortness of breath. Negative for cough.   Cardiovascular: Positive for chest pain. Negative for leg swelling.  Gastrointestinal: Negative for abdominal pain, constipation, nausea and vomiting.  Genitourinary: Negative for dysuria.  Musculoskeletal: Positive for falls. Negative for back pain, joint pain and neck pain.  Neurological: Negative for tingling, speech change and focal weakness.  All other systems reviewed and are negative.   Family History  Problem Relation Age of Onset  . Cancer Father        Jaw Cancer  . Pneumonia Mother   . Cancer Cousin        jaw  . Colon cancer Neg Hx   . Stomach cancer Neg Hx     Past Medical History:  Diagnosis Date  . Anxiety   . Arthritis   . Chronic pain   . Hyperlipidemia   . Osteoporosis     Past Surgical History:  Procedure Laterality Date  . CARDIOVERSION N/A 02/08/2018   Procedure: CARDIOVERSION;  Surgeon: Pixie Casino, MD;  Location[facility] ENDOSCOPY;  Service: Cardiovascular;  Laterality: N/A;  . I and D of boil  1998, 1999   bilateral axilla  . TEE WITHOUT CARDIOVERSION N/A 02/08/2018   Procedure: TRANSESOPHAGEAL ECHOCARDIOGRAM (TEE);  Surgeon: Pixie Casino, MD;  Location: [facility];  Service: Cardiovascular;  Laterality: N/A;    Social  History:  reports that she has been smoking cigarettes. She started smoking about 53 years ago. She has a 40.00 pack-year smoking history. She has never used smokeless tobacco. She reports that she does not drink alcohol or use drugs.  Allergies:  Allergies  Allergen Reactions  . Cefadroxil Anaphylaxis, Swelling and Other (See Comments)    Throat closes  . Ciprofloxacin Anaphylaxis and Other (See Comments)    Throat closes  . Penicillins Anaphylaxis and Swelling    Did it involve swelling of the face/tongue/throat, SOB, or low BP? Yes Did it involve sudden or severe rash/hives, skin peeling, or any reaction on the inside of your mouth  or nose? No Did you need to seek medical attention at a hospital or doctor's office? Yes When did it last happen?"10-15 years ago" If all above answers are "NO", may proceed with cephalosporin use.   . Atorvastatin Other (See Comments)    Possible SAMS - 40 mg dose    (Not in a hospital admission)    Physical Exam: Blood pressure 123/82, pulse (!) 142, resp. rate (!) 24, height 5\' 4"  (1.626 m), weight 69.2 kg, SpO2 90 %. Physical Exam  Constitutional: She is oriented to person, place, and time and well-developed, well-nourished, and in no distress.  HENT:  Head: Normocephalic and atraumatic. Head is without raccoon's eyes, without Battle's sign and without abrasion.  Right Ear: External ear normal.  Left Ear: External ear normal.  Nose: Nose normal. No nasal septal hematoma.  Mouth/Throat: Uvula is midline, oropharynx is clear and moist and mucous membranes are normal.  Poor dentetion. No palpable open or depressed skull fx  Eyes: Pupils are equal, round, and reactive to light. Conjunctivae and EOM are normal.  Neck: Trachea normal, normal range of motion and full passive range of motion without pain. Neck supple. No spinous process tenderness present.  Cardiovascular: An irregularly irregular rhythm present. Tachycardia present.  Pulses:      Radial pulses are 2+ on the right side and 2+ on the left side.       Dorsalis pedis pulses are 1+ on the right side and 1+ on the left side.  Chronic venous stasis changes to the LE's. Trace edema b/l  Pulmonary/Chest: Effort normal. No tachypnea. She has no decreased breath sounds. She has wheezes (expiratory wheezing throughout). She has no rhonchi. She has no rales. She exhibits tenderness (left). She exhibits no crepitus, no edema, no deformity and no swelling.  Abdominal: Soft. Normal appearance and bowel sounds are normal. She exhibits no distension. There is no abdominal tenderness. There is no rigidity, no rebound and no guarding.   Musculoskeletal:     Comments: Moves all extremities without pain. No TTP over major joints.   Neurological: She is alert and oriented to person, place, and time. She has intact cranial nerves. GCS score is 15.  Equal strength for UE and LE's b/l  Skin: Skin is warm and dry. Laceration (old, healing, right LE) noted. There is erythema.  Psychiatric: Mood, memory, affect and judgment normal.       Results for orders placed or performed during the hospital encounter of 01/21/19 (from the past 48 hour(s))  CBC with Differential     Status: Abnormal   Collection Time: 01/21/19  5:20 AM  Result Value Ref Range   WBC 10.2 4.0 - 10.5 K/uL   RBC 5.07 3.87 - 5.11 MIL/uL   Hemoglobin 15.8 (H) 12.0 - 15.0 g/dL   HCT 49.4 (H) 36.0 -  46.0 %   MCV 97.4 80.0 - 100.0 fL   MCH 31.2 26.0 - 34.0 pg   MCHC 32.0 30.0 - 36.0 g/dL   RDW 13.5 11.5 - 15.5 %   Platelets 178 150 - 400 K/uL   nRBC 0.0 0.0 - 0.2 %   Neutrophils Relative % 77 %   Neutro Abs 7.9 (H) 1.7 - 7.7 K/uL   Lymphocytes Relative 11 %   Lymphs Abs 1.1 0.7 - 4.0 K/uL   Monocytes Relative 9 %   Monocytes Absolute 1.0 0.1 - 1.0 K/uL   Eosinophils Relative 1 %   Eosinophils Absolute 0.1 0.0 - 0.5 K/uL   Basophils Relative 1 %   Basophils Absolute 0.1 0.0 - 0.1 K/uL   Immature Granulocytes 1 %   Abs Immature Granulocytes 0.06 0.00 - 0.07 K/uL    Comment: Performed at [facility] [address] [place], [place] [id number]  Basic metabolic panel     Status: Abnormal   Collection Time: 01/21/19  5:20 AM  Result Value Ref Range   Sodium 142 135 - 145 mmol/L   Potassium 3.7 3.5 - 5.1 mmol/L   Chloride 100 98 - 111 mmol/L   CO2 31 22 - 32 mmol/L   Glucose, Bld 117 (H) 70 - 99 mg/dL   BUN 10 8 - 23 mg/dL   Creatinine, Ser 0.88 0.44 - 1.00 mg/dL   Calcium 8.8 (L) 8.9 - 10.3 mg/dL   GFR calc non Af Amer >60 >60 mL/min   GFR calc Af Amer >60 >60 mL/min   Anion gap 11 5 - 15    Comment: Performed at [facility] [address] [place], [place] [address]   Ct Chest W Contrast  Result Date: 01/21/2019 CLINICAL DATA:  Blunt abdominal trauma. Fall. Left rib cage injury. EXAM: CT CHEST WITH CONTRAST TECHNIQUE: Multidetector CT imaging of the chest was performed during intravenous contrast administration. CONTRAST:  135mL OMNIPAQUE IOHEXOL 300 MG/ML  SOLN COMPARISON:  Chest x-ray from earlier today FINDINGS: Cardiovascular: Normal heart size. No pericardial effusion. No acute vascular finding. Multifocal atherosclerotic calcification. Mediastinum/Nodes: Negative for adenopathy or mass. Lungs/Pleura: No hemothorax, pneumothorax, or lung contusion. Scarring at the lung bases greater on the right due to the elevated right diaphragm which is chronic. No incidental pneumonia or failure. Upper Abdomen: Colonic interposition between the diaphragm and liver. No acute finding. Musculoskeletal: Advanced scoliosis with bilateral rib cage deformity. Acute left anterolateral 5th to 8th rib fractures with adjacent chest wall swelling. There are remote lower posterior left rib fractures. IMPRESSION: 1. Left fifth through eighth rib fractures without hemothorax or pneumothorax. 2. Scoliosis with chronic low volume chest and asymmetric right diaphragmatic elevation. 3. Aortic Atherosclerosis (ICD10-I70.0). Electronically Signed   By: Monte Fantasia M.D.   On: 01/21/2019 08:58   Dg Chest Portable 1 View  Result Date: 01/21/2019 CLINICAL DATA:  Fall EXAM: PORTABLE CHEST 1 VIEW COMPARISON:  08/03/2018 FINDINGS: Cardiomegaly and aortic tortuosity. Chronic low volume chest with asymmetric elevation of the right diaphragm where there is colonic interposition. There is overlying scarring. Stable appearance of lung markings. There is no edema, consolidation, effusion, or pneumothorax. No appreciable rib fracture. There is prominent osteopenia. Scoliosis. IMPRESSION: Stable low volume chest with cardiomegaly and asymmetric right diaphragm elevation.  Electronically Signed   By: Monte Fantasia M.D.   On: 01/21/2019 05:54   Anti-infectives (From admission, onward)   None      Assessment/Plan Atrial fibrillation on  Eliquis HLD HTN COPD Tobacco abuse CHF - last echo EF 40-45%  Left 5th - 8th rib fractures - No PTX, HPTX. Pulm toliet, IS, scheduled duonebs, PT/OT.   FEN - HH VTE - SCDs, okay for chemical prophylaxis from trauma standpoint ID - None  Plan: As above. Medicine to admit.   Jillyn Ledger, [facility] Surgery 01/21/2019, 9:48 AM Please see Amion for pager number during day hours 7:00am-4:30pm

## 2019-01-21 NOTE — H&P (Signed)
Date: 01/21/2019               Patient Name:  Joann Ford MRN: [id number]  DOB: [birth year] Age / Sex: 73 y.o., female   PCP: Sheral Apley         Medical Service: Internal Medicine Teaching Service         Attending Physician: Dr. Lucious Groves, DO    First Contact: Dr. Court Joy  Pager: [phone]  Second Contact: Dr. Koleen Distance  Pager: [phone]       After Hours (After 5p/  First Contact Pager: [phone]  weekends / holidays): Second Contact Pager: [phone]   Chief Complaint: Rib pain / Fall  History of Present Illness: Joann Ford is a 73 y.o female with atrial fibrillation on Eliquis, HLD, HTN, COPD, scoliosis, and tobacco use disorder who presented after a traumatic fall. History was obtained via the patient and through chart review.  Patient states that she was in her normal state of health when she got up this morning. While attempting to go to the bathroom she subsequently tripped over her bed causing her to fall and strike the left side of her chest. She did not hit her head or experience LOC. EMS was called because her husband was unable to get her up. On their evaluation she had significant swelling of her left chest and advised her to come to the hospital for further evaluation. She states that over the last 2 to 3 months she has had 3 to 4 falls. She does not remember tripping for most of them. She states that she just falls. Her last fall was 1 week ago and resulted in a significant laceration on her right lower extremity. She did not seek medical evaluation after that fall. She uses a walker to ambulate and is fairly limited physically. She states that she is able to move around the house but has significant shortness of breath and pain in her lower extremities that prevents her from leaving the house. 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 Since that time she has noticed a significant functional  decline. She is able to perform all ADLs on her own.   She states that she has never been told that she is hypoxic or requires oxygen. She does continue to smoke at least one pack per day. She does not use a BiPAP/CPAP at night. Review of systems is otherwise negative for headaches, visual changes, neck pain, abdominal pain, nausea/vomiting, dysuria, hematuria, diarrhea, constipation. She does endorse some mild lower extremity swelling and states that she takes Lasix 40 mg once a day for this.  Meds:  Current Facility-Administered Medications for the 01/21/19 encounter [facility] Encounter)  Medication   0.9 %  sodium chloride infusion   Current Meds  Medication Sig   acetaminophen (TYLENOL) 500 MG tablet Take 1,000 mg by mouth every 6 (six) hours as needed (for pain).    albuterol (PROVENTIL) (2.5 MG/3ML) 0.083% nebulizer solution Take 3 mLs (2.5 mg total) by nebulization every 6 (six) hours as needed for wheezing or shortness of breath.   albuterol (VENTOLIN HFA) 108 (90 Base) MCG/ACT inhaler Inhale 2 puffs into the lungs every 6 (six) hours as needed for wheezing or shortness of breath.   apixaban (ELIQUIS) 5 MG TABS tablet Take 1 tablet (5 mg total) by mouth 2 (two) times daily.   diltiazem (CARDIZEM CD) 240 MG 24 hr capsule  Take 1 capsule (240 mg total) by mouth daily.   diphenhydramine-acetaminophen (TYLENOL PM) 25-500 MG TABS tablet Take 2 tablets by mouth at bedtime as needed (for sleep).    escitalopram (LEXAPRO) 20 MG tablet Take 1 tablet (20 mg total) by mouth daily.   furosemide (LASIX) 40 MG tablet Take 1 tablet (40 mg total) by mouth daily.   gabapentin (NEURONTIN) 300 MG capsule Take 300-600 mg by mouth See admin instructions. Take 300 mg by mouth in the morning and 600 mg in the evening   morphine (MS CONTIN) 15 MG 12 hr tablet Take 15 mg by mouth every 12 (twelve) hours.    morphine (MSIR) 15 MG tablet Take 15 mg by mouth See admin instructions. Take 15 mg by mouth in  the morning and 15 mg in the evening   rosuvastatin (CRESTOR) 5 MG tablet Take 1 tablet (5 mg total) by mouth every other day.   Allergies: Allergies as of 01/21/2019 - Review Complete 01/21/2019  Allergen Reaction Noted   Cefadroxil Anaphylaxis, Swelling, and Other (See Comments)    Ciprofloxacin Anaphylaxis and Other (See Comments)    Penicillins Anaphylaxis and Swelling 04/06/2006   Atorvastatin Other (See Comments) 07/22/2017   Past Medical History:  Diagnosis Date   Anxiety    Arthritis    Chronic pain    Hyperlipidemia    Osteoporosis    Family History  Problem Relation Age of Onset   Cancer Father        Jaw Cancer   Pneumonia Mother    Cancer Cousin        jaw   Colon cancer Neg Hx    Stomach cancer Neg Hx    Social History: Previously worked as a Theme park manager for 40 years. Retired 10 months ago. Lives with her husband. Has one daughter that lives in [place]. Two children are deceased. She smokes one pack per day and has done so for over 40 years. She denies the use of alcohol or other illicit substances.  Review of Systems: A complete ROS was negative except as per HPI.   Physical Exam: Blood pressure 129/88, pulse (!) 125, resp. rate (!) 24, height 5\' 4"  (1.626 m), weight 69.2 kg, SpO2 92 %.  General: Elderly female in no acute distress HENT: Normocephalic, atraumatic, moist mucus membranes Pulm: Good air movement with no wheezing or crackles  CV: RRR, systolic crescendo-decrescendo murmur heard best at the right sternal boarder, unable to assess radiation to the carotids, no pulsus parvus or pulsus tardes  Abdomen: Active bowel sounds, soft, non-distended, no tenderness to palpation  Extremities: 4-5cm lateral laceration on the right LE with surrounding erythema but no discharge, Pitting edema around the laceration but minimal to trace of the LLE. Unable to palpate distal pulses in the LEs Skin: Warm and dry  Neuro: Alert and oriented x 3  EKG:  personally reviewed my interpretation is atrial fibrillation with normal axis. Normal QRS morphology. No ST segment changes. Stable compared to prior.  CXR: personally reviewed my interpretation is significant rotation and poor inspiratory volumes. No bony or soft tissue abnormalities. Appear to have bowel gas under the right diaphragm consistent with Chilaiditi's sign.   CT Chest with Contrast  1. Left fifth through eighth rib fractures without hemothorax or pneumothorax. 2. Scoliosis with chronic low volume chest and asymmetric right diaphragmatic elevation. 3. Aortic Atherosclerosis (ICD10-I70.0).  Assessment & Plan by Problem: Active Problems:   Acute respiratory failure with hypoxia (Joann Ford)  Joann Ford is  a 73 y.o female with atrial fibrillation on Eliquis, HLD, HTN, COPD, scoliosis, and tobacco use disorder who presented after a traumatic fall. She was found to have acute hypoxic respiratory failure and left side rib fractures including ribs 5-8. She was subsequently admitted for further pain management and evaluation of her acute hypoxic respiratory failure.   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 - Patient found to be hypoxic to 88% on RA in the ED. Review of pulmonary function testing in 2019 consistent with restrictive lung disease and a decreased DLCO (may be due to pulm HTN vs emphysema)  - CT scan illustrating left fifth through eighth rib fractures without hemothorax or pneumothorax; however, no pulmonary contusion or flail chest.  - Suspect her acute respiratory failure with hypoxia is due to shallow breathing 2/2 pain from her rib fractures in the setting of restrictive and possibly obstructive lung disease with chronic hypercarbic respiratory failure (BMP with consistently elevated CO2)  - Will continue home Morphine XR 15 mg  BID with no added opiates so we do not cause respiratory supression.  - Will add on Toradol for additional pain relief.  - I suspect she would have a mortality benefit from BiPAP given her restrictive lung disease and will therefore order an AM ABG to look for baseline hypercarbic respiratory failure.  - Wean oxygen to maintain SaO2 > 88% - Spirometry  - Continue Albuterol and Duoneb - Ambulate patient  - PT/OT consult - Appreciate Trauma surgery consultation  Atrial Fibrillation  - On Diltiazem 240 mg QD for rate control  - CHADsVASc of 5. On Eliquis for anticoagulation but will hold for the next 48-72 hours before restarting.   HFmrEF Mild AS - TTE in 01/2018 with LVEF of 40-45%, LAE, RAE, and mild aortic stenosis  - Appears fairly euvolemic but will start IV furosemide 20 mg QD  - Monitor I&O's  - Fluid restriction of 184mL per day  Chronic Pain  - Prescribed Morphine XR 15 mg BID for pain. Confirmed in database.  - Continue Morphine XR 15 mg BID   Tobacco Use Disorder  - Smokes 1PPD - PRN Nicotine patch 21 mg   Diet: Heart Healthy  VTE ppx: Lovenox while holding Eliquis  CODE STATUS: DNR  Dispo: Admit patient to Inpatient with expected length of stay greater than 2 midnights.  SignedIna Homes, MD 01/21/2019, 11:08 AM  Pager: [phone]

## 2019-01-21 NOTE — ED Provider Notes (Addendum)
Pt's care assumed by me at 7am.   Ct chest pending.  Ct shows left 5,6,7,8, rib fractures.  Pt off 02 when I was in the room and is sating 89% currently.  02 2 liters placed on pt and sats improved to 92%.  I spoke with Orlinda Blalock with trauma who will see here and evaluate.  They request medicine admission and they will consult. Internal medicine will see for admission   Sidney Ace 01/21/19 [id number]    Fransico Meadow, PA-C 01/21/19 0945    Gareth Morgan, MD 01/22/19 2232

## 2019-01-21 NOTE — Progress Notes (Signed)
Patient arrived to unit with tachycardia. HR 120-140. MD Aslan notified. New order for metoprolol and EKG. Continuing to monitor patient status.

## 2019-01-21 NOTE — ED Notes (Signed)
Patient transported to CT 

## 2019-01-21 NOTE — ED Triage Notes (Signed)
Patient arrived from home via EMS. Patient has scoliosis and has a hx of a-fib. Getting out of the bed, the patient fell to the floor and hit her left rib cage on the side of the bed. Patient was able to get onto her walker with the help of her husband. EMS assessed the patient and found edema and no crepitus. EMS stated that while the patient walked, crepitus was audible. 20 gauge IV in the left wrist.

## 2019-01-22 DIAGNOSIS — L8990 Pressure ulcer of unspecified site, unspecified stage: Secondary | ICD-10-CM | POA: Insufficient documentation

## 2019-01-22 LAB — CBC
HCT: 44.3 % (ref 36.0–46.0)
Hemoglobin: 14.8 g/dL (ref 12.0–15.0)
MCH: 31.4 pg (ref 26.0–34.0)
MCHC: 33.4 g/dL (ref 30.0–36.0)
MCV: 93.9 fL (ref 80.0–100.0)
Platelets: 154 10*3/uL (ref 150–400)
RBC: 4.72 MIL/uL (ref 3.87–5.11)
RDW: 13.2 % (ref 11.5–15.5)
WBC: 8 10*3/uL (ref 4.0–10.5)
nRBC: 0 % (ref 0.0–0.2)

## 2019-01-22 LAB — BLOOD GAS, ARTERIAL
Acid-Base Excess: 11.2 mmol/L — ABNORMAL HIGH (ref 0.0–2.0)
Bicarbonate: 35.9 mmol/L — ABNORMAL HIGH (ref 20.0–28.0)
FIO2: 32
O2 Saturation: 86.7 %
Patient temperature: 36.9
pCO2 arterial: 53.1 mmHg — ABNORMAL HIGH (ref 32.0–48.0)
pH, Arterial: 7.445 (ref 7.350–7.450)
pO2, Arterial: 51.1 mmHg — ABNORMAL LOW (ref 83.0–108.0)

## 2019-01-22 LAB — BASIC METABOLIC PANEL
Anion gap: 13 (ref 5–15)
BUN: 7 mg/dL — ABNORMAL LOW (ref 8–23)
CO2: 30 mmol/L (ref 22–32)
Calcium: 8.6 mg/dL — ABNORMAL LOW (ref 8.9–10.3)
Chloride: 100 mmol/L (ref 98–111)
Creatinine, Ser: 0.62 mg/dL (ref 0.44–1.00)
GFR calc Af Amer: >60 mL/min (ref >60)
GFR calc non Af Amer: >60 mL/min (ref >60)
Glucose, Bld: 85 mg/dL (ref 70–99)
Potassium: 3.1 mmol/L — ABNORMAL LOW (ref 3.5–5.1)
Sodium: 143 mmol/L (ref 135–145)

## 2019-01-22 LAB — MAGNESIUM: Magnesium: 1.9 mg/dL (ref 1.7–2.4)

## 2019-01-22 MED ORDER — IPRATROPIUM-ALBUTEROL 0.5-2.5 (3) MG/3ML IN SOLN
3.0000 mL | Freq: Two times a day (BID) | RESPIRATORY_TRACT | Status: DC
  Administered 2019-01-22 (×2): 3 mL via RESPIRATORY_TRACT
  Filled 2019-01-22 (×2): qty 3

## 2019-01-22 MED ORDER — ONDANSETRON 4 MG PO TBDP
4.0000 mg | ORAL_TABLET | Freq: Four times a day (QID) | ORAL | Status: DC | PRN
  Administered 2019-01-22: 4 mg via ORAL
  Filled 2019-01-22: qty 1

## 2019-01-22 MED ORDER — POTASSIUM CHLORIDE CRYS ER 20 MEQ PO TBCR
40.0000 meq | EXTENDED_RELEASE_TABLET | Freq: Two times a day (BID) | ORAL | Status: AC
  Administered 2019-01-22 (×2): 40 meq via ORAL
  Filled 2019-01-22 (×2): qty 2

## 2019-01-22 MED ORDER — APIXABAN 5 MG PO TABS
5.0000 mg | ORAL_TABLET | Freq: Two times a day (BID) | ORAL | Status: DC
  Administered 2019-01-23: 5 mg via ORAL
  Filled 2019-01-22: qty 1

## 2019-01-22 MED ORDER — ONDANSETRON HCL 4 MG/2ML IJ SOLN: 4.0000 mg | Freq: Four times a day (QID) | INTRAMUSCULAR | Status: DC | PRN

## 2019-01-22 NOTE — Evaluation (Signed)
Physical Therapy Evaluation Patient Details Name: Joann Ford MRN: [id number] DOB: [birth year] Today's Date: 01/22/2019   History of Present Illness  Joann Ford is a 73 y.o. with atrial fibrillation on Eliquis, HLD, HTN,COPD, scoliosis, and tabacco use disorder who presented after a tramautic fall. Patient presented in acute hypoxic respiratory failure and found to have fractured ribs 5-8 on her left side.   Clinical Impression  Pt admitted with above diagnosis. Pt presents with decreased functional mobility secondary to poor cardiovascular endurance, balance impairments, decreased cognition, generalized weakness and abnormal posture. Requiring moderate assist for bed mobility, transferring and ambulating limited room distances at a min assist level using walker. Desaturation to 79% when pt removing nasal cannula to blow nose; replaced and rebounded to > 90% on 4L O2. HR peak 127 bpm. Pt currently with functional limitations due to the deficits listed below (see PT Problem List). Pt will benefit from skilled PT to increase their independence and safety with mobility to allow discharge to the venue listed below.       Follow Up Recommendations SNF;Supervision/Assistance - 24 hour    Equipment Recommendations  3in1 (PT)    Recommendations for Other Services       Precautions / Restrictions Precautions Precautions: Fall;Other (comment) Precaution Comments: scoliosis, watch HR/O2 Restrictions Weight Bearing Restrictions: No      Mobility  Bed Mobility Overal bed mobility: Needs Assistance Bed Mobility: Sidelying to Sit   Sidelying to sit: Mod assist       General bed mobility comments: ModA for trunk elevation and use of bed pad to scoot hips forward  Transfers Overall transfer level: Needs assistance Equipment used: Rolling walker (2 wheeled) Transfers: Sit to/from Stand Sit to Stand: Min assist         General transfer comment: MinA to stand from edge of bed and  toilet; cues for hand placement  Ambulation/Gait Ambulation/Gait assistance: Min assist Gait Distance (Feet): 20 Feet Assistive device: Rolling walker (2 wheeled) Gait Pattern/deviations: Step-through pattern;Decreased stride length;Trunk flexed     General Gait Details: Needs max cues for walker proximity, keeping feet on inside of walker. MinA for stability and walker/line management  Stairs            Wheelchair Mobility    Modified Rankin (Stroke Patients Only)       Balance Overall balance assessment: Needs assistance Sitting-balance support: Feet supported Sitting balance-Leahy Scale: Fair     Standing balance support: Bilateral upper extremity supported Standing balance-Leahy Scale: Poor Standing balance comment: reliant on external support                             Pertinent Vitals/Pain Pain Assessment: 0-10 Pain Score: 8  Pain Location: left flank Pain Descriptors / Indicators: Grimacing;Guarding Pain Intervention(s): Monitored during session;Repositioned;Limited activity within patient's tolerance    Home Living Family/patient expects to be discharged to:: Private residence Living Arrangements: Spouse/significant other Available Help at Discharge: Family;Available 24 hours/day Type of Home: House Home Access: Stairs to enter Entrance Stairs-Rails: None Entrance Stairs-Number of Steps: 1 Home Layout: One level Home Equipment: Walker - 2 wheels;Walker - 4 wheels      Prior Function Level of Independence: Independent with assistive device(s)         Comments: Retired Theme park manager; uses Soil scientist Dominance   Dominant Hand: Right    Extremity/Trunk Assessment   Upper Extremity Assessment Upper Extremity Assessment: Defer to OT evaluation  Lower Extremity Assessment Lower Extremity Assessment: Generalized weakness    Cervical / Trunk Assessment Cervical / Trunk Assessment: Kyphotic;Other exceptions Cervical /  Trunk Exceptions: scoliosis  Communication   Communication: No difficulties  Cognition Arousal/Alertness: Awake/alert Behavior During Therapy: WFL for tasks assessed/performed Overall Cognitive Status: Impaired/Different from baseline Area of Impairment: Memory;Safety/judgement;Problem solving                     Memory: Decreased short-term memory   Safety/Judgement: Decreased awareness of safety;Decreased awareness of deficits   Problem Solving: Difficulty sequencing;Requires verbal cues;Requires tactile cues General Comments: Pt oriented x 4 with increased time provided; initially stating month was February, then correcting to November. Slow to respond at times and decreased awareness of safety/deficits      General Comments      Exercises     Assessment/Plan    PT Assessment Patient needs continued PT services  PT Problem List Decreased strength;Decreased activity tolerance;Decreased balance;Decreased mobility;Decreased cognition;Decreased safety awareness;Pain       PT Treatment Interventions DME instruction;Gait training;Stair training;Functional mobility training;Therapeutic activities;Therapeutic exercise;Balance training;Patient/family education    PT Goals (Current goals can be found in the Care Plan section)  Acute Rehab PT Goals Patient Stated Goal: none stated; agreeable to therapy PT Goal Formulation: With patient Time For Goal Achievement: 02/05/19 Potential to Achieve Goals: Good    Frequency Min 3X/week   Barriers to discharge        Co-evaluation               AM-PAC PT "6 Clicks" Mobility  Outcome Measure Help needed turning from your back to your side while in a flat bed without using bedrails?: A Little Help needed moving from lying on your back to sitting on the side of a flat bed without using bedrails?: A Lot Help needed moving to and from a bed to a chair (including a wheelchair)?: A Little Help needed standing up from a chair  using your arms (e.g., wheelchair or bedside chair)?: A Little Help needed to walk in hospital room?: A Little Help needed climbing 3-5 steps with a railing? : A Lot 6 Click Score: 16    End of Session Equipment Utilized During Treatment: Gait belt;Oxygen Activity Tolerance: Patient limited by fatigue Patient left: in bed;with call bell/phone within reach;with bed alarm set Nurse Communication: Mobility status PT Visit Diagnosis: Unsteadiness on feet (R26.81);Other abnormalities of gait and mobility (R26.89);History of falling (Z91.81);Muscle weakness (generalized) (M62.81);Difficulty in walking, not elsewhere classified (R26.2);Pain Pain - Right/Left: Left Pain - part of body: (ribs)    Time: [id number] PT Time Calculation (min) (ACUTE ONLY): 31 min   Charges:   PT Evaluation $PT Eval Moderate Complexity: 1 Mod PT Treatments $Gait Training: 8-22 mins        Ellamae Sia, PT, DPT Acute Rehabilitation Services Pager [phone] Office [phone]   Willy Eddy 01/22/2019, 1:15 PM

## 2019-01-22 NOTE — Progress Notes (Signed)
Unable to locate yesterday's nicotine patch on patient for removal.  New patch applied today.

## 2019-01-22 NOTE — Progress Notes (Signed)
Patient ambulated with front wheeled walker to the end of nurse's desk and back this afternoon.  Patient's O2 sat dropped into the 80's despite wearing O2 at 3Liters. Patient able to deep breath and recover easily before moving on.  Patient denied any dizziness or fatigue.  Gait steady.

## 2019-01-22 NOTE — Progress Notes (Signed)
Trauma Service Note  Chief Complaint/Subjective: Continued left chest pain, does not feel short of breath  Objective: Vital signs in last 24 hours: Temp:  [98.1 F (36.7 C)-98.4 F (36.9 C)] 98.4 F (36.9 C) (11/21 0413) Pulse Rate:  [96-143] 97 (11/20 2103) Resp:  [18-23] 19 (11/20 2103) BP: (121-134)/(75-96) 121/75 (11/20 2103) SpO2:  [89 %-99 %] 98 % (11/21 0724) Last BM Date: 01/20/19  Intake/Output from previous day: 11/20 0701 - 11/21 0700 In: 240 [P.O.:240] Out: -  Intake/Output this shift: Total I/O In: -  Out: 300 [Urine:300]  General: NAD  Lungs: nonlabored, 500 ml on IS  Abd: no tenderness  Extremities: no edema  Neuro: AOx4  Lab Results: CBC  Recent Labs    01/21/19 0520 01/22/19 0231  WBC 10.2 8.0  HGB 15.8* 14.8  HCT 49.4* 44.3  PLT 178 154   BMET Recent Labs    01/21/19 0520 01/22/19 0231  NA 142 143  K 3.7 3.1*  CL 100 100  CO2 31 30  GLUCOSE 117* 85  BUN 10 7*  CREATININE 0.88 0.62  CALCIUM 8.8* 8.6*   PT/INR No results for input(s): LABPROT, INR in the last 72 hours. ABG Recent Labs    01/22/19 0825  PHART 7.445  HCO3 35.9*    Studies/Results: No results found.  Anti-infectives: Anti-infectives (From admission, onward)   None      Medications Scheduled Meds: . diltiazem  240 mg Oral Daily  . enoxaparin (LOVENOX) injection  40 mg Subcutaneous Q24H  . escitalopram  20 mg Oral Daily  . furosemide  20 mg Intravenous Daily  . gabapentin  300 mg Oral q morning - 10a   And  . gabapentin  600 mg Oral Q2000  . ipratropium-albuterol  3 mL Nebulization BID  . metoprolol tartrate  12.5 mg Oral BID  . morphine  15 mg Oral Q12H  . nicotine  21 mg Transdermal Daily  . polyethylene glycol  17 g Oral Daily  . potassium chloride  40 mEq Oral BID WC  . rosuvastatin  5 mg Oral QODAY   Continuous Infusions: PRN Meds:.albuterol, ketorolac  Assessment/Plan: Left 5th - 8th rib fractures - No PTX, HPTX. Pulm toliet, IS,  flutter valve, scheduled duonebs, PT/OT.     LOS: 1 day   [place] Trauma Surgeon [phone] [facility] 01/22/2019

## 2019-01-22 NOTE — Progress Notes (Signed)
   Subjective: Overnight events: Heart rate increased.  Metoprolol 12.5 twice daily was added on top of po dilt for rate control.   Patient reports shortness of breath is improving this morning. Alert. Oriented to self, situation. (Says its year 2000, but knows Software engineer. Knows she is in hospital, but thought she was in [facility])  RN weaning O2, on 2L during exam. Pain controlled.   Objective:  Vital signs in last 24 hours: Vitals:   01/21/19 2038 01/21/19 2103 01/21/19 2349 01/22/19 0413  BP:  121/75    Pulse:  97    Resp:  19    Temp: 98.3 F (36.8 C)  98.3 F (36.8 C) 98.4 F (36.9 C)  TempSrc:      SpO2:  99%    Weight:      Height:        Intake/Output Summary (Last 24 hours) at 01/22/2019 1030 Last data filed at 01/22/2019 0800 Gross per 24 hour  Intake 240 ml  Output 300 ml  Net -60 ml     Physical Exam:  GEN: Elderly female with scoliosis, NAD  Pulm: On 2 L [place] Cardiovascular: Tachy, irregular rythm Abdomen   Assessment/Plan:  Active Problems:   Acute respiratory failure with hypoxia (HCC)   Multiple closed fractures of ribs of left side  Tanaka Andretta is a 73 y.o. with atrial fibrillation on Eliquis, HLD, HTN,COPD, scoliosis, and tabacco use disorder who presented after a tramautic fall. Patient presented in acute hypoxic respiratory failure and found to have fractured ribs 5-8 on her left side.   #Acute Hypoxic Respiratory Failure 2/2 left 5th thorough 8th rib fractures. -PFT in 2019, hx of severe restrictive lung disease 2/2 scoliosis and COPD. - Appreciate Trauma surgery consultation on 11/20 - SOB improving, but requiring supplemental oxygen.  - Continue to manage pain with home Morphine XR 15 mg BID, Toradol PRN - F/U ABG - Wean Oxygen to maintain 88-92%  - Continue Albuterol and Duoneb - PT/OT  #Atrial Fibrillation - ON Diltiazem 240 mg QD for rate control - Metoprolol tartrate 12.5 BID added overnight - Will go up on Metoprolol tartrate to  25 mg if needed for rate control  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%, LAE, RAE, and mild aortic stenosis - euvolemic on exam , net negative 60 ml - Continue daily IV lasix 20 mg - Fluid restriction 1864ml  Per day  #Chronic Pain - Continue Morphine XR 15 mg BID  #Tobacco use disorder - smokes 1ppd - PRN Nicotine Patch 21 mg  Dispo: Discharge pending continued clinical improvement.   Tamsen Snider, MD PGY1  [phone]

## 2019-01-22 NOTE — Evaluation (Signed)
Occupational Therapy Evaluation Patient Details Name: Joann Ford MRN: [id number] DOB: May 31, 1945 Today's Date: 01/22/2019    History of Present Illness Joann Ford is a 73 y.o. with atrial fibrillation on Eliquis, HLD, HTN,COPD, scoliosis, and tabacco use disorder who presented after a tramautic fall. Patient presented in acute hypoxic respiratory failure and found to have fractured ribs 5-8 on her left side.    Clinical Impression   Pt PTA: Pt living with spouse and reports independence. Pt oriented x4 with increased time; pt sitting on commode and stating "did I ask to come in here?" Pt's kyphotic posture and scoliosis is very concerning for safety as she bends down even further for comode transfer. Pt limited by poor cognition with poor short term memory, poor strength and activity tolerance. Pt's pulse ox O2 hard to read as pt was gripping RW; would show 79% O2 with a bad reading and then jump to 90% O2 with a decent line. Pt on 3-4L O2 throughout; HR increased to 122 BPM with exertion. Pt minA to modA for ADL tasks; minA overall for bed mobility, transfers and ADL functional mobility in room with RW. Pt would benefit from continued OT skilled services for ADL, mobility and cognition. OT following.      Follow Up Recommendations  SNF;Supervision/Assistance - 24 hour    Equipment Recommendations  Other (comment)(to be determined at next venue)    Recommendations for Other Services       Precautions / Restrictions Precautions Precautions: Fall;Other (comment) Precaution Comments: scoliosis, watch HR/O2 Restrictions Weight Bearing Restrictions: No      Mobility Bed Mobility Overal bed mobility: Needs Assistance Bed Mobility: Sidelying to Sit   Sidelying to sit: Min assist       General bed mobility comments: MinA for trunk elevation and use of bed pad to scoot hips forward  Transfers Overall transfer level: Needs assistance Equipment used: Rolling walker (2  wheeled) Transfers: Sit to/from Stand Sit to Stand: Min assist         General transfer comment: MinA to stand from edge of bed and toilet; cues for hand placement    Balance Overall balance assessment: Needs assistance Sitting-balance support: Feet supported Sitting balance-Leahy Scale: Fair     Standing balance support: Bilateral upper extremity supported Standing balance-Leahy Scale: Poor Standing balance comment: reliant on external support                           ADL either performed or assessed with clinical judgement   ADL Overall ADL's : Needs assistance/impaired Eating/Feeding: Set up;Sitting   Grooming: Minimal assistance;Sitting;Standing Grooming Details (indicate cue type and reason): unable to comb back of head Upper Body Bathing: Minimal assistance;Cueing for safety;Sitting;Standing   Lower Body Bathing: Moderate assistance;Cueing for safety;Cueing for sequencing;Sitting/lateral leans;Sit to/from stand   Upper Body Dressing : Minimal assistance;Cueing for safety;Cueing for sequencing;Sitting   Lower Body Dressing: Moderate assistance;Cueing for safety;Cueing for sequencing;Sitting/lateral leans;Sit to/from stand   Toilet Transfer: Minimal assistance;Ambulation;Regular Toilet;RW;Grab bars Toilet Transfer Details (indicate cue type and reason): pt's kyphotic posture is bery concerning for safety as she bends down even further for comode transfer. Toileting- Clothing Manipulation and Hygiene: Minimal assistance;Cueing for safety;Cueing for sequencing;Sitting/lateral lean;Sit to/from stand Toileting - Clothing Manipulation Details (indicate cue type and reason): Assist to hold gown up     Functional mobility during ADLs: Minimal assistance;Cueing for safety;Cueing for sequencing;Rolling walker General ADL Comments: Pt limited by poor body habitus with kyphosis  and scoliosis; poor strength and activity tolerance.     Vision Baseline Vision/History:  No visual deficits Vision Assessment?: No apparent visual deficits     Perception     Praxis      Pertinent Vitals/Pain Pain Assessment: 0-10 Pain Score: 8  Pain Location: left flank Pain Descriptors / Indicators: Grimacing;Guarding Pain Intervention(s): Limited activity within patient's tolerance;Monitored during session;Premedicated before session     Hand Dominance Right   Extremity/Trunk Assessment Upper Extremity Assessment Upper Extremity Assessment: Generalized weakness   Lower Extremity Assessment Lower Extremity Assessment: Generalized weakness   Cervical / Trunk Assessment Cervical / Trunk Assessment: Kyphotic;Other exceptions Cervical / Trunk Exceptions: scoliosis   Communication Communication Communication: No difficulties   Cognition Arousal/Alertness: Awake/alert Behavior During Therapy: WFL for tasks assessed/performed Overall Cognitive Status: Impaired/Different from baseline Area of Impairment: Memory;Safety/judgement;Problem solving                     Memory: Decreased short-term memory   Safety/Judgement: Decreased awareness of safety;Decreased awareness of deficits   Problem Solving: Difficulty sequencing;Requires verbal cues;Requires tactile cues General Comments: Pt oriented x4 with increased time; pt sitting on commode and stating "did I ask to come in here?"   General Comments  O2 hard to read as pt was gripping RW; would show 79% O2 with a bad reading and then jump to 90% O2 with a decent line. Pt on 3-4L O2 throughout; HR increased to 122 BPM with exertion.    Exercises     Shoulder Instructions      Home Living Family/patient expects to be discharged to:: Private residence Living Arrangements: Spouse/significant other Available Help at Discharge: Family;Available 24 hours/day Type of Home: House Home Access: Stairs to enter [employer] of Steps: 1 Entrance Stairs-Rails: None Home Layout: One level      Bathroom Shower/Tub: Occupational psychologist: Standard     Home Equipment: Environmental consultant - 2 wheels;Walker - 4 wheels          Prior Functioning/Environment Level of Independence: Independent with assistive device(s)        Comments: Retired Theme park manager; uses Materials engineer Problem List: Decreased strength;Decreased activity tolerance;Impaired balance (sitting and/or standing);Decreased safety awareness;Impaired UE functional use;Pain      OT Treatment/Interventions: Self-care/ADL training;Therapeutic exercise;Neuromuscular education;Energy conservation;DME and/or AE instruction;Therapeutic activities;Cognitive remediation/compensation;Visual/perceptual remediation/compensation;Patient/family education;Balance training    OT Goals(Current goals can be found in the care plan section) Acute Rehab OT Goals Patient Stated Goal: none stated; agreeable to therapy OT Goal Formulation: With patient Time For Goal Achievement: 02/05/19 Potential to Achieve Goals: Good ADL Goals Pt Will Perform Grooming: with modified independence;sitting Pt Will Perform Lower Body Dressing: with modified independence;sitting/lateral leans;sit to/from stand Pt Will Perform Toileting - Clothing Manipulation and hygiene: with modified independence;sitting/lateral leans;sit to/from stand Pt/caregiver will Perform Home Exercise Program: Increased strength;Both right and left upper extremity;Independently Additional ADL Goal #1: Pt will participate in pillbox test and achieve 90% or greater with no verbal cues to complete task.  OT Frequency: Min 2X/week   Barriers to D/C:            Co-evaluation              AM-PAC OT "6 Clicks" Daily Activity     Outcome Measure Help from another person eating meals?: A Little Help from another person taking care of personal grooming?: A Little Help from another person toileting, which includes using toliet, bedpan, or urinal?:  A Lot Help from another  person bathing (including washing, rinsing, drying)?: A Lot Help from another person to put on and taking off regular upper body clothing?: A Little Help from another person to put on and taking off regular lower body clothing?: A Lot 6 Click Score: 15   End of Session Equipment Utilized During Treatment: Gait belt;Rolling walker Nurse Communication: Mobility status  Activity Tolerance: Patient limited by pain;Treatment limited secondary to medical complications (Comment) Patient left: in chair;with call bell/phone within reach  OT Visit Diagnosis: Unsteadiness on feet (R26.81);Muscle weakness (generalized) (M62.81);Pain Pain - Right/Left: Left Pain - part of body: (ribs)                Time: [id number] OT Time Calculation (min): 27 min Charges:  OT General Charges $OT Visit: 1 Visit OT Evaluation $OT Eval Moderate Complexity: 1 Mod OT Treatments $Self Care/Home Management : 8-22 mins  Ebony Hail Harold Hedge) Marsa Aris OTR/L Acute Rehabilitation Services Pager: [phone] Office: [phone] 01/22/2019, 4:44 PM

## 2019-01-23 DIAGNOSIS — Z720 Tobacco use: Secondary | ICD-10-CM

## 2019-01-23 LAB — BASIC METABOLIC PANEL
Anion gap: 7 (ref 5–15)
BUN: 14 mg/dL (ref 8–23)
CO2: 34 mmol/L — ABNORMAL HIGH (ref 22–32)
Calcium: 8.7 mg/dL — ABNORMAL LOW (ref 8.9–10.3)
Chloride: 102 mmol/L (ref 98–111)
Creatinine, Ser: 0.69 mg/dL (ref 0.44–1.00)
GFR calc Af Amer: >60 mL/min (ref >60)
GFR calc non Af Amer: >60 mL/min (ref >60)
Glucose, Bld: 98 mg/dL (ref 70–99)
Potassium: 4.2 mmol/L (ref 3.5–5.1)
Sodium: 143 mmol/L (ref 135–145)

## 2019-01-23 LAB — MAGNESIUM: Magnesium: 2 mg/dL (ref 1.7–2.4)

## 2019-01-23 LAB — CBC
HCT: 45 % (ref 36.0–46.0)
Hemoglobin: 14.8 g/dL (ref 12.0–15.0)
MCH: 31 pg (ref 26.0–34.0)
MCHC: 32.9 g/dL (ref 30.0–36.0)
MCV: 94.1 fL (ref 80.0–100.0)
Platelets: 171 10*3/uL (ref 150–400)
RBC: 4.78 MIL/uL (ref 3.87–5.11)
RDW: 13.2 % (ref 11.5–15.5)
WBC: 8.5 10*3/uL (ref 4.0–10.5)
nRBC: 0 % (ref 0.0–0.2)

## 2019-01-23 NOTE — Progress Notes (Signed)
   Subjective: Patient reports she is ready to go home.  She is still requiring supplemental oxygen to maintain oxygen saturation.  Gust going to SNF, but she would like to go home.  Her husband is in the room and also would prefer if his wife with home.  Discussed need for supplemental oxygen while she is recovering from her injury and plan to arrange this today if possible.  Objective:  Vital signs in last 24 hours: Vitals:   01/23/19 0017 01/23/19 0330 01/23/19 0345 01/23/19 0417  BP:  115/81    Pulse:  (!) 103 85   Resp:  13 13   Temp: 98.5 F (36.9 C)   98.6 F (37 C)  TempSrc: Oral   Oral  SpO2:  90% 90%   Weight:      Height:        Intake/Output Summary (Last 24 hours) at 01/23/2019 0640 Last data filed at 01/22/2019 0800 Gross per 24 hour  Intake -  Output 300 ml  Net -300 ml     Physical Exam: GEN: Elderly female with scoliosis, NAD  Pulm: On 2 L [place]ms, not using accessory muscles, clear to auscultation bilaterally Cardiovascular: Tachy, irregular rythm    Assessment/Plan:  Active Problems:   Acute respiratory failure with hypoxia (HCC)   Multiple closed fractures of ribs of left side   Pressure injury of skin  Joann Ford is a 73 y.o. with atrial fibrillation on Eliquis, HLD, HTN,COPD, scoliosis, and tabacco use disorder who presented after a tramautic fall. Patient presented in acute hypoxic respiratory failure and found to have fractured ribs 5-8 on her left side.   Appreciate PT OT notes and recommendation of SNF.  Patient politely refused and will need to arrange home health needs before discharge.  #Acute Hypoxic Respiratory Failure 2/2 left 5th thorough 8th rib fractures. -PFT in 2019, hx of severe restrictive lung disease 2/2 scoliosis and COPD. - Appreciate Trauma surgery consultation on 11/20 - SOB improving, but requiring supplemental oxygen.  - Continue to manage pain with home Morphine XR 15 mg BID, Toradol PRN - F/U ABG - Wean Oxygen to  maintain 88-92%  - Continue Albuterol and Duoneb - PT/OT -Home oxygen, patient likely will require supplemental oxygen until her ribs have healed   #Atrial Fibrillation - ON Diltiazem 240 mg QD for rate control - Metoprolol tartrate 12.5 BID added overnight - Rate controlled today.  Could consider going up on diltiazem if patient needs more rate control.  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  Tamsen Snider, MD PGY1  [phone]

## 2019-01-23 NOTE — Progress Notes (Signed)
Attempted to wean patient off oxygen.  O2 sat dropped to 82 on RA with patient sitting quietly resting in chair.  Patient denied any SOB.  O2 reapplied at 1 Liter via nasal cannula and O2 sat went up to 90%.

## 2019-01-23 NOTE — Discharge Summary (Signed)
Name: Joann Ford MRN: [id number] DOB: [birth year] 73 y.o. PCP: Sheral Apley  Date of Admission: 01/21/2019  5:01 AM Date of Discharge: 01/23/2019 Attending Physician: Aldine Contes  Discharge Diagnosis: 1. Acute hypoxic respiratory failure secondary left fifth through eighth rib fracture 2.  Atrial fibrillation with rapid ventricular rate  Discharge Medications: Allergies as of 01/23/2019      Reactions   Cefadroxil Anaphylaxis, Swelling, Other (See Comments)   Throat closes   Ciprofloxacin Anaphylaxis, Other (See Comments)   Throat closes   Penicillins Anaphylaxis, Swelling   Did it involve swelling of the face/tongue/throat, SOB, or low BP? Yes Did it involve sudden or severe rash/hives, skin peeling, or any reaction on the inside of your mouth or nose? No Did you need to seek medical attention at a hospital or doctor's office? Yes When did it last happen?"10-15 years ago" If all above answers are "NO", may proceed with cephalosporin use.   Atorvastatin Other (See Comments)   Possible SAMS - 40 mg dose      Medication List    TAKE these medications   acetaminophen 500 MG tablet Commonly known as: TYLENOL Take 1,000 mg by mouth every 6 (six) hours as needed (for pain).   Ventolin HFA 108 (90 Base) MCG/ACT inhaler Generic drug: albuterol Inhale 2 puffs into the lungs every 6 (six) hours as needed for wheezing or shortness of breath.   albuterol (2.5 MG/3ML) 0.083% nebulizer solution Commonly known as: PROVENTIL Take 3 mLs (2.5 mg total) by nebulization every 6 (six) hours as needed for wheezing or shortness of breath.   apixaban 5 MG Tabs tablet Commonly known as: ELIQUIS Take 1 tablet (5 mg total) by mouth 2 (two) times daily.   diltiazem 240 MG 24 hr capsule Commonly known as: CARDIZEM CD Take 1 capsule (240 mg total) by mouth daily.   diphenhydramine-acetaminophen 25-500 MG Tabs tablet Commonly known as: TYLENOL PM Take 2 tablets by  mouth at bedtime as needed (for sleep).   escitalopram 20 MG tablet Commonly known as: LEXAPRO Take 1 tablet (20 mg total) by mouth daily.   furosemide 40 MG tablet Commonly known as: LASIX Take 1 tablet (40 mg total) by mouth daily.   gabapentin 300 MG capsule Commonly known as: NEURONTIN Take 300-600 mg by mouth See admin instructions. Take 300 mg by mouth in the morning and 600 mg in the evening   morphine 15 MG 12 hr tablet Commonly known as: MS CONTIN Take 15 mg by mouth every 12 (twelve) hours. What changed: Another medication with the same name was removed. Continue taking this medication, and follow the directions you see here.   rosuvastatin 5 MG tablet Commonly known as: CRESTOR Take 1 tablet (5 mg total) by mouth every other day.            Durable Medical Equipment  (From admission, onward)         Start     Ordered   01/23/19 1313  For home use only DME oxygen  Once    Question Answer Comment  Length of Need 6 Months   Mode or (Route) Nasal cannula   Liters per Minute 1   Frequency Continuous (stationary and portable oxygen unit needed)   Oxygen conserving device Yes   Oxygen delivery system Gas      01/23/19 1313          Disposition and follow-up:   Ms.Joann Ford was discharged from [facility]  in Stable condition.  At the hospital follow up visit please address:  1.  Acute hypoxic respiratory failure 2/2 left 5th - 8th Rib Fracture Patient requiring 1 L of supplemental oxygen while ambulating to maintain sats greater than 88%. Sent home with home oxygen - Ambulate patient without supplemental oxygen - Pain controled with home Morphine XR 15 mg BID  Atrial Fibrillation - On Diltiazam 240 mg QD on admission. Started Metoprolol 12.5 mg during admission and titrated up to 25 mg BID - On Eliquis 5mg  BID - Check rate and rhythm on follow up , titrate medication as needed     2.  Labs / imaging needed at time of follow-up:    3.  Pending labs/ test needing follow-up:   Follow-up Appointments:   Hospital Course by problem list: 1. #Acute Hypoxic Respiratory Failure 2/2 left 5th thorough 8th rib fractures. PFT in 2019, hx of severe restrictive lung disease 2/2 scoliosis and COPD. Patient presented after a fall and found to have fracture her ribs. - Trauma surgery consultation on 11/20, no need for surgery - SOB improving daily, but requiring supplemental oxygen. PT, OT, Pulm Toilet. - Continue to manage pain with home Morphine XR 15 mg BID, Toradol PRN - Patient discharged on 1L supplemental oxygen.  #Atrial Fibrillation - ON Diltiazem 240 mg QD for rate control - Metoprolol tartrate 12.5 BID added overnight, Titrated up to 25 mg BID at discharge - Rate controlled today.   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%, LAE, RAE, and mild aortic stenosis - euvolemic on exam  - Continue daily IV lasix 20 mg - Fluid restriction 1863ml  Per day  #Chronic Pain - Continue Morphine XR 15 mg BID  #Tobacco use disorder - smokes 1ppd - PRN Nicotine Patch 21 mg    Discharge Vitals:   BP 115/83   Pulse (!) 119   Temp 98.4 F (36.9 C) (Oral)   Resp 19   Ht 5\' 4"  (1.626 m)   Wt 69.2 kg   SpO2 93%   BMI 26.19 kg/m   Pertinent Labs, Studies, and Procedures:  CBC Latest Ref Rng & Units 01/23/2019 01/22/2019 01/21/2019  WBC 4.0 - 10.5 K/uL 8.5 8.0 10.2  Hemoglobin 12.0 - 15.0 g/dL 14.8 14.8 15.8(H)  Hematocrit 36.0 - 46.0 % 45.0 44.3 49.4(H)  Platelets 150 - 400 K/uL 171 154 178   BMP Latest Ref Rng & Units 01/23/2019 01/22/2019 01/21/2019  Glucose 70 - 99 mg/dL 98 85 117(H)  BUN 8 - 23 mg/dL 14 7(L) 10  Creatinine 0.44 - 1.00 mg/dL 0.69 0.62 0.88  Sodium 135 - 145 mmol/L 143 143 142  Potassium 3.5 - 5.1 mmol/L 4.2 3.1(L) 3.7  Chloride 98 - 111 mmol/L 102 100 100  CO2 22 - 32 mmol/L 34(H) 30 31  Calcium 8.9 - 10.3 mg/dL 8.7(L) 8.6(L) 8.8(L)    FINDINGS: Cardiovascular: Normal  heart size. No pericardial effusion. No acute vascular finding. Multifocal atherosclerotic calcification.  Mediastinum/Nodes: Negative for adenopathy or mass.  Lungs/Pleura: No hemothorax, pneumothorax, or lung contusion. Scarring at the lung bases greater on the right due to the elevated right diaphragm which is chronic. No incidental pneumonia or failure.  Upper Abdomen: Colonic interposition between the diaphragm and liver. No acute finding.  Musculoskeletal: Advanced scoliosis with bilateral rib cage deformity. Acute left anterolateral 5th to 8th rib fractures with adjacent chest wall swelling. There are remote lower posterior left rib fractures.  IMPRESSION: 1. Left fifth through eighth  rib fractures without hemothorax or pneumothorax. 2. Scoliosis with chronic low volume chest and asymmetric right diaphragmatic elevation. 3. Aortic Atherosclerosis (ICD10-I70.0).   Discharge Instructions: Discharge Instructions    Call MD for:  difficulty breathing, headache or visual disturbances   Complete by: As directed    Call MD for:  severe uncontrolled pain   Complete by: As directed    Call MD for:  temperature >100.4   Complete by: As directed    Diet - low sodium heart healthy   Complete by: As directed    Increase activity slowly   Complete by: As directed       Signed:  Tamsen Snider, MD PGY1  [phone]

## 2019-01-23 NOTE — Progress Notes (Signed)
New order to discharge patient home.  All discharge paperwork reviewed with patient who verbalized understanding.  O2 delivered to patient's room for transportation home.  Patient's husband notified of discharge and met patient at main entrance to transport patient home.  Nurse tech escorted patient and her belongings to the main entrance via wheelchair.

## 2019-01-23 NOTE — Progress Notes (Signed)
SATURATION QUALIFICATIONS: (This note is used to comply with regulatory documentation for home oxygen)  Patient Saturations on Room Air at Rest =86%  Patient Saturations on Room Air while Ambulating = 84%  Patient Saturations on 1 Liters of oxygen while Ambulating = 94%  Please briefly explain why patient needs home oxygen: She de-sats without oxygen, especially during ambulation.

## 2019-01-23 NOTE — Progress Notes (Signed)
   Subjective/Chief Complaint: Pt ambulating.  con't on O2 Working on IS   Objective: Vital signs in last 24 hours: Temp:  [98.3 F (36.8 C)-98.6 F (37 C)] 98.4 F (36.9 C) (11/22 0753) Pulse Rate:  [69-119] 119 (11/22 0753) Resp:  [13-23] 19 (11/22 0753) BP: (111-116)/(62-83) 115/83 (11/22 0753) SpO2:  [90 %-94 %] 93 % (11/22 0753) Last BM Date: 01/20/19  Intake/Output from previous day: 11/21 0701 - 11/22 0700 In: 240 [P.O.:240] Out: 650 [Urine:650] Intake/Output this shift: No intake/output data recorded. ] Constitutional: No acute distress, conversant, appears states age. Eyes: Anicteric sclerae, moist conjunctiva, no lid lag Lungs: Clear to auscultation bilaterally, normal respiratory effort, BNC, 500cc on IS CV: regular rate and rhythm, no murmurs, no peripheral edema, pedal pulses 2+ GI: Soft, no masses or hepatosplenomegaly, non-tender to palpation Skin: No rashes, palpation reveals normal turgor Psychiatric: appropriate judgment and insight, oriented to person, place, and time   Lab Results:  Recent Labs    01/22/19 0231 01/23/19 0226  WBC 8.0 8.5  HGB 14.8 14.8  HCT 44.3 45.0  PLT 154 171   BMET Recent Labs    01/22/19 0231 01/23/19 0226  NA 143 143  K 3.1* 4.2  CL 100 102  CO2 30 34*  GLUCOSE 85 98  BUN 7* 14  CREATININE 0.62 0.69  CALCIUM 8.6* 8.7*   PT/INR No results for input(s): LABPROT, INR in the last 72 hours. ABG Recent Labs    01/22/19 0825  PHART 7.445  HCO3 35.9*    Studies/Results: Ct Chest W Contrast  Result Date: 01/21/2019 CLINICAL DATA:  Blunt abdominal trauma. Fall. Left rib cage injury. EXAM: CT CHEST WITH CONTRAST TECHNIQUE: Multidetector CT imaging of the chest was performed during intravenous contrast administration. CONTRAST:  162mL OMNIPAQUE IOHEXOL 300 MG/ML  SOLN COMPARISON:  Chest x-ray from earlier today FINDINGS: Cardiovascular: Normal heart size. No pericardial effusion. No acute vascular finding.  Multifocal atherosclerotic calcification. Mediastinum/Nodes: Negative for adenopathy or mass. Lungs/Pleura: No hemothorax, pneumothorax, or lung contusion. Scarring at the lung bases greater on the right due to the elevated right diaphragm which is chronic. No incidental pneumonia or failure. Upper Abdomen: Colonic interposition between the diaphragm and liver. No acute finding. Musculoskeletal: Advanced scoliosis with bilateral rib cage deformity. Acute left anterolateral 5th to 8th rib fractures with adjacent chest wall swelling. There are remote lower posterior left rib fractures. IMPRESSION: 1. Left fifth through eighth rib fractures without hemothorax or pneumothorax. 2. Scoliosis with chronic low volume chest and asymmetric right diaphragmatic elevation. 3. Aortic Atherosclerosis (ICD10-I70.0). Electronically Signed   By: Monte Fantasia M.D.   On: 01/21/2019 08:58     Assessment/Plan: Left 5th - 8th rib fractures - No PTX, HPTX. Pulm toliet, IS, flutter valve, scheduled duonebs, PT/OT.  wean O2 as tol   LOS: 2 days    Ralene Ok 01/23/2019

## 2019-01-23 NOTE — Progress Notes (Signed)
MEWS/VS Documentation      01/23/2019 0330 01/23/2019 0345 01/23/2019 0417 01/23/2019 0700   MEWS Score:  2  1  1  2    MEWS Score Color:  Yellow  Green  Green  Yellow   Resp:  13  13  -  -   Pulse:  (!) 103  85  -  -   BP:  115/81  -  -  -   Temp:  -  -  98.6 F (37 C)  -   O2 Device:  Nasal Cannula  -  -  -   O2 Flow Rate (L/min):  1 L/min  -  -  -    Patient is laying in bed with eyes closed.  Responsive to verbal stimuli.  Appears calm and in no apparent distress.

## 2019-01-23 NOTE — TOC Transition Note (Signed)
Transition of Care [facility]) - CM/SW Discharge Note   Patient Details  Name: Joann Ford MRN: [id number] Date of Birth: 1945-10-25  Transition of Care [facility]) CM/SW Contact:  Carles Collet, RN Phone Number: 01/23/2019, 1:41 PM   Clinical Narrative:    Huey Romans to deliver portable oxygen tank to room by 5pm. At 1L it will last her 8 hours per rep. They will set up concentrator at home later today. [place] services set up w Alvis Lemmings.  Family at bedside to provide transport.     Final next level of care: [place] Barriers to Discharge: No Barriers Identified   Patient Goals and CMS Choice Patient states their goals for this hospitalization and ongoing recovery are:: to go home CMS Medicare.gov Compare Post Acute Care list provided to:: Patient Choice offered to / list presented to : Patient  Discharge Placement                       Discharge Plan and Services                DME Arranged: Oxygen DME Agency: [place] Date DME Agency Contacted: 01/23/19 Time DME Agency Contacted: [id number] Representative spoke with at DME Agency: [place]: RN, PT, OT [facility] Agency: [place] Date [place]: 01/23/19 Time [place]: 1341 Representative spoke with at [place]: cory  Social Determinants of Health ([place]) Interventions     Readmission Risk Interventions No flowsheet data found.

## 2019-01-23 NOTE — Progress Notes (Signed)
Staff ambulated with patient past nurse's station and back with 1 Liter O2 via nasal cannula.  Patient maintained an O2 sat of 88-94% during ambulation with O2 on @ 1L.  Patient de-satted to 84% without O2.

## 2019-01-25 ENCOUNTER — Telehealth: Payer: Self-pay | Admitting: Internal Medicine

## 2019-01-25 MED ORDER — METOPROLOL TARTRATE 25 MG PO TABS: 25.0000 mg | ORAL_TABLET | Freq: Two times a day (BID) | ORAL | 1 refills | Status: DC

## 2019-01-25 NOTE — Telephone Encounter (Signed)
Patient started on Metoprolol 12.5 BID in hospital and HR around 100 bpm. Plan to discharge with 25 mg BID. Patient already on Diltiazem for Afib. On review of discharge patient did not have prescription on DC. Called patient, sent prescription, and patient will have her husband pick up medication today.

## 2019-02-14 ENCOUNTER — Encounter: Payer: Self-pay | Admitting: Internal Medicine

## 2019-02-14 ENCOUNTER — Ambulatory Visit (INDEPENDENT_AMBULATORY_CARE_PROVIDER_SITE_OTHER): Payer: Medicare HMO | Admitting: Internal Medicine

## 2019-02-14 ENCOUNTER — Other Ambulatory Visit: Payer: Self-pay

## 2019-02-14 VITALS — BP 90/50 | HR 75 | Ht 64.0 in | Wt 151.2 lb

## 2019-02-14 DIAGNOSIS — R0902 Hypoxemia: Secondary | ICD-10-CM

## 2019-02-14 DIAGNOSIS — I5022 Chronic systolic (congestive) heart failure: Secondary | ICD-10-CM

## 2019-02-14 DIAGNOSIS — J438 Other emphysema: Secondary | ICD-10-CM | POA: Diagnosis not present

## 2019-02-14 DIAGNOSIS — I4819 Other persistent atrial fibrillation: Secondary | ICD-10-CM

## 2019-02-14 MED ORDER — DILTIAZEM HCL ER COATED BEADS 120 MG PO TB24: 120.0000 mg | ORAL_TABLET | Freq: Every day | ORAL | 3 refills | Status: DC

## 2019-02-14 NOTE — Patient Instructions (Signed)
Medication Instructions:  DECREASE diltiazem from 240mg  to 120mg  - new prescription sent to pharmacy  *If you need a refill on your cardiac medications before your next appointment, please call your pharmacy*   Follow-Up: At [facility], you and your health needs are our priority.  As part of our continuing mission to provide you with exceptional heart care, we have created designated Provider Care Teams.  These Care Teams include your primary Cardiologist (physician) and Advanced Practice Providers (APPs -  Physician Assistants and Nurse Practitioners) who all work together to provide you with the care you need, when you need it.  Your next appointment:   6 month(s)  The format for your next appointment:   Either In Person or Virtual  Provider:   You may see Pixie Casino, MD or one of the following Advanced Practice Providers on your designated Care Team:    Almyra Deforest, PA-C  Fabian Sharp, PA-C or   Roby Lofts, [place]   Other Instructions You have been referred to [facility] Pulmonary - Dr. Lamonte Sakai

## 2019-02-14 NOTE — Progress Notes (Signed)
OFFICE CONSULT NOTE  Chief Complaint:  Hospital follow-up  Primary Care Physician: Ardith Dark, PA-C  HPI:  Joann Ford is a 73 y.o. female who is being seen today for the evaluation of shortness of breath and preoperative clearance at the request of Ardith Dark, [place]. This is a 73 year old female with a 40-pack-year smoking history, who continues to smoke, history of dyslipidemia, anxiety, arthritis and osteoporosis with marked thoracic kyphosis.  Recently she was evaluated for possible spine straightening surgery.  Due to insurance reason she canceled any further surgery options and recently was seen for preoperative clearance and management of COPD by Dr. Lamonte Sakai.  He felt that she was acceptable risk for surgery, but she is being seen now for cardiovascular risk assessment.  She had a CT scan of the thoracic and lumbar spine in November 2018.  This showed significant coronary artery calcification and aortic atherosclerosis.  She has history of dyslipidemia but is not currently on a statin medication.  Symptomatically, she gets short of breath with minimal exertion.  She is not able to do a lot of activity due to her back problem.  There is no significant family history of heart disease in her first-degree relatives.  Does report a history of heart murmur.  07/21/2017  [name] returns today for follow-up.  She underwent Lexiscan Myoview as well as echocardiogram for evaluation of murmur and preoperative risk assessment for possible spinal surgery.  Her echocardiogram demonstrated normal LVEF of 55 to 60% however there is very mild stenosis of the aortic valve.  Her Myoview stress test was low risk however there was a very small area of apical inferior and apical lateral ischemia.  Since she has been asymptomatic, I would recommend treating this medically.  Overall she is acceptable risk for back surgery should she choose to go through that.  We did also perform blood work  including a lipid profile.  This demonstrated an LDL of 123.  She was then started on atorvastatin 40 mg daily.  Unfortunately, she says she was intolerant of it after only 2 days on the medicine.  She said she developed severe joint pains that did get better after stopping the medicine.  This does not seem typical of SAMS.  04/22/2018  [name] is seen today in follow-up.  She has recently been seen by Almyra Deforest, PA-C for persistent atrial fibrillation.  In December she was hospitalized with dyspnea and found to have an EF of 40 to 45% on echo with A. fib and RVR.  She underwent TEE guided cardioversion with multiple shocks that was not found to be effective.  She says she remains asymptomatic with her A. fib.  Heart rates in the low today in the 40s and is irregularly irregular.  She is on higher dose diltiazem.  Is not clear if she is symptomatic though with his heart rate.  He has not yet decided whether she wanted to have surgery to correct her significant kyphosis.  Did stop her statin medication and her LDL accordingly has gone up.  I suspect that she had myalgias to atorvastatin.  Her most recent lipid profile showed an LDL cholesterol 123.  Her goal LDL is less than 100 at least or perhaps even less than 70.  11/15/2018  Joann Ford is seen today in follow-up.  Unfortunately she was not able to be operated on with regards to spine straightening after speaking with multiple specialist.  When I last saw her we started on low-dose  Crestor 5 mg every other day.  She thinks that is probably as much as she can tolerate due to side effects.  Blood pressure is actually low today 102/66.  She denies any chest pain or shortness of breath.  She has no positional dizziness.  She denies any recurrent symptomatic palpitations but remains in A. fib today which is rate controlled.  01/07/2019  Joann Ford was seen today in follow-up.  I just saw her back in September.  Overall she seemed to be doing pretty well however  over the past month or so she has had some increased lower extremity swelling, particular in the left lower extremity.  Today she is seen as an acute visit for that.  She developed some weeping edema at home.  She has significant bilateral venous insufficiency with varicose veins, hemosiderin deposition and poorly healing wounds.  There is some asymmetric swelling of the left lower extremity.  She is however anticoagulated on Eliquis because of her A. fib and therefore the likelihood of DVT is extremely low.  I suspect that she probably has significant venous reflux and the fact that she sits most of the day with her leg down due to orthopedic problems with her spine, I think that is contributing to her swelling.     02/14/2019  Joann Ford returns today for follow-up.  She was just admitted in November and discharged with a diagnosis of acute hypoxic respiratory failure with associated left fifth through eighth rib fractures as well as A. fib with RVR.  Metoprolol was added to her medicine regimen however today she is noted to be hypotensive with blood pressure 90/50.  She is also hypoxic here with an oxygen saturation of 87%.  At home she says her oxygen saturation bounces around it does not sound like she wears home oxygen all that often.  Was supposed to be referred to pulmonary however does not have an upcoming appointment.  She does report the recent increase in her diuretics help with her lower extremity edema.  PMHx:  Past Medical History:  Diagnosis Date  . Anxiety   . Arthritis   . Chronic pain   . Hyperlipidemia   . Osteoporosis     Past Surgical History:  Procedure Laterality Date  . CARDIOVERSION N/A 02/08/2018   Procedure: CARDIOVERSION;  Surgeon: Pixie Casino, MD;  Location: [facility] ENDOSCOPY;  Service: Cardiovascular;  Laterality: N/A;  . I and D of boil  1998, 1999   bilateral axilla  . TEE WITHOUT CARDIOVERSION N/A 02/08/2018   Procedure: TRANSESOPHAGEAL ECHOCARDIOGRAM (TEE);   Surgeon: Pixie Casino, MD;  Location: [facility] ENDOSCOPY;  Service: Cardiovascular;  Laterality: N/A;    FAMHx:  Family History  Problem Relation Age of Onset  . Cancer Father        Jaw Cancer  . Pneumonia Mother   . Cancer Cousin        jaw  . Colon cancer Neg Hx   . Stomach cancer Neg Hx     SOCHx:   reports that she has quit smoking. Her smoking use included cigarettes. She started smoking about 53 years ago. She has a 40.00 pack-year smoking history. She has never used smokeless tobacco. She reports that she does not drink alcohol or use drugs.  ALLERGIES:  Allergies  Allergen Reactions  . Cefadroxil Anaphylaxis, Swelling and Other (See Comments)    Throat closes  . Ciprofloxacin Anaphylaxis and Other (See Comments)    Throat closes  . Penicillins Anaphylaxis and Swelling  Did it involve swelling of the face/tongue/throat, SOB, or low BP? Yes Did it involve sudden or severe rash/hives, skin peeling, or any reaction on the inside of your mouth or nose? No Did you need to seek medical attention at a hospital or doctor's office? Yes When did it last happen?"10-15 years ago" If all above answers are "NO", may proceed with cephalosporin use.   . Atorvastatin Other (See Comments)    Possible SAMS - 40 mg dose    ROS: Pertinent items noted in HPI and remainder of comprehensive ROS otherwise negative.  HOME MEDS: Current Outpatient Medications on File Prior to Visit  Medication Sig Dispense Refill  . acetaminophen (TYLENOL) 500 MG tablet Take 1,000 mg by mouth every 6 (six) hours as needed (for pain).     Marland Kitchen albuterol (PROVENTIL) (2.5 MG/3ML) 0.083% nebulizer solution Take 3 mLs (2.5 mg total) by nebulization every 6 (six) hours as needed for wheezing or shortness of breath. 300 mL 5  . albuterol (VENTOLIN HFA) 108 (90 Base) MCG/ACT inhaler Inhale 2 puffs into the lungs every 6 (six) hours as needed for wheezing or shortness of breath.    Marland Kitchen apixaban (ELIQUIS) 5 MG TABS  tablet Take 1 tablet (5 mg total) by mouth 2 (two) times daily. 60 tablet 5  . diltiazem (CARDIZEM CD) 240 MG 24 hr capsule Take 1 capsule (240 mg total) by mouth daily. 90 capsule 3  . diphenhydramine-acetaminophen (TYLENOL PM) 25-500 MG TABS tablet Take 2 tablets by mouth at bedtime as needed (for sleep).     Marland Kitchen escitalopram (LEXAPRO) 20 MG tablet Take 1 tablet (20 mg total) by mouth daily. 30 tablet 11  . furosemide (LASIX) 40 MG tablet Take 1 tablet (40 mg total) by mouth daily. 90 tablet 3  . gabapentin (NEURONTIN) 300 MG capsule Take 300-600 mg by mouth See admin instructions. Take 300 mg by mouth in the morning and 600 mg in the evening    . metoprolol tartrate (LOPRESSOR) 25 MG tablet Take 1 tablet (25 mg total) by mouth 2 (two) times daily. 60 tablet 1  . morphine (MS CONTIN) 15 MG 12 hr tablet Take 15 mg by mouth every 12 (twelve) hours.     . rosuvastatin (CRESTOR) 5 MG tablet Take 1 tablet (5 mg total) by mouth every other day. 45 tablet 3   Current Facility-Administered Medications on File Prior to Visit  Medication Dose Route Frequency Provider Last Rate Last Admin  . 0.9 %  sodium chloride infusion  500 mL Intravenous Continuous Lafayette Dragon, MD        LABS/IMAGING: No results found for this or any previous visit (from the past 48 hour(s)). No results found.  LIPID PANEL:    Component Value Date/Time   CHOL 131 11/16/2018 0944   TRIG 73 11/16/2018 0944   HDL 47 11/16/2018 0944   CHOLHDL 2.8 11/16/2018 0944   CHOLHDL 3 09/29/2011 1120   VLDL 18.2 09/29/2011 1120   LDLCALC 69 11/16/2018 0944   LDLDIRECT 156.9 02/07/2008 1407    WEIGHTS: Wt Readings from Last 3 Encounters:  02/14/19 151 lb 3.2 oz (68.6 kg)  01/21/19 152 lb 8.9 oz (69.2 kg)  01/07/19 152 lb 9.6 oz (69.2 kg)    VITALS: BP (!) 90/50   Pulse 75   Ht 5\' 4"  (1.626 m)   Wt 151 lb 3.2 oz (68.6 kg)   SpO2 (!) 87%   BMI 25.95 kg/m   EXAM: General appearance: alert, no distress  and Marked thoracic  kyphosis Neck: no carotid bruit, no JVD and thyroid not enlarged, symmetric, no tenderness/mass/nodules Lungs: clear to auscultation bilaterally Heart: irregularly irregular rhythm Abdomen: soft, non-tender; bowel sounds normal; no masses,  no organomegaly Extremities: edema 1+ left lower extremity and Venous stasis changes with superficial varicose veins, hemosiderin deposition Pulses: 2+ and symmetric Skin: Skin color, texture, turgor normal. No rashes or lesions Neurologic: Grossly normal Psych: Pleasant  EKG: Deferred  ASSESSMENT: 1. Low risk Myoview stress test with a small area of inferolateral reversible ischemia, LVEF 61% (06/2017) 2. LVEF 40 to 45% by echo in the setting of A. fib with RVR 3. Persistent A. fib (failed cardioversion x3, 01/2018) 4. COPD 5. 40-pack-year smoker-ongoing 6. Mild aortic stenosis (06/2017) 7. Dyslipidemia - intolerant to atorvastatin 8. Coronary artery calcification  PLAN: 1.   Mrs. Pyatt was just hospitalized with A. fib for RVR and acute hypoxic respiratory failure.  She was discharged on oxygen however is not wearing it today.  Her oxygen saturation was 87%.  I encouraged her to use it frequently.  She is hypotensive today.  This may have been related to the addition of metoprolol to her diltiazem.  Heart rate is controlled in the 70s today.  I would recommend decreasing her diltiazem to 120 mg daily and continuing metoprolol.  We will refer her to pulmonary for further follow-up and ongoing oxygen therapy.  She is previously seen Dr. Lamonte Sakai.  Follow-up with me in 6 months or sooner as necessary.  Pixie Casino, MD, [facility], [place] Director of the Advanced Lipid Disorders &  Cardiovascular Risk Reduction Clinic Diplomate of the American Board of Clinical Lipidology Attending Cardiologist  Direct Dial: [phone]  Fax: [phone]  Website:  [email or web address] 02/14/2019, 2:58 PM

## 2019-02-17 ENCOUNTER — Other Ambulatory Visit: Payer: Self-pay

## 2019-03-04 ENCOUNTER — Other Ambulatory Visit: Payer: Self-pay | Admitting: Internal Medicine

## 2019-03-15 ENCOUNTER — Encounter: Payer: Self-pay | Admitting: Emergency Medicine

## 2019-03-15 ENCOUNTER — Other Ambulatory Visit: Payer: Self-pay

## 2019-03-15 ENCOUNTER — Ambulatory Visit: Payer: Medicare HMO | Admitting: Emergency Medicine

## 2019-03-15 DIAGNOSIS — J9601 Acute respiratory failure with hypoxia: Secondary | ICD-10-CM

## 2019-03-15 DIAGNOSIS — J449 Chronic obstructive pulmonary disease, unspecified: Secondary | ICD-10-CM | POA: Diagnosis not present

## 2019-03-15 DIAGNOSIS — Z87891 Personal history of nicotine dependence: Secondary | ICD-10-CM | POA: Diagnosis not present

## 2019-03-15 MED ORDER — STIOLTO RESPIMAT 2.5-2.5 MCG/ACT IN AERS: 2.0000 | INHALATION_SPRAY | Freq: Every day | RESPIRATORY_TRACT | 0 refills | Status: AC

## 2019-03-15 NOTE — Assessment & Plan Note (Signed)
Multifactorial acute respiratory failure on recent hospitalization-restrictive disease from kyphoscoliosis, atrial fibrillation, superimposed COPD.  She was sent home on supplemental oxygen.  We will repeat her walking oximetry today, confirm that she continues to require and give her instructions on how to use.

## 2019-03-15 NOTE — Progress Notes (Signed)
Subjective:    Patient ID: Joann Ford, female    DOB: 1945/05/06, 74 y.o.   MRN: [id number]  HPI Ms. [place] is a 74 year old smoker (40 pack years) with a history of significant thoracic scoliosis and kyphosis, atrial fibrillation, hyperlipidemia, mixed obstruction and restriction by pulmonary function testing that were done 05/18/2017. I last saw her at that time and did a trial of Spiriva Respimat - she cannot recall whether it helped her.   She was admitted in November 2020 with left-sided rib fractures, atrial fibrillation/RVR and associated hypoxemia.  She was discharged on supplemental oxygen - she isn't sure how she was supposed to use it. Her ribs are improved, less pain, now better able to take a deep breath.  She is smoking 3-4 cig a day. She is interested in stopping but does not have a plan.    Review of Systems  Constitutional: Negative for fever and unexpected weight change.  HENT: Positive for nosebleeds and rhinorrhea. Negative for congestion, dental problem, ear pain, postnasal drip, sinus pressure, sneezing, sore throat and trouble swallowing.   Eyes: Negative for redness and itching.  Respiratory: Positive for shortness of breath and wheezing. Negative for cough and chest tightness.   Cardiovascular: Positive for palpitations and leg swelling.  Gastrointestinal: Positive for nausea. Negative for vomiting.  Genitourinary: Negative for dysuria.  Musculoskeletal: Negative for joint swelling.  Skin: Negative for rash.  Allergic/Immunologic: Negative.  Negative for environmental allergies, food allergies and immunocompromised state.  Neurological: Negative for headaches.  Hematological: Bruises/bleeds easily.  Psychiatric/Behavioral: Positive for dysphoric mood. The patient is not nervous/anxious.    Past Medical History:  Diagnosis Date  . Anxiety   . Arthritis   . Atrial fibrillation ([place])   . Chronic pain   . Hyperlipidemia   . Osteoporosis      Family  History  Problem Relation Age of Onset  . Cancer Father        Jaw Cancer  . Pneumonia Mother   . Cancer Cousin        jaw  . Colon cancer Neg Hx   . Stomach cancer Neg Hx      Social History   Socioeconomic History  . Marital status: Married    Spouse name: Not on file  . Number of children: Not on file  . Years of education: Not on file  . Highest education level: Not on file  Occupational History  . Occupation: hair dresser  Tobacco Use  . Smoking status: Current Every Day Smoker    Packs/day: 0.20    Years: 40.00    Pack years: 8.00    Types: Cigarettes    Start date: 03/15/1965  . Smokeless tobacco: Never Used  Substance and Sexual Activity  . Alcohol use: No  . Drug use: No  . Sexual activity: Not on file  Other Topics Concern  . Not on file  Social History Narrative   Exercise--  walk   Social Determinants of Health   Financial Resource Strain:   . Difficulty of Paying Living Expenses: Not on file  Food Insecurity:   . Worried About Charity fundraiser in the Last Year: Not on file  . Ran Out of Food in the Last Year: Not on file  Transportation Needs:   . Lack of Transportation (Medical): Not on file  . Lack of Transportation (Non-Medical): Not on file  Physical Activity:   . Days of Exercise per Week: Not on file  . Minutes  of Exercise per Session: Not on file  Stress:   . Feeling of Stress : Not on file  Social Connections:   . Frequency of Communication with Friends and Family: Not on file  . Frequency of Social Gatherings with Friends and Family: Not on file  . Attends Religious Services: Not on file  . Active Member of Clubs or Organizations: Not on file  . Attends Archivist Meetings: Not on file  . Marital Status: Not on file  Intimate Partner Violence:   . Fear of Current or Ex-Partner: Not on file  . Emotionally Abused: Not on file  . Physically Abused: Not on file  . Sexually Abused: Not on file     Allergies  Allergen  Reactions  . Cefadroxil Anaphylaxis, Swelling and Other (See Comments)    Throat closes  . Ciprofloxacin Anaphylaxis and Other (See Comments)    Throat closes  . Penicillins Anaphylaxis and Swelling    Did it involve swelling of the face/tongue/throat, SOB, or low BP? Yes Did it involve sudden or severe rash/hives, skin peeling, or any reaction on the inside of your mouth or nose? No Did you need to seek medical attention at a hospital or doctor's office? Yes When did it last happen?"10-15 years ago" If all above answers are "NO", may proceed with cephalosporin use.   . Atorvastatin Other (See Comments)    Possible SAMS - 40 mg dose     Outpatient Medications Prior to Visit  Medication Sig Dispense Refill  . acetaminophen (TYLENOL) 500 MG tablet Take 1,000 mg by mouth every 6 (six) hours as needed (for pain).     Marland Kitchen albuterol (PROVENTIL) (2.5 MG/3ML) 0.083% nebulizer solution Take 3 mLs (2.5 mg total) by nebulization every 6 (six) hours as needed for wheezing or shortness of breath. 300 mL 5  . albuterol (VENTOLIN HFA) 108 (90 Base) MCG/ACT inhaler Inhale 2 puffs into the lungs every 6 (six) hours as needed for wheezing or shortness of breath.    Marland Kitchen apixaban (ELIQUIS) 5 MG TABS tablet Take 1 tablet (5 mg total) by mouth 2 (two) times daily. 60 tablet 5  . diltiazem (CARDIZEM CD) 120 MG 24 hr capsule Take 1 capsule (120 mg total) by mouth daily. 90 capsule 3  . diltiazem (CARDIZEM LA) 120 MG 24 hr tablet Take 1 tablet (120 mg total) by mouth daily. 90 tablet 3  . diphenhydramine-acetaminophen (TYLENOL PM) 25-500 MG TABS tablet Take 2 tablets by mouth at bedtime as needed (for sleep).     Marland Kitchen escitalopram (LEXAPRO) 20 MG tablet Take 1 tablet (20 mg total) by mouth daily. 30 tablet 11  . furosemide (LASIX) 40 MG tablet TAKE 1 TABLET EVERY DAY 90 tablet 3  . gabapentin (NEURONTIN) 300 MG capsule Take 300-600 mg by mouth See admin instructions. Take 300 mg by mouth in the morning and 600 mg in the  evening    . metoprolol tartrate (LOPRESSOR) 25 MG tablet Take 1 tablet (25 mg total) by mouth 2 (two) times daily. 60 tablet 1  . morphine (MS CONTIN) 15 MG 12 hr tablet Take 15 mg by mouth every 12 (twelve) hours.     . rosuvastatin (CRESTOR) 5 MG tablet Take 1 tablet (5 mg total) by mouth every other day. 45 tablet 3   Facility-Administered Medications Prior to Visit  Medication Dose Route Frequency Provider Last Rate Last Admin  . 0.9 %  sodium chloride infusion  500 mL Intravenous Continuous Lafayette Dragon,  MD            Objective:   Physical Exam Vitals:   03/15/19 1459  BP: 134/64  Pulse: 84  Temp: 98.1 F (36.7 C)  TempSrc: Temporal  SpO2: 91%  Weight: 155 lb 6.4 oz (70.5 kg)  Height: 5\' 9"  (1.753 m)   Gen: Pleasant, severely kyphotic, in no distress,  normal affect, wheelchair  ENT: No lesions,  mouth clear,  oropharynx clear, no postnasal drip  Neck: No JVD, no stridor  Lungs: No use of accessory muscles, no crackles or wheezing on normal respiration, no wheeze on forced expiration  Cardiovascular: RRR, heart sounds normal, no murmur or gallops, no peripheral edema  Musculoskeletal: No deformities, no cyanosis or clubbing  Neuro: alert, awake, non focal  Skin: Warm, no lesions or rash     Assessment & Plan:  COPD (chronic obstructive pulmonary disease) (HCC) Significant restriction due to her kyphoscoliosis but suspect there is a component of obstruction as well.  We did a trial of Spiriva Respimat last time but she is unsure whether it was beneficial.  I think will be reasonable to retry scheduled bronchodilators, trial of Stiolto to see if she gets benefit.  I will follow up with her sooner so that we can discuss.  Acute respiratory failure with hypoxia (HCC) Multifactorial acute respiratory failure on recent hospitalization-restrictive disease from kyphoscoliosis, atrial fibrillation, superimposed COPD.  She was sent home on supplemental oxygen.  We will  repeat her walking oximetry today, confirm that she continues to require and give her instructions on how to use.  Personal history of tobacco use, presenting hazards to health Continues to smoke 3 to 4 cigarettes daily.  She is thinking about stopping but does not have a plan.  Not clear to me that she is highly motivated to decrease at this time.  She states that she does not want any assistance right now.  I have asked her to continue to consider and have encouraged her that she can succeed since she has stopped before.  I will continue to follow with her.  She will call me if she is ready to set a quit date so that we can assist.   Baltazar Apo, MD, PhD 03/15/2019, 3:38 PM [facility] [phone] or if no answer [phone]

## 2019-03-15 NOTE — Patient Instructions (Addendum)
We will try starting Stiolto 2 puffs once daily to see if this helps your breathing.  If so we will continue it going forward. Keep your albuterol available to use 2 puffs if needed for shortness of breath, chest tightness, wheezing. Walking oximetry today shows that your oxygen level still drops with exertion.  You will need to wear 2 L/min whenever you are up walking or exerting yourself. Please continue to work on decreasing your smoking.  Our ultimate goal will be for you to stop altogether.  Call and let us know if you are ready to stop and we will try to help you succeed. Follow with Dr. Lamonte Sakai in 1 to 2 months to assess your status on the new inhaler.

## 2019-03-15 NOTE — Assessment & Plan Note (Signed)
Continues to smoke 3 to 4 cigarettes daily.  She is thinking about stopping but does not have a plan.  Not clear to me that she is highly motivated to decrease at this time.  She states that she does not want any assistance right now.  I have asked her to continue to consider and have encouraged her that she can succeed since she has stopped before.  I will continue to follow with her.  She will call me if she is ready to set a quit date so that we can assist.

## 2019-03-15 NOTE — Assessment & Plan Note (Signed)
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  We did a trial of Spiriva Respimat last time but she is unsure whether it was beneficial.  I think will be reasonable to retry scheduled bronchodilators, trial of Stiolto to see if she gets benefit.  I will follow up with her sooner so that we can discuss.

## 2019-04-15 ENCOUNTER — Ambulatory Visit: Payer: Medicare HMO | Admitting: Emergency Medicine

## 2019-04-20 ENCOUNTER — Ambulatory Visit: Payer: Medicare HMO | Admitting: Emergency Medicine

## 2019-05-02 ENCOUNTER — Encounter: Payer: Self-pay | Admitting: Emergency Medicine

## 2019-05-02 ENCOUNTER — Other Ambulatory Visit: Payer: Self-pay

## 2019-05-02 ENCOUNTER — Ambulatory Visit: Payer: Medicare HMO | Admitting: Emergency Medicine

## 2019-05-02 DIAGNOSIS — J9611 Chronic respiratory failure with hypoxia: Secondary | ICD-10-CM

## 2019-05-02 DIAGNOSIS — J449 Chronic obstructive pulmonary disease, unspecified: Secondary | ICD-10-CM

## 2019-05-02 MED ORDER — TIOTROPIUM BROMIDE-OLODATEROL 2.5-2.5 MCG/ACT IN AERS: 2.0000 | INHALATION_SPRAY | Freq: Every day | RESPIRATORY_TRACT | 11 refills | Status: DC

## 2019-05-02 NOTE — Patient Instructions (Signed)
We will plan to continue Stiolto 2 puffs once daily.  We will send prescription for this to your pharmacy. Keep albuterol available to use 2 puffs if needed for shortness of breath, chest tightness, wheezing. We will work on obtaining a portable oxygen concentrator so that you can more easily wear your oxygen when you are exerting yourself. Continue to work hard on quitting smoking. Follow with Dr Lamonte Sakai in 6 months or sooner if you have any problems

## 2019-05-02 NOTE — Progress Notes (Signed)
Subjective:    Patient ID: Joann Ford, female    DOB: [birth year], 74 y.o.   MRN: [id number]  HPI Ms. Bertone is a 74 year old smoker (40 pack years) with a history of significant thoracic scoliosis and kyphosis, atrial fibrillation, hyperlipidemia, mixed obstruction and restriction by pulmonary function testing that were done 05/18/2017. I last saw her at that time and did a trial of Spiriva Respimat - she cannot recall whether it helped her.   She was admitted in November 2020 with left-sided rib fractures, atrial fibrillation/RVR and associated hypoxemia.  She was discharged on supplemental oxygen - she isn't sure how she was supposed to use it. Her ribs are improved, less pain, now better able to take a deep breath.  She is smoking 3-4 cig a day. She is interested in stopping but does not have a plan.   ROV 05/02/19 --74 year old active smoker (40 pack years) with restrictive disease due to kyphosis and scoliosis, atrial fibrillation, mixed obstruction and restriction by PFT with presumed COPD.  Also with a history of atrial fibrillation.  At her last visit I started Stiolto to see if she would get benefit.  We also confirmed that she desaturated with ambulation I asked her to continue 2 L/min whenever she was walking or exerting.  She reports today that she may have benefited from the [employer]. She is using albuterol 2x a week. She is contemplating stopping cigarettes, but does not wants meds to help her stop. She is interested in a Marine scientist.    Review of Systems  Constitutional: Negative for fever and unexpected weight change.  HENT: Positive for nosebleeds and rhinorrhea. Negative for congestion, dental problem, ear pain, postnasal drip, sinus pressure, sneezing, sore throat and trouble swallowing.   Eyes: Negative for redness and itching.  Respiratory: Positive for shortness of breath and wheezing. Negative for cough and chest tightness.   Cardiovascular: Positive for  palpitations and leg swelling.  Gastrointestinal: Positive for nausea. Negative for vomiting.  Genitourinary: Negative for dysuria.  Musculoskeletal: Negative for joint swelling.  Skin: Negative for rash.  Allergic/Immunologic: Negative.  Negative for environmental allergies, food allergies and immunocompromised state.  Neurological: Negative for headaches.  Hematological: Bruises/bleeds easily.  Psychiatric/Behavioral: Positive for dysphoric mood. The patient is not nervous/anxious.    Past Medical History:  Diagnosis Date  . Anxiety   . Arthritis   . Atrial fibrillation ([place])   . Chronic pain   . Hyperlipidemia   . Osteoporosis      Family History  Problem Relation Age of Onset  . Cancer Father        Jaw Cancer  . Pneumonia Mother   . Cancer Cousin        jaw  . Colon cancer Neg Hx   . Stomach cancer Neg Hx      Social History   Socioeconomic History  . Marital status: Married    Spouse name: Not on file  . Number of children: Not on file  . Years of education: Not on file  . Highest education level: Not on file  Occupational History  . Occupation: hair dresser  Tobacco Use  . Smoking status: Current Every Day Smoker    Packs/day: 0.20    Years: 40.00    Pack years: 8.00    Types: Cigarettes    Start date: 03/15/1965  . Smokeless tobacco: Never Used  Substance and Sexual Activity  . Alcohol use: No  . Drug use: No  . Sexual activity:  Not on file  Other Topics Concern  . Not on file  Social History Narrative   Exercise--  walk   Social Determinants of Health   Financial Resource Strain:   . Difficulty of Paying Living Expenses: Not on file  Food Insecurity:   . Worried About Charity fundraiser in the Last Year: Not on file  . Ran Out of Food in the Last Year: Not on file  Transportation Needs:   . Lack of Transportation (Medical): Not on file  . Lack of Transportation (Non-Medical): Not on file  Physical Activity:   . Days of Exercise per Week:  Not on file  . Minutes of Exercise per Session: Not on file  Stress:   . Feeling of Stress : Not on file  Social Connections:   . Frequency of Communication with Friends and Family: Not on file  . Frequency of Social Gatherings with Friends and Family: Not on file  . Attends Religious Services: Not on file  . Active Member of Clubs or Organizations: Not on file  . Attends Archivist Meetings: Not on file  . Marital Status: Not on file  Intimate Partner Violence:   . Fear of Current or Ex-Partner: Not on file  . Emotionally Abused: Not on file  . Physically Abused: Not on file  . Sexually Abused: Not on file     Allergies  Allergen Reactions  . Cefadroxil Anaphylaxis, Swelling and Other (See Comments)    Throat closes  . Ciprofloxacin Anaphylaxis and Other (See Comments)    Throat closes  . Penicillins Anaphylaxis and Swelling    Did it involve swelling of the face/tongue/throat, SOB, or low BP? Yes Did it involve sudden or severe rash/hives, skin peeling, or any reaction on the inside of your mouth or nose? No Did you need to seek medical attention at a hospital or doctor's office? Yes When did it last happen?"10-15 years ago" If all above answers are "NO", may proceed with cephalosporin use.   . Atorvastatin Other (See Comments)    Possible SAMS - 40 mg dose     Outpatient Medications Prior to Visit  Medication Sig Dispense Refill  . acetaminophen (TYLENOL) 500 MG tablet Take 1,000 mg by mouth every 6 (six) hours as needed (for pain).     Marland Kitchen albuterol (PROVENTIL) (2.5 MG/3ML) 0.083% nebulizer solution Take 3 mLs (2.5 mg total) by nebulization every 6 (six) hours as needed for wheezing or shortness of breath. 300 mL 5  . albuterol (VENTOLIN HFA) 108 (90 Base) MCG/ACT inhaler Inhale 2 puffs into the lungs every 6 (six) hours as needed for wheezing or shortness of breath.    Marland Kitchen apixaban (ELIQUIS) 5 MG TABS tablet Take 1 tablet (5 mg total) by mouth 2 (two) times daily.  60 tablet 5  . diltiazem (CARDIZEM CD) 120 MG 24 hr capsule Take 1 capsule (120 mg total) by mouth daily. 90 capsule 3  . diltiazem (CARDIZEM LA) 120 MG 24 hr tablet Take 1 tablet (120 mg total) by mouth daily. 90 tablet 3  . diphenhydramine-acetaminophen (TYLENOL PM) 25-500 MG TABS tablet Take 2 tablets by mouth at bedtime as needed (for sleep).     Marland Kitchen escitalopram (LEXAPRO) 20 MG tablet Take 1 tablet (20 mg total) by mouth daily. 30 tablet 11  . furosemide (LASIX) 40 MG tablet TAKE 1 TABLET EVERY DAY 90 tablet 3  . gabapentin (NEURONTIN) 300 MG capsule Take 300-600 mg by mouth See admin instructions. Take  300 mg by mouth in the morning and 600 mg in the evening    . metoprolol tartrate (LOPRESSOR) 25 MG tablet Take 1 tablet (25 mg total) by mouth 2 (two) times daily. 60 tablet 1  . morphine (MS CONTIN) 15 MG 12 hr tablet Take 15 mg by mouth every 12 (twelve) hours.     . Tiotropium Bromide-Olodaterol (STIOLTO RESPIMAT) 2.5-2.5 MCG/ACT AERS Inhale 2 puffs into the lungs daily. 4 g 0  . rosuvastatin (CRESTOR) 5 MG tablet Take 1 tablet (5 mg total) by mouth every other day. 45 tablet 3   Facility-Administered Medications Prior to Visit  Medication Dose Route Frequency Provider Last Rate Last Admin  . 0.9 %  sodium chloride infusion  500 mL Intravenous Continuous Lafayette Dragon, MD            Objective:   Physical Exam Vitals:   05/02/19 1632  BP: 90/62  Pulse: 82  Temp: 98 F (36.7 C)  TempSrc: Temporal  SpO2: 90%  Weight: 142 lb (64.4 kg)  Height: 5\' 9"  (1.753 m)   Gen: Pleasant, severely kyphotic, in no distress,  normal affect, a bit stoic  ENT: No lesions,  mouth clear,  oropharynx clear, no postnasal drip  Neck: No JVD, no stridor  Lungs: No use of accessory muscles, no crackles or wheezing on normal respiration, no wheeze on forced expiration  Cardiovascular: RRR, heart sounds normal, no murmur or gallops, no peripheral edema  Musculoskeletal: No deformities, no cyanosis  or clubbing  Neuro: alert, awake, non focal  Skin: Warm, no lesions or rash     Assessment & Plan:  COPD (chronic obstructive pulmonary disease) ([place]) She minimizes symptoms but she appears to have benefited from the [employer].  Her husband agrees when history was taken from him.    We will plan to continue Stiolto 2 puffs once daily.  We will send prescription for this to your pharmacy. Keep albuterol available to use 2 puffs if needed for shortness of breath, chest tightness, wheezing. We will work on obtaining a portable oxygen concentrator so that you can more easily wear your oxygen when you are exerting yourself. Continue to work hard on quitting smoking. Follow with Dr Lamonte Sakai in 6 months or sooner if you have any problems  Chronic respiratory failure with hypoxia [facility]) She had documented exertional desaturation last time she was here.  She has not been wearing her oxygen reliably and does not have it with her today.  One reason is that it is too heavy for her to carry.  She does have severe kyphosis and scoliosis and needs the lightest system possible.  I will try to arrange for a portable oxygen concentrator.  We will walk her today to see what her pulsed oxygen requirement is and then order for her.   Baltazar Apo, MD, PhD 05/02/2019, 5:01 PM [facility] [phone] or if no answer [phone]

## 2019-05-02 NOTE — Assessment & Plan Note (Signed)
She minimizes symptoms but she appears to have benefited from the [employer].  Her husband agrees when history was taken from him.    We will plan to continue Stiolto 2 puffs once daily.  We will send prescription for this to your pharmacy. Keep albuterol available to use 2 puffs if needed for shortness of breath, chest tightness, wheezing. We will work on obtaining a portable oxygen concentrator so that you can more easily wear your oxygen when you are exerting yourself. Continue to work hard on quitting smoking. Follow with Dr Lamonte Sakai in 6 months or sooner if you have any problems

## 2019-05-02 NOTE — Assessment & Plan Note (Signed)
She had documented exertional desaturation last time she was here.  She has not been wearing her oxygen reliably and does not have it with her today.  One reason is that it is too heavy for her to carry.  She does have severe kyphosis and scoliosis and needs the lightest system possible.  I will try to arrange for a portable oxygen concentrator.  We will walk her today to see what her pulsed oxygen requirement is and then order for her.

## 2019-05-03 ENCOUNTER — Telehealth: Payer: Self-pay | Admitting: Emergency Medicine

## 2019-05-03 DIAGNOSIS — J449 Chronic obstructive pulmonary disease, unspecified: Secondary | ICD-10-CM

## 2019-05-03 NOTE — Telephone Encounter (Signed)
Once new order is signed, I will send to [place].  Are you able to update the [facility] note to reflect the correct DME for pt?  Thank you!

## 2019-05-03 NOTE — Telephone Encounter (Signed)
Spoke with Joann Ford, she states she will not be able to give pt a POC because they don't have any in stock and then she was never serviced with [facility]. I called the number the patient gave me and its [facility]. I will place another order to have the PCC's send the order to [facility]. Order placed.     Joann Ford [phone]

## 2019-05-03 NOTE — Telephone Encounter (Signed)
Already noted on the [facility] note. Nothing further needed.

## 2019-05-04 ENCOUNTER — Other Ambulatory Visit: Payer: Self-pay | Admitting: Internal Medicine

## 2019-05-04 DIAGNOSIS — E785 Hyperlipidemia, unspecified: Secondary | ICD-10-CM

## 2019-05-05 NOTE — Addendum Note (Signed)
Addended by: Jannette Spanner on: 05/05/2019 09:27 AM   Modules accepted: Orders

## 2019-05-05 NOTE — Telephone Encounter (Signed)
I will have TP sign as APP of the day since RB hasn't signed order yet.

## 2019-06-07 ENCOUNTER — Emergency Department (HOSPITAL_BASED_OUTPATIENT_CLINIC_OR_DEPARTMENT_OTHER)
Admission: EM | Admit: 2019-06-07 | Discharge: 2019-06-07 | Disposition: A | Discharge status: Home / Self Care | Payer: Medicare HMO | Source: Home / Self Care | Attending: Emergency Medicine | Admitting: Emergency Medicine

## 2019-06-07 ENCOUNTER — Encounter (HOSPITAL_BASED_OUTPATIENT_CLINIC_OR_DEPARTMENT_OTHER): Payer: Self-pay | Admitting: Emergency Medicine

## 2019-06-07 ENCOUNTER — Inpatient Hospital Stay (HOSPITAL_COMMUNITY)
Admission: EM | Admit: 2019-06-07 | Discharge: 2019-06-16 | DRG: 871 | Disposition: A | Discharge status: Hospice | Payer: Medicare HMO | Attending: Internal Medicine | Admitting: Internal Medicine

## 2019-06-07 ENCOUNTER — Other Ambulatory Visit: Payer: Self-pay

## 2019-06-07 ENCOUNTER — Emergency Department (HOSPITAL_COMMUNITY): Payer: Medicare HMO

## 2019-06-07 ENCOUNTER — Emergency Department (HOSPITAL_BASED_OUTPATIENT_CLINIC_OR_DEPARTMENT_OTHER): Payer: Medicare HMO

## 2019-06-07 ENCOUNTER — Encounter (HOSPITAL_COMMUNITY): Payer: Self-pay | Admitting: Pulmonary Disease

## 2019-06-07 DIAGNOSIS — Z7189 Other specified counseling: Secondary | ICD-10-CM | POA: Diagnosis not present

## 2019-06-07 DIAGNOSIS — E162 Hypoglycemia, unspecified: Secondary | ICD-10-CM | POA: Diagnosis not present

## 2019-06-07 DIAGNOSIS — R6521 Severe sepsis with septic shock: Secondary | ICD-10-CM | POA: Diagnosis present

## 2019-06-07 DIAGNOSIS — I351 Nonrheumatic aortic (valve) insufficiency: Secondary | ICD-10-CM | POA: Diagnosis not present

## 2019-06-07 DIAGNOSIS — Z515 Encounter for palliative care: Secondary | ICD-10-CM | POA: Diagnosis not present

## 2019-06-07 DIAGNOSIS — J9601 Acute respiratory failure with hypoxia: Secondary | ICD-10-CM | POA: Diagnosis not present

## 2019-06-07 DIAGNOSIS — J189 Pneumonia, unspecified organism: Secondary | ICD-10-CM | POA: Diagnosis present

## 2019-06-07 DIAGNOSIS — I340 Nonrheumatic mitral (valve) insufficiency: Secondary | ICD-10-CM | POA: Diagnosis not present

## 2019-06-07 DIAGNOSIS — F1120 Opioid dependence, uncomplicated: Secondary | ICD-10-CM | POA: Diagnosis present

## 2019-06-07 DIAGNOSIS — I110 Hypertensive heart disease with heart failure: Secondary | ICD-10-CM | POA: Diagnosis present

## 2019-06-07 DIAGNOSIS — I083 Combined rheumatic disorders of mitral, aortic and tricuspid valves: Secondary | ICD-10-CM | POA: Diagnosis present

## 2019-06-07 DIAGNOSIS — R531 Weakness: Secondary | ICD-10-CM | POA: Diagnosis not present

## 2019-06-07 DIAGNOSIS — E785 Hyperlipidemia, unspecified: Secondary | ICD-10-CM | POA: Diagnosis present

## 2019-06-07 DIAGNOSIS — J449 Chronic obstructive pulmonary disease, unspecified: Secondary | ICD-10-CM | POA: Insufficient documentation

## 2019-06-07 DIAGNOSIS — I739 Peripheral vascular disease, unspecified: Secondary | ICD-10-CM | POA: Diagnosis present

## 2019-06-07 DIAGNOSIS — I4819 Other persistent atrial fibrillation: Secondary | ICD-10-CM | POA: Diagnosis present

## 2019-06-07 DIAGNOSIS — J9600 Acute respiratory failure, unspecified whether with hypoxia or hypercapnia: Secondary | ICD-10-CM

## 2019-06-07 DIAGNOSIS — Z6823 Body mass index (BMI) 23.0-23.9, adult: Secondary | ICD-10-CM

## 2019-06-07 DIAGNOSIS — M1990 Unspecified osteoarthritis, unspecified site: Secondary | ICD-10-CM | POA: Diagnosis present

## 2019-06-07 DIAGNOSIS — J440 Chronic obstructive pulmonary disease with acute lower respiratory infection: Secondary | ICD-10-CM | POA: Diagnosis not present

## 2019-06-07 DIAGNOSIS — I469 Cardiac arrest, cause unspecified: Secondary | ICD-10-CM | POA: Diagnosis not present

## 2019-06-07 DIAGNOSIS — R110 Nausea: Secondary | ICD-10-CM | POA: Insufficient documentation

## 2019-06-07 DIAGNOSIS — R0902 Hypoxemia: Secondary | ICD-10-CM

## 2019-06-07 DIAGNOSIS — R441 Visual hallucinations: Secondary | ICD-10-CM | POA: Insufficient documentation

## 2019-06-07 DIAGNOSIS — E876 Hypokalemia: Secondary | ICD-10-CM | POA: Diagnosis not present

## 2019-06-07 DIAGNOSIS — Z888 Allergy status to other drugs, medicaments and biological substances status: Secondary | ICD-10-CM

## 2019-06-07 DIAGNOSIS — F17210 Nicotine dependence, cigarettes, uncomplicated: Secondary | ICD-10-CM | POA: Diagnosis present

## 2019-06-07 DIAGNOSIS — S2232XA Fracture of one rib, left side, initial encounter for closed fracture: Secondary | ICD-10-CM | POA: Diagnosis present

## 2019-06-07 DIAGNOSIS — R1310 Dysphagia, unspecified: Secondary | ICD-10-CM | POA: Diagnosis present

## 2019-06-07 DIAGNOSIS — G8929 Other chronic pain: Secondary | ICD-10-CM | POA: Diagnosis present

## 2019-06-07 DIAGNOSIS — Z79899 Other long term (current) drug therapy: Secondary | ICD-10-CM | POA: Insufficient documentation

## 2019-06-07 DIAGNOSIS — J438 Other emphysema: Secondary | ICD-10-CM | POA: Diagnosis present

## 2019-06-07 DIAGNOSIS — Z880 Allergy status to penicillin: Secondary | ICD-10-CM

## 2019-06-07 DIAGNOSIS — J9621 Acute and chronic respiratory failure with hypoxia: Secondary | ICD-10-CM | POA: Diagnosis present

## 2019-06-07 DIAGNOSIS — D320 Benign neoplasm of cerebral meninges: Secondary | ICD-10-CM | POA: Diagnosis present

## 2019-06-07 DIAGNOSIS — M810 Age-related osteoporosis without current pathological fracture: Secondary | ICD-10-CM | POA: Diagnosis present

## 2019-06-07 DIAGNOSIS — Z20822 Contact with and (suspected) exposure to covid-19: Secondary | ICD-10-CM | POA: Diagnosis present

## 2019-06-07 DIAGNOSIS — R7989 Other specified abnormal findings of blood chemistry: Secondary | ICD-10-CM | POA: Diagnosis not present

## 2019-06-07 DIAGNOSIS — Z66 Do not resuscitate: Secondary | ICD-10-CM | POA: Diagnosis present

## 2019-06-07 DIAGNOSIS — E878 Other disorders of electrolyte and fluid balance, not elsewhere classified: Secondary | ICD-10-CM | POA: Diagnosis present

## 2019-06-07 DIAGNOSIS — Z881 Allergy status to other antibiotic agents status: Secondary | ICD-10-CM | POA: Insufficient documentation

## 2019-06-07 DIAGNOSIS — Z7901 Long term (current) use of anticoagulants: Secondary | ICD-10-CM | POA: Insufficient documentation

## 2019-06-07 DIAGNOSIS — F419 Anxiety disorder, unspecified: Secondary | ICD-10-CM | POA: Diagnosis present

## 2019-06-07 DIAGNOSIS — R0602 Shortness of breath: Secondary | ICD-10-CM | POA: Diagnosis not present

## 2019-06-07 DIAGNOSIS — A419 Sepsis, unspecified organism: Secondary | ICD-10-CM | POA: Diagnosis present

## 2019-06-07 DIAGNOSIS — I5031 Acute diastolic (congestive) heart failure: Secondary | ICD-10-CM | POA: Diagnosis present

## 2019-06-07 DIAGNOSIS — I493 Ventricular premature depolarization: Secondary | ICD-10-CM | POA: Diagnosis present

## 2019-06-07 DIAGNOSIS — M4000 Postural kyphosis, site unspecified: Secondary | ICD-10-CM | POA: Diagnosis not present

## 2019-06-07 DIAGNOSIS — E782 Mixed hyperlipidemia: Secondary | ICD-10-CM | POA: Insufficient documentation

## 2019-06-07 DIAGNOSIS — J810 Acute pulmonary edema: Secondary | ICD-10-CM | POA: Diagnosis not present

## 2019-06-07 DIAGNOSIS — R54 Age-related physical debility: Secondary | ICD-10-CM | POA: Diagnosis present

## 2019-06-07 DIAGNOSIS — R440 Auditory hallucinations: Secondary | ICD-10-CM | POA: Diagnosis present

## 2019-06-07 DIAGNOSIS — F329 Major depressive disorder, single episode, unspecified: Secondary | ICD-10-CM | POA: Diagnosis present

## 2019-06-07 DIAGNOSIS — I248 Other forms of acute ischemic heart disease: Secondary | ICD-10-CM | POA: Diagnosis present

## 2019-06-07 DIAGNOSIS — I350 Nonrheumatic aortic (valve) stenosis: Secondary | ICD-10-CM | POA: Diagnosis not present

## 2019-06-07 DIAGNOSIS — S2222XA Fracture of body of sternum, initial encounter for closed fracture: Secondary | ICD-10-CM | POA: Diagnosis present

## 2019-06-07 DIAGNOSIS — S2231XA Fracture of one rib, right side, initial encounter for closed fracture: Secondary | ICD-10-CM | POA: Diagnosis present

## 2019-06-07 DIAGNOSIS — E43 Unspecified severe protein-calorie malnutrition: Secondary | ICD-10-CM | POA: Diagnosis present

## 2019-06-07 DIAGNOSIS — I4891 Unspecified atrial fibrillation: Secondary | ICD-10-CM

## 2019-06-07 DIAGNOSIS — I2510 Atherosclerotic heart disease of native coronary artery without angina pectoris: Secondary | ICD-10-CM | POA: Diagnosis present

## 2019-06-07 DIAGNOSIS — W19XXXA Unspecified fall, initial encounter: Secondary | ICD-10-CM | POA: Diagnosis present

## 2019-06-07 DIAGNOSIS — M40294 Other kyphosis, thoracic region: Secondary | ICD-10-CM | POA: Diagnosis present

## 2019-06-07 DIAGNOSIS — J9690 Respiratory failure, unspecified, unspecified whether with hypoxia or hypercapnia: Secondary | ICD-10-CM

## 2019-06-07 DIAGNOSIS — R079 Chest pain, unspecified: Secondary | ICD-10-CM | POA: Diagnosis not present

## 2019-06-07 DIAGNOSIS — R34 Anuria and oliguria: Secondary | ICD-10-CM | POA: Diagnosis not present

## 2019-06-07 DIAGNOSIS — Z9114 Patient's other noncompliance with medication regimen: Secondary | ICD-10-CM

## 2019-06-07 DIAGNOSIS — I10 Essential (primary) hypertension: Secondary | ICD-10-CM | POA: Insufficient documentation

## 2019-06-07 DIAGNOSIS — R296 Repeated falls: Secondary | ICD-10-CM | POA: Diagnosis present

## 2019-06-07 DIAGNOSIS — D329 Benign neoplasm of meninges, unspecified: Secondary | ICD-10-CM | POA: Diagnosis present

## 2019-06-07 DIAGNOSIS — Z9119 Patient's noncompliance with other medical treatment and regimen: Secondary | ICD-10-CM

## 2019-06-07 HISTORY — DX: Scoliosis, unspecified: M41.9

## 2019-06-07 LAB — BASIC METABOLIC PANEL
Anion gap: 10 (ref 5–15)
BUN: 16 mg/dL (ref 8–23)
CO2: 26 mmol/L (ref 22–32)
Calcium: 8.5 mg/dL — ABNORMAL LOW (ref 8.9–10.3)
Chloride: 103 mmol/L (ref 98–111)
Creatinine, Ser: 0.9 mg/dL (ref 0.44–1.00)
GFR calc Af Amer: >60 mL/min (ref >60)
GFR calc non Af Amer: >60 mL/min (ref >60)
Glucose, Bld: 144 mg/dL — ABNORMAL HIGH (ref 70–99)
Potassium: 4.2 mmol/L (ref 3.5–5.1)
Sodium: 139 mmol/L (ref 135–145)

## 2019-06-07 LAB — COMPREHENSIVE METABOLIC PANEL
ALT: 13 U/L (ref 0–44)
AST: 16 U/L (ref 15–41)
Albumin: 3.7 g/dL (ref 3.5–5.0)
Alkaline Phosphatase: 75 U/L (ref 38–126)
Anion gap: 12 (ref 5–15)
BUN: 14 mg/dL (ref 8–23)
CO2: 26 mmol/L (ref 22–32)
Calcium: 9.4 mg/dL (ref 8.9–10.3)
Chloride: 102 mmol/L (ref 98–111)
Creatinine, Ser: 0.77 mg/dL (ref 0.44–1.00)
GFR calc Af Amer: >60 mL/min (ref >60)
GFR calc non Af Amer: >60 mL/min (ref >60)
Glucose, Bld: 110 mg/dL — ABNORMAL HIGH (ref 70–99)
Potassium: 4.2 mmol/L (ref 3.5–5.1)
Sodium: 140 mmol/L (ref 135–145)
Total Bilirubin: 0.8 mg/dL (ref 0.3–1.2)
Total Protein: 7.1 g/dL (ref 6.5–8.1)

## 2019-06-07 LAB — BLOOD GAS, ARTERIAL
Acid-base deficit: 0.6 mmol/L (ref 0.0–2.0)
Bicarbonate: 25 mmol/L (ref 20.0–28.0)
Drawn by: 295031
FIO2: 100
MECHVT: 490 mL
O2 Saturation: 96.5 %
PEEP: 5 cmH2O
Patient temperature: 98.6
RATE: 20 resp/min
pCO2 arterial: 48 mmHg (ref 32.0–48.0)
pH, Arterial: 7.337 — ABNORMAL LOW (ref 7.350–7.450)
pO2, Arterial: 94 mmHg (ref 83.0–108.0)

## 2019-06-07 LAB — CBC WITH DIFFERENTIAL/PLATELET
Abs Immature Granulocytes: 0.02 10*3/uL (ref 0.00–0.07)
Basophils Absolute: 0.1 10*3/uL (ref 0.0–0.1)
Basophils Relative: 1 %
Eosinophils Absolute: 0.1 10*3/uL (ref 0.0–0.5)
Eosinophils Relative: 1 %
HCT: 44.5 % (ref 36.0–46.0)
Hemoglobin: 14.3 g/dL (ref 12.0–15.0)
Immature Granulocytes: 0 %
Lymphocytes Relative: 14 %
Lymphs Abs: 1.2 10*3/uL (ref 0.7–4.0)
MCH: 31.1 pg (ref 26.0–34.0)
MCHC: 32.1 g/dL (ref 30.0–36.0)
MCV: 96.7 fL (ref 80.0–100.0)
Monocytes Absolute: 0.7 10*3/uL (ref 0.1–1.0)
Monocytes Relative: 8 %
Neutro Abs: 6.5 10*3/uL (ref 1.7–7.7)
Neutrophils Relative %: 76 %
Platelets: 211 10*3/uL (ref 150–400)
RBC: 4.6 MIL/uL (ref 3.87–5.11)
RDW: 14.1 % (ref 11.5–15.5)
WBC: 8.5 10*3/uL (ref 4.0–10.5)
nRBC: 0 % (ref 0.0–0.2)

## 2019-06-07 LAB — GLUCOSE, CAPILLARY
Glucose-Capillary: 98 mg/dL (ref 70–99)
Glucose-Capillary: 144 mg/dL — ABNORMAL HIGH (ref 70–99)

## 2019-06-07 LAB — URINALYSIS, MICROSCOPIC (REFLEX)

## 2019-06-07 LAB — URINALYSIS, ROUTINE W REFLEX MICROSCOPIC
Bilirubin Urine: NEGATIVE
Glucose, UA: NEGATIVE mg/dL
Ketones, ur: NEGATIVE mg/dL
Leukocytes,Ua: NEGATIVE
Nitrite: NEGATIVE
Protein, ur: NEGATIVE mg/dL
Specific Gravity, Urine: 1.02 (ref 1.005–1.030)
pH: 5.5 (ref 5.0–8.0)

## 2019-06-07 LAB — D-DIMER, QUANTITATIVE: D-Dimer, Quant: 3.74 ug/mL-FEU — ABNORMAL HIGH (ref 0.00–0.50)

## 2019-06-07 LAB — LACTIC ACID, PLASMA
Lactic Acid, Venous: 6.9 mmol/L (ref 0.5–1.9)
Lactic Acid, Venous: 1.7 mmol/L (ref 0.5–1.9)
Lactic Acid, Venous: 2.5 mmol/L (ref 0.5–1.9)
Lactic Acid, Venous: 2.1 mmol/L (ref 0.5–1.9)

## 2019-06-07 LAB — PHOSPHORUS: Phosphorus: 3.6 mg/dL (ref 2.5–4.6)

## 2019-06-07 LAB — AMYLASE: Amylase: 16 U/L — ABNORMAL LOW (ref 28–100)

## 2019-06-07 LAB — CBC
HCT: 46.5 % — ABNORMAL HIGH (ref 36.0–46.0)
Hemoglobin: 14.2 g/dL (ref 12.0–15.0)
MCH: 31.5 pg (ref 26.0–34.0)
MCHC: 30.5 g/dL (ref 30.0–36.0)
MCV: 103.1 fL — ABNORMAL HIGH (ref 80.0–100.0)
Platelets: 202 10*3/uL (ref 150–400)
RBC: 4.51 MIL/uL (ref 3.87–5.11)
RDW: 14 % (ref 11.5–15.5)
WBC: 12.7 10*3/uL — ABNORMAL HIGH (ref 4.0–10.5)
nRBC: 0.7 % — ABNORMAL HIGH (ref 0.0–0.2)

## 2019-06-07 LAB — MRSA PCR SCREENING: MRSA by PCR: NEGATIVE

## 2019-06-07 LAB — LIPASE, BLOOD
Lipase: 19 U/L (ref 11–51)
Lipase: 17 U/L (ref 11–51)

## 2019-06-07 LAB — CBG MONITORING, ED: Glucose-Capillary: 128 mg/dL — ABNORMAL HIGH (ref 70–99)

## 2019-06-07 LAB — PROCALCITONIN: Procalcitonin: 0.14 ng/mL

## 2019-06-07 LAB — CORTISOL: Cortisol, Plasma: 33.1 ug/dL

## 2019-06-07 LAB — TROPONIN I (HIGH SENSITIVITY): Troponin I (High Sensitivity): 9 ng/L (ref <18)

## 2019-06-07 LAB — MAGNESIUM
Magnesium: 2.3 mg/dL (ref 1.7–2.4)
Magnesium: 1.9 mg/dL (ref 1.7–2.4)

## 2019-06-07 LAB — SARS CORONAVIRUS 2 (TAT 6-24 HRS): SARS Coronavirus 2: NEGATIVE

## 2019-06-07 LAB — BRAIN NATRIURETIC PEPTIDE: B Natriuretic Peptide: 1296.5 pg/mL — ABNORMAL HIGH (ref 0.0–100.0)

## 2019-06-07 MED ORDER — SODIUM CHLORIDE 0.9 % IV SOLN
2.0000 g | Freq: Once | INTRAVENOUS | Status: AC
  Administered 2019-06-07: 2 g via INTRAVENOUS
  Filled 2019-06-07: qty 2

## 2019-06-07 MED ORDER — VITAL HIGH PROTEIN PO LIQD
1000.0000 mL | ORAL | Status: DC
  Administered 2019-06-07: 19:00:00 1000 mL

## 2019-06-07 MED ORDER — SODIUM CHLORIDE 0.9 % IV SOLN
2.0000 g | Freq: Three times a day (TID) | INTRAVENOUS | Status: DC
  Administered 2019-06-07 – 2019-06-08 (×2): 2 g via INTRAVENOUS
  Filled 2019-06-07 (×3): qty 2

## 2019-06-07 MED ORDER — ARFORMOTEROL TARTRATE 15 MCG/2ML IN NEBU
15.0000 ug | INHALATION_SOLUTION | Freq: Two times a day (BID) | RESPIRATORY_TRACT | Status: DC
  Administered 2019-06-07 – 2019-06-16 (×18): 15 ug via RESPIRATORY_TRACT
  Filled 2019-06-07 (×20): qty 2

## 2019-06-07 MED ORDER — LEVOFLOXACIN IN D5W 750 MG/150ML IV SOLN: 750.0000 mg | Freq: Once | INTRAVENOUS | Status: DC

## 2019-06-07 MED ORDER — IPRATROPIUM-ALBUTEROL 0.5-2.5 (3) MG/3ML IN SOLN
3.0000 mL | RESPIRATORY_TRACT | Status: DC | PRN
  Administered 2019-06-15: 3 mL via RESPIRATORY_TRACT
  Filled 2019-06-07: qty 3

## 2019-06-07 MED ORDER — PROPOFOL 1000 MG/100ML IV EMUL
5.0000 ug/kg/min | INTRAVENOUS | Status: DC
  Administered 2019-06-07: 5 ug/kg/min via INTRAVENOUS

## 2019-06-07 MED ORDER — ONDANSETRON HCL 4 MG/2ML IJ SOLN
4.0000 mg | Freq: Four times a day (QID) | INTRAMUSCULAR | Status: DC | PRN
  Administered 2019-06-11 – 2019-06-12 (×2): 4 mg via INTRAVENOUS
  Filled 2019-06-07 (×2): qty 2

## 2019-06-07 MED ORDER — ONDANSETRON HCL 4 MG/2ML IJ SOLN
4.0000 mg | Freq: Once | INTRAMUSCULAR | Status: AC
  Administered 2019-06-07: 4 mg via INTRAVENOUS
  Filled 2019-06-07: qty 2

## 2019-06-07 MED ORDER — PRO-STAT SUGAR FREE PO LIQD
30.0000 mL | Freq: Two times a day (BID) | ORAL | Status: DC
  Administered 2019-06-07 – 2019-06-08 (×2): 30 mL
  Filled 2019-06-07 (×2): qty 30

## 2019-06-07 MED ORDER — LACTATED RINGERS IV BOLUS (SEPSIS)
250.0000 mL | Freq: Once | INTRAVENOUS | Status: AC
  Administered 2019-06-07: 250 mL via INTRAVENOUS

## 2019-06-07 MED ORDER — APIXABAN 5 MG PO TABS
5.0000 mg | ORAL_TABLET | Freq: Two times a day (BID) | ORAL | Status: DC
  Administered 2019-06-07 – 2019-06-16 (×14): 5 mg via ORAL
  Filled 2019-06-07 (×14): qty 1

## 2019-06-07 MED ORDER — CHLORHEXIDINE GLUCONATE CLOTH 2 % EX PADS
6.0000 | MEDICATED_PAD | Freq: Every day | CUTANEOUS | Status: DC
  Administered 2019-06-07 – 2019-06-16 (×10): 6 via TOPICAL

## 2019-06-07 MED ORDER — EPINEPHRINE 1 MG/10ML IJ SOSY
PREFILLED_SYRINGE | INTRAMUSCULAR | Status: AC | PRN
  Administered 2019-06-07: 1 mg via INTRAVENOUS

## 2019-06-07 MED ORDER — FAMOTIDINE IN NACL 20-0.9 MG/50ML-% IV SOLN
20.0000 mg | Freq: Two times a day (BID) | INTRAVENOUS | Status: DC
  Administered 2019-06-07 – 2019-06-08 (×2): 20 mg via INTRAVENOUS
  Filled 2019-06-07 (×2): qty 50

## 2019-06-07 MED ORDER — PROPOFOL 1000 MG/100ML IV EMUL
INTRAVENOUS | Status: AC
  Filled 2019-06-07: qty 100

## 2019-06-07 MED ORDER — ORAL CARE MOUTH RINSE
15.0000 mL | OROMUCOSAL | Status: DC
  Administered 2019-06-07 – 2019-06-08 (×6): 15 mL via OROMUCOSAL

## 2019-06-07 MED ORDER — LACTATED RINGERS IV BOLUS (SEPSIS)
500.0000 mL | Freq: Once | INTRAVENOUS | Status: AC
  Administered 2019-06-07: 21:00:00 500 mL via INTRAVENOUS

## 2019-06-07 MED ORDER — DOCUSATE SODIUM 50 MG/5ML PO LIQD: 100.0000 mg | Freq: Two times a day (BID) | ORAL | Status: DC | PRN

## 2019-06-07 MED ORDER — LACTATED RINGERS IV BOLUS
1000.0000 mL | Freq: Once | INTRAVENOUS | Status: AC
  Administered 2019-06-07: 1000 mL via INTRAVENOUS

## 2019-06-07 MED ORDER — VANCOMYCIN HCL 1500 MG/300ML IV SOLN
1500.0000 mg | Freq: Once | INTRAVENOUS | Status: AC
  Administered 2019-06-07: 1500 mg via INTRAVENOUS
  Filled 2019-06-07: qty 300

## 2019-06-07 MED ORDER — LACTATED RINGERS IV BOLUS (SEPSIS)
1000.0000 mL | Freq: Once | INTRAVENOUS | Status: AC
  Administered 2019-06-07: 1000 mL via INTRAVENOUS

## 2019-06-07 MED ORDER — VANCOMYCIN HCL IN DEXTROSE 1-5 GM/200ML-% IV SOLN
1000.0000 mg | Freq: Once | INTRAVENOUS | Status: DC
  Filled 2019-06-07: qty 200

## 2019-06-07 MED ORDER — VANCOMYCIN HCL 500 MG/100ML IV SOLN
500.0000 mg | Freq: Once | INTRAVENOUS | Status: DC
  Filled 2019-06-07: qty 100

## 2019-06-07 MED ORDER — MIDAZOLAM HCL 2 MG/2ML IJ SOLN
1.0000 mg | INTRAMUSCULAR | Status: DC | PRN
  Administered 2019-06-08: 05:00:00 1 mg via INTRAVENOUS
  Filled 2019-06-07: qty 2

## 2019-06-07 MED ORDER — FENTANYL CITRATE (PF) 100 MCG/2ML IJ SOLN
25.0000 ug | INTRAMUSCULAR | Status: DC | PRN
  Administered 2019-06-08 (×2): 100 ug via INTRAVENOUS
  Filled 2019-06-07 (×2): qty 2

## 2019-06-07 MED ORDER — FENTANYL CITRATE (PF) 100 MCG/2ML IJ SOLN: 25.0000 ug | INTRAMUSCULAR | Status: DC | PRN

## 2019-06-07 MED ORDER — SODIUM CHLORIDE 0.9 % IV BOLUS
1000.0000 mL | Freq: Once | INTRAVENOUS | Status: AC
  Administered 2019-06-07: 1000 mL via INTRAVENOUS

## 2019-06-07 MED ORDER — MIDAZOLAM HCL 2 MG/2ML IJ SOLN
1.0000 mg | INTRAMUSCULAR | Status: AC | PRN
  Administered 2019-06-08 (×3): 1 mg via INTRAVENOUS
  Filled 2019-06-07 (×3): qty 2

## 2019-06-07 MED ORDER — CHLORHEXIDINE GLUCONATE 0.12% ORAL RINSE (MEDLINE KIT)
15.0000 mL | Freq: Two times a day (BID) | OROMUCOSAL | Status: DC
  Administered 2019-06-07 – 2019-06-08 (×2): 15 mL via OROMUCOSAL

## 2019-06-07 MED ORDER — BUDESONIDE 0.5 MG/2ML IN SUSP
0.5000 mg | Freq: Two times a day (BID) | RESPIRATORY_TRACT | Status: DC
  Administered 2019-06-07 – 2019-06-16 (×18): 0.5 mg via RESPIRATORY_TRACT
  Filled 2019-06-07 (×19): qty 2

## 2019-06-07 MED ORDER — DEXMEDETOMIDINE HCL IN NACL 200 MCG/50ML IV SOLN
0.0000 ug/kg/h | INTRAVENOUS | Status: DC
  Administered 2019-06-07: 0.4 ug/kg/h via INTRAVENOUS
  Administered 2019-06-07: 1 ug/kg/h via INTRAVENOUS
  Administered 2019-06-08 (×2): 1.7 ug/kg/h via INTRAVENOUS
  Administered 2019-06-08: 02:00:00 1.6 ug/kg/h via INTRAVENOUS
  Administered 2019-06-08: 1.4 ug/kg/h via INTRAVENOUS
  Administered 2019-06-08: 08:00:00 1.7 ug/kg/h via INTRAVENOUS
  Filled 2019-06-07 (×7): qty 50

## 2019-06-07 MED ORDER — ACETAMINOPHEN 325 MG PO TABS
650.0000 mg | ORAL_TABLET | ORAL | Status: DC | PRN
  Administered 2019-06-08 – 2019-06-10 (×3): 650 mg via ORAL
  Filled 2019-06-07 (×3): qty 2

## 2019-06-07 MED ORDER — SODIUM CHLORIDE 0.9 % IV SOLN
500.0000 mg | Freq: Once | INTRAVENOUS | Status: AC
  Administered 2019-06-07: 500 mg via INTRAVENOUS
  Filled 2019-06-07: qty 500

## 2019-06-07 MED FILL — Medication: Qty: 1 | Status: AC

## 2019-06-07 NOTE — ED Provider Notes (Signed)
[facility] EMERGENCY DEPARTMENT Provider Note   CSN: [id number] Arrival date & time: 06/07/19  3893     History Chief Complaint  Patient presents with  . multiple complaints.    Joann Ford is a 74 y.o. female.  Past medical history A. fib, hyperlipidemia, scoliosis, anxiety, chronic pain.  Presents ER with multiple concerns, over the past few weeks patient has been "wobbly", frequent falls.  Has had increased memory issues, occasionally having auditory and visual hallucinations.  No command hallucinations.  Patient denies any thoughts of hurting herself or others.  Husband confirms patient does not endorse any thoughts of hurting herself or others.  With these frequent falls, around 5 over the past month, no head trauma.  Patient still able to ambulate independently.  Over the past couple weeks she is also a bit nauseated, no vomiting, no abdominal pain associated with this.  Has had poor appetite.  Still able to tolerate p.o.  HPI     Past Medical History:  Diagnosis Date  . Anxiety   . Arthritis   . Atrial fibrillation ([place])   . Chronic pain   . Hyperlipidemia   . Osteoporosis   . Scoliosis     Patient Active Problem List   Diagnosis Date Noted  . Pressure injury of skin 01/22/2019  . Chronic respiratory failure with hypoxia ([place]) 01/21/2019  . Multiple closed fractures of ribs of left side   . Atrial fibrillation with RVR ([place]) 02/05/2018  . Longstanding persistent atrial fibrillation ([place]) 02/05/2018  . Major depressive disorder with single episode, in partial remission ([place]) 10/28/2017  . Dyslipidemia 07/22/2017  . Aortic valve stenosis 07/22/2017  . Pre-operative cardiovascular examination 06/08/2017  . Murmur 06/08/2017  . Other emphysema ([place]) 07/09/2016  . Right hip pain 09/14/2013  . Chronic pain 08/20/2013  . Depression 08/20/2013  . Neuropathy 08/20/2013  . HYPOKALEMIA 12/18/2008  . DYSPEPSIA 12/18/2008  . Postmenopausal status 12/18/2008    . SINUSITIS - ACUTE-NOS 11/21/2008  . COPD (chronic obstructive pulmonary disease) ([place]) 11/21/2008  . CERUMEN IMPACTION, BILATERAL 09/19/2008  . ELEVATED BP READING WITHOUT DX HYPERTENSION 03/06/2008  . HYPERTENSION 02/07/2008  . EDEMA 10/14/2007  . DOE (dyspnea on exertion) 10/14/2007  . Spinal stenosis of lumbar region 09/28/2007  . Personal history of tobacco use, presenting hazards to health 11/23/2006  . B12 deficiency 08/17/2006  . Hyperlipidemia 08/17/2006  . Anxiety state 08/17/2006  . Other idiopathic scoliosis, thoracolumbar region 08/17/2006  . OBESITY NOS 04/01/2006  . PANIC ATTACK 04/01/2006    Past Surgical History:  Procedure Laterality Date  . CARDIOVERSION N/A 02/08/2018   Procedure: CARDIOVERSION;  Surgeon: Pixie Casino, MD;  Location[facility] ENDOSCOPY;  Service: Cardiovascular;  Laterality: N/A;  . I and D of boil  1998, 1999   bilateral axilla  . TEE WITHOUT CARDIOVERSION N/A 02/08/2018   Procedure: TRANSESOPHAGEAL ECHOCARDIOGRAM (TEE);  Surgeon: Pixie Casino, MD;  Location: [facility] ENDOSCOPY;  Service: Cardiovascular;  Laterality: N/A;     OB History   No obstetric history on file.     Family History  Problem Relation Age of Onset  . Cancer Father        Jaw Cancer  . Pneumonia Mother   . Cancer Cousin        jaw  . Colon cancer Neg Hx   . Stomach cancer Neg Hx     Social History   Tobacco Use  . Smoking status: Current Every Day Smoker    Packs/day:  0.50    Years: 40.00    Pack years: 20.00    Types: Cigarettes    Start date: 03/15/1965  . Smokeless tobacco: Never Used  Substance Use Topics  . Alcohol use: No  . Drug use: No    Home Medications Prior to Admission medications   Medication Sig Start Date End Date Taking? Authorizing Provider  albuterol (PROVENTIL) (2.5 MG/3ML) 0.083% nebulizer solution Take 3 mLs (2.5 mg total) by nebulization every 6 (six) hours as needed for wheezing or shortness of breath. 05/18/17   Collene Gobble,  MD  albuterol (VENTOLIN HFA) 108 (90 Base) MCG/ACT inhaler Inhale 2 puffs into the lungs every 6 (six) hours as needed for wheezing or shortness of breath.    [provider]  apixaban (ELIQUIS) 5 MG TABS tablet Take 1 tablet (5 mg total) by mouth 2 (two) times daily. 01/07/19   Hilty, Nadean Corwin, MD  diphenhydramine-acetaminophen (TYLENOL PM) 25-500 MG TABS tablet Take 2 tablets by mouth at bedtime as needed (for sleep).     [provider]  escitalopram (LEXAPRO) 20 MG tablet Take 1 tablet (20 mg total) by mouth daily. 09/29/11   Roma Schanz R, DO  furosemide (LASIX) 40 MG tablet TAKE 1 TABLET EVERY DAY 03/08/19   Hilty, Nadean Corwin, MD  gabapentin (NEURONTIN) 300 MG capsule Take 300-600 mg by mouth See admin instructions. Take 300 mg by mouth in the morning and 600 mg in the evening    [provider]  morphine (MS CONTIN) 15 MG 12 hr tablet Take 15 mg by mouth every 12 (twelve) hours.  04/16/18   [provider]  Tiotropium Bromide-Olodaterol (STIOLTO RESPIMAT) 2.5-2.5 MCG/ACT AERS Inhale 2 puffs into the lungs daily. 03/15/19   Collene Gobble, MD    Allergies    Cefadroxil, Ciprofloxacin, Penicillins, and Atorvastatin  Review of Systems   Review of Systems  Constitutional: Negative for chills and fever.  HENT: Negative for ear pain and sore throat.   Eyes: Negative for pain and visual disturbance.  Respiratory: Negative for cough and shortness of breath.   Cardiovascular: Negative for chest pain and palpitations.  Gastrointestinal: Positive for nausea. Negative for abdominal pain and vomiting.  Genitourinary: Negative for dysuria and hematuria.  Musculoskeletal: Negative for arthralgias and back pain.  Skin: Negative for color change and rash.  Neurological: Negative for seizures and syncope.  All other systems reviewed and are negative.   Physical Exam Updated Vital Signs BP 107/69 (BP Location: Right Arm)   Pulse 82   Temp 97.9 F (36.6 C)  (Oral)   Resp 20   Ht 5' 7"  (1.702 m)   Wt 67 kg   SpO2 (!) 86%   BMI 23.12 kg/m   Physical Exam Vitals and nursing note reviewed.  Constitutional:      General: She is not in acute distress.    Appearance: She is well-developed.  HENT:     Head: Normocephalic and atraumatic.  Eyes:     Conjunctiva/sclera: Conjunctivae normal.  Cardiovascular:     Rate and Rhythm: Normal rate and regular rhythm.     Heart sounds: No murmur.  Pulmonary:     Effort: Pulmonary effort is normal. No respiratory distress.     Breath sounds: Normal breath sounds.  Abdominal:     Palpations: Abdomen is soft.     Tenderness: There is no abdominal tenderness.  Musculoskeletal:     Cervical back: Neck supple.     Comments:  LUE: Small echymosis noted over left shoulder, no TTP or deformity noted throughout RUE: No TTP or deformity noted RLEE: No TTP or deformity noted LLE: No TTP or deformity noted Back: marked curvature of spine (chronic), no step off or deformity, no TTP in C, T, L spine  Skin:    General: Skin is warm and dry.  Neurological:     General: No focal deficit present.     Mental Status: She is alert and oriented to person, place, and time.     Comments: Ambulatory in room without assistance, normal gait CN II-XII intact, speech clear, normal FNF, 5/5 strength in b/l UE and LE  Psychiatric:        Mood and Affect: Mood normal.        Behavior: Behavior normal.     ED Results / Procedures / Treatments   Labs (all labs ordered are listed, but only abnormal results are displayed) Labs Reviewed  COMPREHENSIVE METABOLIC PANEL - Abnormal; Notable for the following components:      Result Value   Glucose, Bld 110 (*)    All other components within normal limits  URINALYSIS, ROUTINE W REFLEX MICROSCOPIC - Abnormal; Notable for the following components:   Hgb urine dipstick SMALL (*)    All other components within normal limits  URINALYSIS, MICROSCOPIC (REFLEX) - Abnormal; Notable  for the following components:   Bacteria, UA FEW (*)    All other components within normal limits  CBC WITH DIFFERENTIAL/PLATELET  LIPASE, BLOOD    EKG EKG Interpretation  Date/Time:  Tuesday June 07 2019 08:20:21 EDT Ventricular Rate:  113 PR Interval:    QRS Duration: 88 QT Interval:  346 QTC Calculation: 464 R Axis:   -6 Text Interpretation: Atrial fibrillation Paired ventricular premature complexes Low voltage, precordial leads Borderline repolarization abnormality Confirmed by Madalyn Rob [phone]) on 06/07/2019 8:21:36 AM   Radiology DG Chest 2 View  Result Date: 06/07/2019 CLINICAL DATA:  Frequent falls with chest pain, initial encounter EXAM: CHEST - 2 VIEW COMPARISON:  01/21/2019 FINDINGS: Cardiac shadow is enlarged but stable. Aortic calcifications are again seen. Elevation of the right hemidiaphragm is again noted with compressive atelectasis chronic in appearance. No acute infiltrate is seen. No sizable effusion is noted. Diffuse osteopenia is seen. IMPRESSION: Elevated right hemidiaphragm with compressive atelectasis stable from the prior exam. Electronically Signed   By: Inez Catalina M.D.   On: 06/07/2019 09:24   DG Pelvis 1-2 Views  Result Date: 06/07/2019 CLINICAL DATA:  Recent falls EXAM: PELVIS - 1-2 VIEW COMPARISON:  None. FINDINGS: Pelvic ring is intact. No acute fracture or dislocation is noted. Degenerative changes of the hip joints are noted right greater than left. Degenerative changes of the lumbar spine are seen as well. No soft tissue abnormality is noted. IMPRESSION: Degenerative change without acute abnormality. Electronically Signed   By: Inez Catalina M.D.   On: 06/07/2019 09:24   CT Head Wo Contrast  Result Date: 06/07/2019 CLINICAL DATA:  Confusion and memory loss EXAM: CT HEAD WITHOUT CONTRAST TECHNIQUE: Contiguous axial images were obtained from the base of the skull through the vertex without intravenous contrast. COMPARISON:  None. FINDINGS: Brain:  There is mild diffuse atrophy. There is a focal calcification arising from the dura in the superior left frontal lobe region anteriorly measuring 1.9 x 1.3 cm without surrounding edema or mass effect. This calcified meningioma is not felt to have clinical significance. No other evident mass. There is no hemorrhage, extra-axial fluid collection,  or midline shift. 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 There is mild patchy periventricular small vessel disease in the centra semiovale bilaterally. No acute appearing infarct evident. Vascular: No hyperdense vessels evident. There is calcification in each carotid siphon region. Skull: The bony calvarium appears intact. Sinuses/Orbits: There is mucosal thickening in several ethmoid air cells. Other paranasal sinuses clear. Orbits appear symmetric bilaterally. Other: Mastoid air cells are clear. IMPRESSION: Mild diffuse atrophy. Calcified meningioma anterior superior left frontal lobe measuring 1.9 x 1.3 cm without mass effect or edema. This finding is not felt to have clinical significance. 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 There is patchy periventricular small vessel disease. No acute infarct evident. No hemorrhage. Foci of arterial vascular calcification noted. Mucosal thickening noted in several ethmoid air cells. Electronically Signed   By: Lowella Grip III M.D.   On: 06/07/2019 08:59   DG Shoulder Left  Result Date: 06/07/2019 CLINICAL DATA:  Left shoulder pain following falls, initial encounter EXAM: LEFT SHOULDER - 2 VIEW COMPARISON:  None. FINDINGS: Degenerative changes of the acromioclavicular joint are seen. The humeral head is somewhat high-riding which may be related to an underlying rotator cuff injury. No dislocation or acute fracture is seen. Bony skeleton of the thorax appears within normal limits. IMPRESSION: Degenerative changes without acute  abnormality. Electronically Signed   By: Inez Catalina M.D.   On: 06/07/2019 09:25    Procedures Procedures (including critical care time)  Medications Ordered in ED Medications  ondansetron (ZOFRAN) injection 4 mg (4 mg Intravenous Given 06/07/19 0840)    ED Course  I have reviewed the triage vital signs and the nursing notes.  Pertinent labs & imaging results that were available during my care of the patient were reviewed by me and considered in my medical decision making (see chart for details).  Clinical Course as of Jun 06 1105  Tue Jun 07, 2019  1000 Rechecked, remains well appearing, in no distress; reviewed imaging labs with pt and husband, vomiting improved, will dc home   [RD]  1031 Notified by staff, repeat vitals at dc with O2 sat ~86%, previously had been okay - pt denies new symptoms, I recommended pt stay in ER for further testing, observation, breathing treatment - pt and husband adamant about discharge home; acknowledged risks of dc, willing to return for worsening symptoms, has O2 monitor at home   [RD]    Clinical Course User Index [RD] Lucrezia Starch, MD   MDM Rules/Calculators/A&P                      74 year old lady presenting to ER with myriad complaints.  Most notable for frequent falls, generalized weakness, unsteadiness and foggy headedness.  On exam, patient was noted to be chronically ill-appearing but no acute distress.  Noted small bruise over left shoulder but no other significant trauma noted on my head to toe examination.  CT head negative for acute process.  Did demonstrate calcified meningioma which the radiologist felt unlikely to have clinical significance.  Reviewed this finding with patient and husband and recommended follow-up with their primary doctor for further surveillance.  Labs were grossly within normal limits, UA negative, plain films negative.  Patient able to ambulate in room without assistance.  Given the work-up, exam today, believe  patient can be discharged home with adequate outpatient follow-up.  Given her reported memory issues  and frequent falls, recommended follow-up with a neurologist.  Stressed importance of PCP follow-up.  See above note, after I placed discharge order, patient noted to be hypoxic though she had no additional associated symptoms.  Recommended remaining in ER for additional observation, testing, treatment however patient adamant about going home.  Reviewed my concerns, patient and husband acknowledged these concerns and risks of discharge.  Agreeable for outpatient follow-up plan, return for any worsening symptoms.  Final Clinical Impression(s) / ED Diagnoses Final diagnoses:  Generalized weakness  Fall, initial encounter    Rx / DC Orders ED Discharge Orders         Ordered    Ambulatory referral to Neurology    Comments: An appointment is requested in approximately: 1 week   06/07/19 1019           Lucrezia Starch, MD 06/07/19 [phone]

## 2019-06-07 NOTE — Progress Notes (Signed)
eLink Physician-Brief Progress Note Patient Name: Joann Ford DOB: April 17, 1945 MRN: [id number]   Date of Service  06/07/2019  HPI/Events of Note  Agitation   eICU Interventions  Will order: 1. Increase ceiling on Precedex IV infusion to 1.7 mcg/kg/min.     Intervention Category Major Interventions: Delirium, psychosis, severe agitation - evaluation and management  [name] 06/07/2019, 10:21 PM

## 2019-06-07 NOTE — ED Provider Notes (Signed)
[place] EMERGENCY DEPARTMENT Provider Note  CSN: [id number] Arrival date & time: 06/07/19 1306    History Chief Complaint  Patient presents with  . Cardiac Arrest    HPI  Joann Ford is a 74 y.o. female brought in by EMS from home.  Patient was seen earlier today at the [facility] for hallucinations, memory problems, problems walking with frequent falls.  Patient's ED work-up was essentially unremarkable however just prior to discharge she was noted to be hypoxic.  The provider that saw her then recommended she stay for additional work-up but the patient and husband were adamant that she wanted to go home.  EMS was called back to the house this afternoon for continued problems, when they got there the patient was alert and oriented but on arrival here she was fighting and try to get off the stretcher, she then became bradycardic and unresponsive in our hallway.   Past Medical History:  Diagnosis Date  . Anxiety   . Arthritis   . Atrial fibrillation ([place])   . Chronic pain   . Hyperlipidemia   . Osteoporosis   . Scoliosis     Past Surgical History:  Procedure Laterality Date  . CARDIOVERSION N/A 02/08/2018   Procedure: CARDIOVERSION;  Surgeon: Pixie Casino, MD;  Location: [facility] ENDOSCOPY;  Service: Cardiovascular;  Laterality: N/A;  . I and D of boil  1998, 1999   bilateral axilla  . TEE WITHOUT CARDIOVERSION N/A 02/08/2018   Procedure: TRANSESOPHAGEAL ECHOCARDIOGRAM (TEE);  Surgeon: Pixie Casino, MD;  Location: [facility] ENDOSCOPY;  Service: Cardiovascular;  Laterality: N/A;    Family History  Problem Relation Age of Onset  . Cancer Father        Jaw Cancer  . Pneumonia Mother   . Cancer Cousin        jaw  . Colon cancer Neg Hx   . Stomach cancer Neg Hx     Social History   Tobacco Use  . Smoking status: Current Every Day Smoker    Packs/day: 0.50    Years: 40.00    Pack years: 20.00    Types: Cigarettes    Start date: 03/15/1965  .  Smokeless tobacco: Never Used  Substance Use Topics  . Alcohol use: No  . Drug use: No     Home Medications Prior to Admission medications   Medication Sig Start Date End Date Taking? Authorizing Provider  albuterol (PROVENTIL) (2.5 MG/3ML) 0.083% nebulizer solution Take 3 mLs (2.5 mg total) by nebulization every 6 (six) hours as needed for wheezing or shortness of breath. 05/18/17   Collene Gobble, MD  albuterol (VENTOLIN HFA) 108 (90 Base) MCG/ACT inhaler Inhale 2 puffs into the lungs every 6 (six) hours as needed for wheezing or shortness of breath.    [provider]  apixaban (ELIQUIS) 5 MG TABS tablet Take 1 tablet (5 mg total) by mouth 2 (two) times daily. 01/07/19   Hilty, Nadean Corwin, MD  diphenhydramine-acetaminophen (TYLENOL PM) 25-500 MG TABS tablet Take 2 tablets by mouth at bedtime as needed (for sleep).     [provider]  escitalopram (LEXAPRO) 20 MG tablet Take 1 tablet (20 mg total) by mouth daily. 09/29/11   Roma Schanz R, DO  furosemide (LASIX) 40 MG tablet TAKE 1 TABLET EVERY DAY 03/08/19   Hilty, Nadean Corwin, MD  gabapentin (NEURONTIN) 300 MG capsule Take 300-600 mg by mouth See admin instructions. Take 300 mg by mouth in  the morning and 600 mg in the evening    [provider]  morphine (MS CONTIN) 15 MG 12 hr tablet Take 15 mg by mouth every 12 (twelve) hours.  04/16/18   [provider]  Tiotropium Bromide-Olodaterol (STIOLTO RESPIMAT) 2.5-2.5 MCG/ACT AERS Inhale 2 puffs into the lungs daily. 03/15/19   Collene Gobble, MD     Allergies    Cefadroxil, Ciprofloxacin, Penicillins, and Atorvastatin   Review of Systems   Review of Systems  Unable to perform ROS: Acuity of condition     Physical Exam BP 124/74   Pulse 90   Resp 20   SpO2 100%   Physical Exam Constitutional:      Comments: Unresponsive  HENT:     Head: Normocephalic and atraumatic.  Cardiovascular:     Comments: Pulseless Pulmonary:     Comments:  Apneic Abdominal:     General: Abdomen is flat.  Musculoskeletal:        General: No swelling.     Cervical back: Normal range of motion.     Comments: Skin tear right forearm  Skin:    General: Skin is dry.     Findings: No rash.  Neurological:     Comments: Unresponsive  Psychiatric:     Comments: Unable to assess      ED Results / Procedures / Treatments   Labs (all labs ordered are listed, but only abnormal results are displayed) Labs Reviewed  CBC - Abnormal; Notable for the following components:      Result Value   WBC 12.7 (*)    HCT 46.5 (*)    MCV 103.1 (*)    nRBC 0.7 (*)    All other components within normal limits  BASIC METABOLIC PANEL  LACTIC ACID, PLASMA  MAGNESIUM  TROPONIN I (HIGH SENSITIVITY)    EKG EKG Interpretation  Date/Time:  Tuesday June 07 2019 13:22:14 EDT Ventricular Rate:  137 PR Interval:    QRS Duration: 89 QT Interval:  313 QTC Calculation: 464 R Axis:   -29 Text Interpretation: Atrial fibrillation Borderline left axis deviation Probable anterior infarct, age indeterminate No significant change since last tracing Confirmed by [id number]  MD, Havoc Sanluis [phone]) on 06/07/2019 1:31:50 PM   Radiology DG Chest 2 View  Result Date: 06/07/2019 CLINICAL DATA:  Frequent falls with chest pain, initial encounter EXAM: CHEST - 2 VIEW COMPARISON:  01/21/2019 FINDINGS: Cardiac shadow is enlarged but stable. Aortic calcifications are again seen. Elevation of the right hemidiaphragm is again noted with compressive atelectasis chronic in appearance. No acute infiltrate is seen. No sizable effusion is noted. Diffuse osteopenia is seen. IMPRESSION: Elevated right hemidiaphragm with compressive atelectasis stable from the prior exam. Electronically Signed   By: Inez Catalina M.D.   On: 06/07/2019 09:24   DG Pelvis 1-2 Views  Result Date: 06/07/2019 CLINICAL DATA:  Recent falls EXAM: PELVIS - 1-2 VIEW COMPARISON:  None. FINDINGS: Pelvic ring is intact. No acute  fracture or dislocation is noted. Degenerative changes of the hip joints are noted right greater than left. Degenerative changes of the lumbar spine are seen as well. No soft tissue abnormality is noted. IMPRESSION: Degenerative change without acute abnormality. Electronically Signed   By: Inez Catalina M.D.   On: 06/07/2019 09:24   CT Head Wo Contrast  Result Date: 06/07/2019 CLINICAL DATA:  Confusion and memory loss EXAM: CT HEAD WITHOUT CONTRAST TECHNIQUE: Contiguous axial images were obtained from the base of the skull through the vertex without  intravenous contrast. COMPARISON:  None. FINDINGS: Brain: There is mild diffuse atrophy. There is a focal calcification arising from the dura in the superior left frontal lobe region anteriorly measuring 1.9 x 1.3 cm without surrounding edema or mass effect. This calcified meningioma is not felt to have clinical significance. No other evident mass. There is no hemorrhage, extra-axial fluid collection, or midline shift. 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 There is mild patchy periventricular small vessel disease in the centra semiovale bilaterally. No acute appearing infarct evident. Vascular: No hyperdense vessels evident. There is calcification in each carotid siphon region. Skull: The bony calvarium appears intact. Sinuses/Orbits: There is mucosal thickening in several ethmoid air cells. Other paranasal sinuses clear. Orbits appear symmetric bilaterally. Other: Mastoid air cells are clear. IMPRESSION: Mild diffuse atrophy. Calcified meningioma anterior superior left frontal lobe measuring 1.9 x 1.3 cm without mass effect or edema. This finding is not felt to have clinical significance. 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 There is patchy periventricular small vessel disease. No acute infarct evident. No hemorrhage. Foci of arterial vascular calcification noted. Mucosal  thickening noted in several ethmoid air cells. Electronically Signed   By: Lowella Grip III M.D.   On: 06/07/2019 08:59   DG Shoulder Left  Result Date: 06/07/2019 CLINICAL DATA:  Left shoulder pain following falls, initial encounter EXAM: LEFT SHOULDER - 2 VIEW COMPARISON:  None. FINDINGS: Degenerative changes of the acromioclavicular joint are seen. The humeral head is somewhat high-riding which may be related to an underlying rotator cuff injury. No dislocation or acute fracture is seen. Bony skeleton of the thorax appears within normal limits. IMPRESSION: Degenerative changes without acute abnormality. Electronically Signed   By: Inez Catalina M.D.   On: 06/07/2019 09:25    Procedures Procedure Name: Intubation Date/Time: 06/07/2019 1:44 PM Performed by: Truddie Hidden, MD Pre-anesthesia Checklist: Patient identified Oxygen Delivery Method: Ambu bag Ventilation: Mask ventilation without difficulty Laryngoscope Size: Mac and 3 Tube size: 7.5 mm Number of attempts: 1 Airway Equipment and Method: Stylet Placement Confirmation: ETT inserted through vocal cords under direct vision Secured at: 23 cm Tube secured with: ETT holder Dental Injury: Teeth and Oropharynx as per pre-operative assessment      .Critical Care Performed by: Truddie Hidden, MD Authorized by: Truddie Hidden, MD   Critical care provider statement:    Critical care time (minutes):  40   Critical care time was exclusive of:  Separately billable procedures and treating other patients   Critical care was necessary to treat or prevent imminent or life-threatening deterioration of the following conditions:  Cardiac failure and respiratory failure   Critical care was time spent personally by me on the following activities:  Development of treatment plan with patient or surrogate, discussions with consultants, evaluation of patient's response to treatment, gastric intubation, examination of patient, obtaining  history from patient or surrogate, ordering and performing treatments and interventions, ordering and review of laboratory studies, ordering and review of radiographic studies, pulse oximetry, re-evaluation of patient's condition and review of old charts    Medications Ordered in the ED Medications  propofol (DIPRIVAN) 1000 MG/100ML infusion (has no administration in time range)  propofol (DIPRIVAN) 1000 MG/100ML infusion (25 mcg/kg/min  67 kg Intravenous Rate/Dose Change 06/07/19 1336)  EPINEPHrine (ADRENALIN) 1 MG/10ML injection (1 mg Intravenous Given 06/07/19 1313)     ED Course  I have reviewed  the triage vital signs and the nursing notes.  Pertinent labs & imaging results that were available during my care of the patient were reviewed by me and considered in my medical decision making (see chart for details).  Clinical Course as of Jun 06 1629  Tue Jun 07, 2019  1343 Patient was brought immediately to the ED room, chest compressions were initiated and epi was given.  The patient was intubated without difficulty.  She had return of spontaneous circulation at that point.   [CS]  33 Dr. Lake Bells with ICU is at bedside and will admit. LActic acid is elevated mild hypotension. Sepsis vs cardiogenic shock. LR bolus ordered.    [CS]    Clinical Course User Index [CS] Truddie Hidden, MD    MDM Rules/Calculators/A&P MDM Number of Diagnoses or Management Options Diagnosis management comments: Patient with cardiac arrest of unclear etiology.  Patient was not cooperative with her mask in route and so a primary respiratory arrest is in the differential.  She had rapid return of circulation after intubation and 1 dose of epi.  Labs and imaging from earlier today were reviewed, will recheck basic labs and a chest x-ray for tube placement.    Amount and/or Complexity of Data Reviewed Clinical lab tests: ordered and reviewed Tests in the radiology section of CPT: ordered and  reviewed Review and summarize past medical records: yes    Final Clinical Impression(s) / ED Diagnoses Final diagnoses:  None    Rx / DC Orders ED Discharge Orders    None       Truddie Hidden, MD 06/07/19 [phone]

## 2019-06-07 NOTE — Sepsis Progress Note (Signed)
Notified bedside nurse of need to administer antibiotics.  

## 2019-06-07 NOTE — ED Notes (Signed)
BLOOD SUGAR 128

## 2019-06-07 NOTE — Progress Notes (Signed)
eLink Physician-Brief Progress Note Patient Name: PHALLON CASSIE DOB: Dec 08, 1945 MRN: [id number]   Date of Service  06/07/2019  HPI/Events of Note  Lactic Acid = 2.1.   eICU Interventions  Will bolus with 0.9 NaCl 1 liter IV over 1 hour now.      Intervention Category Major Interventions: Acid-Base disturbance - evaluation and management  Asher Torpey Eugene 06/07/2019, 11:41 PM

## 2019-06-07 NOTE — ED Notes (Signed)
While obtaining DC VS pts O2 Sat was in mid 80s.  Dr. Roslynn Amble and RT came to room.  EDP attempted to get pt to stay for further tx with her breathing but pt refused.  Husband sts she has O2 at home for when it dips down like this and he will put her on it when they get home.

## 2019-06-07 NOTE — ED Triage Notes (Signed)
Husband sts for a few weeks pt has started being "wobbly".  She goes to sleep easily. "Her mind isn't right".  He sts she holds up her hand to her ear as if talking on the phone but she doesn't have a phone there.  For a couple day is SOB and has nausea.  Husband sts he finally talked her in to coming to be checked.

## 2019-06-07 NOTE — Progress Notes (Signed)
Vancomycin scheduled at 1445. Not given in ED. Pt lost second IV during transport, and due to compatibility issues,vanc still not able to be given. STAT  IV team consult placed after RN failed PIV attempt. Will notify pharmacy

## 2019-06-07 NOTE — Progress Notes (Signed)
CRITICAL VALUE ALERT  Critical Value:  Lactic acid 2.1  Date & Time Notied:  06/07/19 @ 11:21  Provider Notified: E-link Dr. Oletta Darter  Orders Received/Actions taken: none at this time

## 2019-06-07 NOTE — ED Notes (Signed)
ED Provider at bedside. 

## 2019-06-07 NOTE — ED Triage Notes (Addendum)
Pt BIB EMS from home. Pt was seen at [facility] this morning for hallucinations but left AMA. Pt was A&O x4. EMS report pt alert and fighting to get off the stretcher up until they arrived here. Pt then became unresponsive and without a pulse. Hx of A fib, COPD, CHF  Initially BP was 80 palpated, EMS gave 500 mL NS.  142/76 prior to being unresponsive 86% RA 95 % on non-rebreather

## 2019-06-07 NOTE — ED Notes (Signed)
Patient transported to CT 

## 2019-06-07 NOTE — Discharge Instructions (Addendum)
Please schedule follow-up appointment with your primary doctor as well as neurology.  If you have further falls, chest pain, difficulty breathing, confusion, vision change, speech change, numbness or weakness, return to ER for reassessment.

## 2019-06-07 NOTE — ED Notes (Signed)
Attempted to call report. No answer.

## 2019-06-07 NOTE — Sepsis Progress Note (Signed)
Notified bedside nurse of need to draw repeat lactic acid. 

## 2019-06-07 NOTE — Progress Notes (Signed)
Pharmacy Antibiotic Note  Joann Ford is a 74 y.o. female admitted on 06/07/2019 with sepsis.  Pharmacy has been consulted for aztreonam dosing. Given vancomycin and azithromycin x1 in ED. Patient reports anaphylaxis to cephalosporins, penicillins, and ciprofloxacin. Source unclear but covering for pulmonary and urinary sources for now.  Plan:  Aztreonam 2g IV q8 hr  F/u with CCM tomorrow AM to re-evaluate what (if any) additional coverage is needed. Have suggested either doxycycline or clindamycin in addition to aztreonam to cover both sources     Temp (24hrs), Avg:97.9 F (36.6 C), Min:97.9 F (36.6 C), Max:97.9 F (36.6 C)  Recent Labs  Lab 06/07/19 0820 06/07/19 1317 06/07/19 1521  WBC 8.5 12.7*  --   CREATININE 0.77 0.90  --   LATICACIDVEN  --  6.9* 1.7    Estimated Creatinine Clearance: 54.1 mL/min (by C-G formula based on SCr of 0.9 mg/dL).    Allergies  Allergen Reactions  . Cefadroxil Anaphylaxis, Swelling and Other (See Comments)    Throat closes  . Ciprofloxacin Anaphylaxis and Other (See Comments)    Throat closes  . Penicillins Anaphylaxis and Swelling    Did it involve swelling of the face/tongue/throat, SOB, or low BP? Yes Did it involve sudden or severe rash/hives, skin peeling, or any reaction on the inside of your mouth or nose? No Did you need to seek medical attention at a hospital or doctor's office? Yes When did it last happen?"10-15 years ago" If all above answers are "NO", may proceed with cephalosporin use.   . Atorvastatin Other (See Comments)    Possible SAMS - 40 mg dose     Thank you for allowing pharmacy to be a part of this patient's care.  Nykeria Mealing A 06/07/2019 5:24 PM

## 2019-06-07 NOTE — ED Notes (Signed)
SpO2 86-87% @ discharge, on r/a, EDP aware & at bedside. Per family O2 available at home.  Patient encouraged to stay but insistent on going home.

## 2019-06-07 NOTE — H&P (Signed)
NAME:  Joann Ford, MRN:  [id number], DOB:  Sep 09, 1945, LOS: 0 ADMISSION DATE:  06/07/2019, CONSULTATION DATE:  4/6 REFERRING MD: Karle Starch, CHIEF COMPLAINT:  Dyspnea   Brief History   74 y/o female admitted from [facility] On 4/6 in the setting of a cardiac arrest.  History of present illness   74 y/o female with COPD, scoliosis admitted on 4/6 after being brought into the [facility] and developing a cardaic arrest.  Previously in the day she presented to the [facility] ED with non-specific complaints including malaise, falls, hallucintations.  Was noted to be hypoxemic, they recommended admission but she left AMA. She was brought to the [facility] ED by EMS today and on arrival was noted to be pulsess. Underwent standard resuscitation via ACLS protocol for < 5 minutes, was intubated, regained a pulse.  No clear cardiac arrhythmia noted.  Moving around, reaching for tube after arrest.  Past Medical History  COPD, followed by Dr. Lamonte Sakai Chronic respiratory failure with hypoxemia, inconsistent use of O2 Restrictive lung disease due to scoliosis Hyperlipidemia Atrial fibrillation Arthritis Anxiety  Significant Hospital Events   4/6 admission  Consults:    Procedures:  4/6 ETT >   Significant Diagnostic Tests:  4/6 CT head > mild diffuse atrophy, calcified meningioma anterior superior left frontal lobe 1.9 x 1.3 cm without mass effect or edema.  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  Patchy periventricular small vessel disease.   Micro Data:  4/6 blood >  4/6 SARS COV 2 >   Antimicrobials:    Interim history/subjective:    Objective   Blood pressure 96/62, pulse 86, resp. rate 19, SpO2 98 %.    Vent Mode: PRVC FiO2 (%):  [100 %] 100 % Set Rate:  [20 bmp] 20 bmp Vt Set:  [490 mL] 490 mL PEEP:  [5 cmH20] 5 cmH20 Plateau Pressure:  [17 cmH20] 17 cmH20  No intake or output data in the 24 hours ending 06/07/19 1354 There were no vitals filed for this  visit.  Examination:  General:  In bed on vent HENT: NCAT ETT in place PULM: CTA B, vent supported breathing CV: RRR, no mgr GI: BS+, soft, nontender MSK: normal bulk and tone Neuro: sedated on vent    Resolved Hospital Problem list     Assessment & Plan:  Cardiac arrest> etiology uncertain, hypotensive now, WBC up, lactic acid up, no clear etiology with non-specific symptoms.  Best to treat as septic shock while working up for cardiac causes as well.  Needs ABG given history of restrictive and obstructive lung disease as well. Source of sepsis uncertain given inability to take history and no clear features on available evidence. > bolus 30cc/kg LR > start levaquin empiricall > check U/A > repeat lactic acid > tele > check d-dimer > check troponin > check SARS COV 2  Acute on chronic respiratory failure with hypoxemia, unclear cause, not wheezing on exam COPD Chronic respiratory failure with hypoxemia, non-compliant with medications > Full mechanical vent support > VAP prevention > Daily WUA/SBT > check ABG > brovana/pulmicort/duoneb  Atrial fib > tele > continue eliquis  Chronic pain Need for sedation for mechanical ventilation PAD protocol RASS goal -1 to -2 Fentanyl, versed prn, precedex infusion   Best practice:  Diet: start tube feeding Pain/Anxiety/Delirium protocol (if indicated): as above VAP protocol (if indicated): as above DVT prophylaxis: eliquis GI prophylaxis: famotidine Glucose control: monitor  Mobility: bed rest Code Status: full Family Communication:  I attempted to call her husband, no answer Disposition: admit to ICU  Labs   CBC: Recent Labs  Lab 06/07/19 0820 06/07/19 1317  WBC 8.5 12.7*  NEUTROABS 6.5  --   HGB 14.3 14.2  HCT 44.5 46.5*  MCV 96.7 103.1*  PLT 211 [id number]    Basic Metabolic Panel: Recent Labs  Lab 06/07/19 0820  NA 140  K 4.2  CL 102  CO2 26  GLUCOSE 110*  BUN 14  CREATININE 0.77  CALCIUM 9.4    GFR: Estimated Creatinine Clearance: 60.9 mL/min (by C-G formula based on SCr of 0.77 mg/dL). Recent Labs  Lab 06/07/19 0820 06/07/19 1317  WBC 8.5 12.7*  LATICACIDVEN  --  6.9*    Liver Function Tests: Recent Labs  Lab 06/07/19 0820  AST 16  ALT 13  ALKPHOS 75  BILITOT 0.8  PROT 7.1  ALBUMIN 3.7   Recent Labs  Lab 06/07/19 0820  LIPASE 19   No results for input(s): AMMONIA in the last 168 hours.  ABG    Component Value Date/Time   PHART 7.445 01/22/2019 0825   PCO2ART 53.1 (H) 01/22/2019 0825   PO2ART 51.1 (L) 01/22/2019 0825   HCO3 35.9 (H) 01/22/2019 0825   O2SAT 86.7 01/22/2019 0825     Coagulation Profile: No results for input(s): INR, PROTIME in the last 168 hours.  Cardiac Enzymes: No results for input(s): CKTOTAL, CKMB, CKMBINDEX, TROPONINI in the last 168 hours.  HbA1C: No results found for: HGBA1C  CBG: Recent Labs  Lab 06/07/19 1336  GLUCAP 128*    Review of Systems:   Cannot obtain due to intubation  Past Medical History  She,  has a past medical history of Anxiety, Arthritis, Atrial fibrillation ([place]), Chronic pain, Hyperlipidemia, Osteoporosis, and Scoliosis.   Surgical History    Past Surgical History:  Procedure Laterality Date  . CARDIOVERSION N/A 02/08/2018   Procedure: CARDIOVERSION;  Surgeon: Pixie Casino, MD;  Location: [facility] ENDOSCOPY;  Service: Cardiovascular;  Laterality: N/A;  . I and D of boil  1998, 1999   bilateral axilla  . TEE WITHOUT CARDIOVERSION N/A 02/08/2018   Procedure: TRANSESOPHAGEAL ECHOCARDIOGRAM (TEE);  Surgeon: Pixie Casino, MD;  Location: [facility] ENDOSCOPY;  Service: Cardiovascular;  Laterality: N/A;     Social History   reports that she has been smoking cigarettes. She started smoking about 54 years ago. She has a 20.00 pack-year smoking history. She has never used smokeless tobacco. She reports that she does not drink alcohol or use drugs.   Family History   Her family history includes Cancer in  her cousin and father; Pneumonia in her mother. There is no history of Colon cancer or Stomach cancer.   Allergies Allergies  Allergen Reactions  . Cefadroxil Anaphylaxis, Swelling and Other (See Comments)    Throat closes  . Ciprofloxacin Anaphylaxis and Other (See Comments)    Throat closes  . Penicillins Anaphylaxis and Swelling    Did it involve swelling of the face/tongue/throat, SOB, or low BP? Yes Did it involve sudden or severe rash/hives, skin peeling, or any reaction on the inside of your mouth or nose? No Did you need to seek medical attention at a hospital or doctor's office? Yes When did it last happen?"10-15 years ago" If all above answers are "NO", may proceed with cephalosporin use.   . Atorvastatin Other (See Comments)    Possible SAMS - 40 mg dose     Home Medications  Prior to Admission medications  Medication Sig Start Date End Date Taking? Authorizing Provider  albuterol (PROVENTIL) (2.5 MG/3ML) 0.083% nebulizer solution Take 3 mLs (2.5 mg total) by nebulization every 6 (six) hours as needed for wheezing or shortness of breath. 05/18/17   Collene Gobble, MD  albuterol (VENTOLIN HFA) 108 (90 Base) MCG/ACT inhaler Inhale 2 puffs into the lungs every 6 (six) hours as needed for wheezing or shortness of breath.    [provider]  apixaban (ELIQUIS) 5 MG TABS tablet Take 1 tablet (5 mg total) by mouth 2 (two) times daily. 01/07/19   Hilty, Nadean Corwin, MD  diphenhydramine-acetaminophen (TYLENOL PM) 25-500 MG TABS tablet Take 2 tablets by mouth at bedtime as needed (for sleep).     [provider]  escitalopram (LEXAPRO) 20 MG tablet Take 1 tablet (20 mg total) by mouth daily. 09/29/11   Roma Schanz R, DO  furosemide (LASIX) 40 MG tablet TAKE 1 TABLET EVERY DAY 03/08/19   Hilty, Nadean Corwin, MD  gabapentin (NEURONTIN) 300 MG capsule Take 300-600 mg by mouth See admin instructions. Take 300 mg by mouth in the morning and 600 mg in the evening     [provider]  morphine (MS CONTIN) 15 MG 12 hr tablet Take 15 mg by mouth every 12 (twelve) hours.  04/16/18   [provider]  Tiotropium Bromide-Olodaterol (STIOLTO RESPIMAT) 2.5-2.5 MCG/ACT AERS Inhale 2 puffs into the lungs daily. 03/15/19   Collene Gobble, MD     Critical care time: 40 minutes     Roselie Awkward, MD [place] Pager: [phone] Cell: [phone] If no response, call [phone]

## 2019-06-07 NOTE — Code Documentation (Signed)
Pulse check - Pulse present

## 2019-06-08 ENCOUNTER — Inpatient Hospital Stay (HOSPITAL_COMMUNITY): Payer: Medicare HMO

## 2019-06-08 DIAGNOSIS — J440 Chronic obstructive pulmonary disease with acute lower respiratory infection: Secondary | ICD-10-CM

## 2019-06-08 DIAGNOSIS — M4000 Postural kyphosis, site unspecified: Secondary | ICD-10-CM

## 2019-06-08 DIAGNOSIS — J810 Acute pulmonary edema: Secondary | ICD-10-CM

## 2019-06-08 LAB — BASIC METABOLIC PANEL
Anion gap: 7 (ref 5–15)
Anion gap: 8 (ref 5–15)
BUN: 19 mg/dL (ref 8–23)
BUN: 17 mg/dL (ref 8–23)
CO2: 24 mmol/L (ref 22–32)
CO2: 28 mmol/L (ref 22–32)
Calcium: 8.2 mg/dL — ABNORMAL LOW (ref 8.9–10.3)
Calcium: 8 mg/dL — ABNORMAL LOW (ref 8.9–10.3)
Chloride: 107 mmol/L (ref 98–111)
Chloride: 105 mmol/L (ref 98–111)
Creatinine, Ser: 0.68 mg/dL (ref 0.44–1.00)
Creatinine, Ser: 0.64 mg/dL (ref 0.44–1.00)
GFR calc Af Amer: >60 mL/min (ref >60)
GFR calc Af Amer: >60 mL/min (ref >60)
GFR calc non Af Amer: >60 mL/min (ref >60)
GFR calc non Af Amer: >60 mL/min (ref >60)
Glucose, Bld: 234 mg/dL — ABNORMAL HIGH (ref 70–99)
Glucose, Bld: 109 mg/dL — ABNORMAL HIGH (ref 70–99)
Potassium: 4 mmol/L (ref 3.5–5.1)
Potassium: 2.9 mmol/L — ABNORMAL LOW (ref 3.5–5.1)
Sodium: 138 mmol/L (ref 135–145)
Sodium: 141 mmol/L (ref 135–145)

## 2019-06-08 LAB — RESPIRATORY PANEL BY PCR

## 2019-06-08 LAB — CBC
HCT: 38.9 % (ref 36.0–46.0)
Hemoglobin: 12.6 g/dL (ref 12.0–15.0)
MCH: 31.4 pg (ref 26.0–34.0)
MCHC: 32.4 g/dL (ref 30.0–36.0)
MCV: 97 fL (ref 80.0–100.0)
Platelets: 141 10*3/uL — ABNORMAL LOW (ref 150–400)
RBC: 4.01 MIL/uL (ref 3.87–5.11)
RDW: 14 % (ref 11.5–15.5)
WBC: 8.1 10*3/uL (ref 4.0–10.5)
nRBC: 0 % (ref 0.0–0.2)

## 2019-06-08 LAB — MAGNESIUM
Magnesium: 1.8 mg/dL (ref 1.7–2.4)
Magnesium: 1.7 mg/dL (ref 1.7–2.4)

## 2019-06-08 LAB — BLOOD GAS, ARTERIAL
Acid-Base Excess: 1 mmol/L (ref 0.0–2.0)
Bicarbonate: 24.3 mmol/L (ref 20.0–28.0)
FIO2: 60
O2 Saturation: 98.7 %
Patient temperature: 98.6
pCO2 arterial: 36 mmHg (ref 32.0–48.0)
pH, Arterial: 7.445 (ref 7.350–7.450)
pO2, Arterial: 106 mmHg (ref 83.0–108.0)

## 2019-06-08 LAB — TYPE AND SCREEN
ABO/RH(D): A POS
Antibody Screen: POSITIVE
PT AG Type: NEGATIVE

## 2019-06-08 LAB — GLUCOSE, CAPILLARY
Glucose-Capillary: 159 mg/dL — ABNORMAL HIGH (ref 70–99)
Glucose-Capillary: 208 mg/dL — ABNORMAL HIGH (ref 70–99)
Glucose-Capillary: 144 mg/dL — ABNORMAL HIGH (ref 70–99)
Glucose-Capillary: 86 mg/dL (ref 70–99)

## 2019-06-08 LAB — PHOSPHORUS: Phosphorus: 3 mg/dL (ref 2.5–4.6)

## 2019-06-08 IMAGING — DX DG CHEST 1V PORT
1 series · 1 of 1 positions shown · non-contrast
Comparison: [DATE]

CLINICAL DATA: Respiratory failure.

EXAM:
PORTABLE CHEST 1 VIEW

[chest ap]
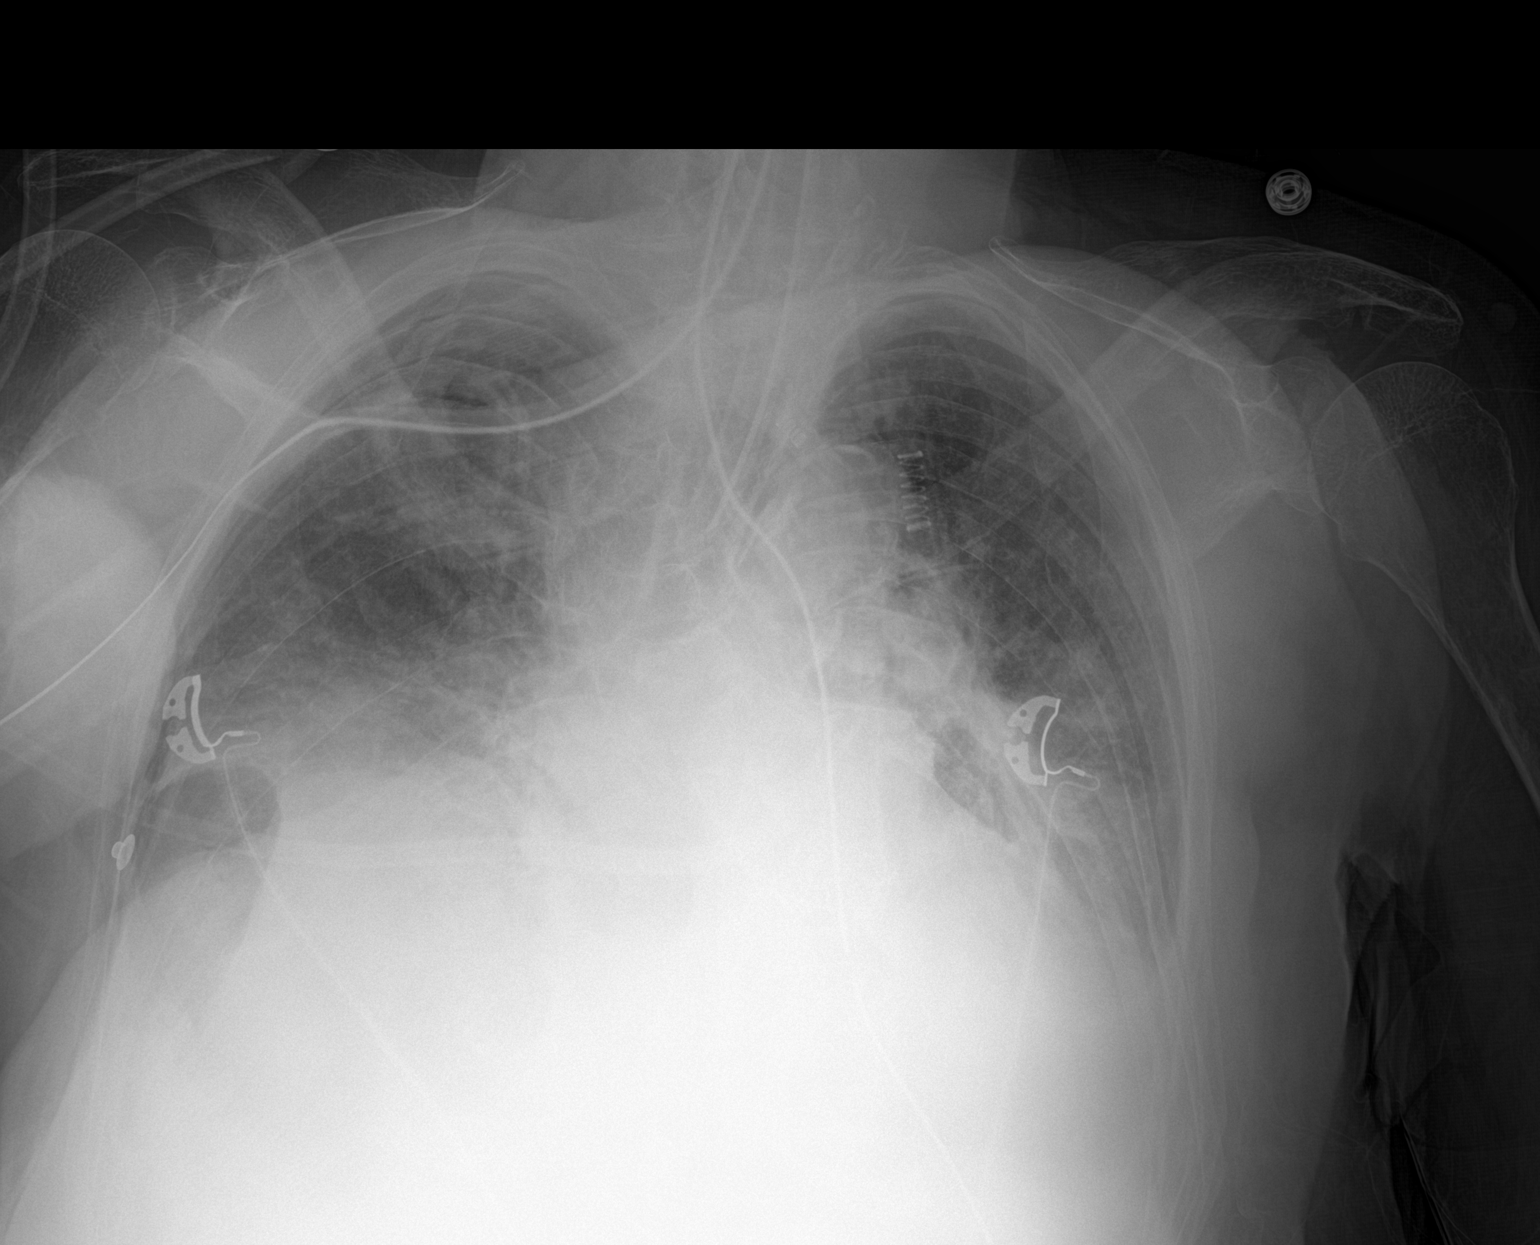

[1 of 1 positions shown; findings below may reference images not displayed]

FINDINGS: The endotracheal tube is 2.7 cm above the carina. The NG tube is
stable.

Stable cardiac enlargement, pleural effusions and bilateral lower
lobe infiltrates and atelectasis.
IMPRESSION: 1. Stable support apparatus.
2. Persistent effusions and bibasilar infiltrates/atelectasis.

## 2019-06-08 MED ORDER — POTASSIUM CHLORIDE 10 MEQ/100ML IV SOLN
10.0000 meq | INTRAVENOUS | Status: AC
  Administered 2019-06-09 (×6): 10 meq via INTRAVENOUS
  Filled 2019-06-08 (×6): qty 100

## 2019-06-08 MED ORDER — PRO-STAT SUGAR FREE PO LIQD: 30.0000 mL | Freq: Every day | ORAL | Status: DC

## 2019-06-08 MED ORDER — ORAL CARE MOUTH RINSE
15.0000 mL | Freq: Two times a day (BID) | OROMUCOSAL | Status: DC
  Administered 2019-06-08 – 2019-06-16 (×15): 15 mL via OROMUCOSAL

## 2019-06-08 MED ORDER — CHLORHEXIDINE GLUCONATE 0.12 % MT SOLN
15.0000 mL | Freq: Two times a day (BID) | OROMUCOSAL | Status: DC
  Administered 2019-06-09 – 2019-06-15 (×13): 15 mL via OROMUCOSAL
  Filled 2019-06-08 (×7): qty 15

## 2019-06-08 MED ORDER — VITAL AF 1.2 CAL PO LIQD: 1000.0000 mL | ORAL | Status: DC

## 2019-06-08 MED ORDER — DILTIAZEM HCL-DEXTROSE 125-5 MG/125ML-% IV SOLN (PREMIX)
5.0000 mg/h | INTRAVENOUS | Status: DC
  Administered 2019-06-08: 22:00:00 5 mg/h via INTRAVENOUS
  Administered 2019-06-09 – 2019-06-12 (×6): 15 mg/h via INTRAVENOUS
  Administered 2019-06-12 – 2019-06-13 (×2): 10 mg/h via INTRAVENOUS
  Filled 2019-06-08 (×12): qty 125

## 2019-06-08 MED ORDER — DEXTROSE 10 % IV SOLN: INTRAVENOUS | Status: DC

## 2019-06-08 MED ORDER — SODIUM CHLORIDE 0.9 % IV SOLN
100.0000 mg | Freq: Two times a day (BID) | INTRAVENOUS | Status: AC
  Administered 2019-06-08 – 2019-06-14 (×14): 100 mg via INTRAVENOUS
  Filled 2019-06-08 (×14): qty 100

## 2019-06-08 MED ORDER — FUROSEMIDE 10 MG/ML IJ SOLN
40.0000 mg | Freq: Four times a day (QID) | INTRAMUSCULAR | Status: AC
  Administered 2019-06-08 (×2): 40 mg via INTRAVENOUS
  Filled 2019-06-08 (×2): qty 4

## 2019-06-08 MED ORDER — MORPHINE SULFATE (PF) 2 MG/ML IV SOLN
2.0000 mg | INTRAVENOUS | Status: DC | PRN
  Administered 2019-06-08: 2 mg via INTRAVENOUS
  Administered 2019-06-08: 3 mg via INTRAVENOUS
  Administered 2019-06-09 (×2): 4 mg via INTRAVENOUS
  Administered 2019-06-10: 2 mg via INTRAVENOUS
  Administered 2019-06-11: 4 mg via INTRAVENOUS
  Administered 2019-06-11: 10:00:00 2 mg via INTRAVENOUS
  Administered 2019-06-12: 05:00:00 4 mg via INTRAVENOUS
  Administered 2019-06-12 – 2019-06-15 (×2): 2 mg via INTRAVENOUS
  Administered 2019-06-16 (×3): 4 mg via INTRAVENOUS
  Filled 2019-06-08 (×3): qty 2
  Filled 2019-06-08: qty 1
  Filled 2019-06-08: qty 2
  Filled 2019-06-08 (×3): qty 1
  Filled 2019-06-08: qty 2
  Filled 2019-06-08: qty 1
  Filled 2019-06-08 (×3): qty 2
  Filled 2019-06-08: qty 1

## 2019-06-08 MED ORDER — DOXYCYCLINE HYCLATE 100 MG PO TABS: 100.0000 mg | ORAL_TABLET | Freq: Two times a day (BID) | ORAL | Status: DC

## 2019-06-08 MED ORDER — HALOPERIDOL LACTATE 5 MG/ML IJ SOLN: 1.0000 mg | INTRAMUSCULAR | Status: DC | PRN

## 2019-06-08 MED ORDER — METOPROLOL TARTRATE 5 MG/5ML IV SOLN
2.5000 mg | Freq: Once | INTRAVENOUS | Status: AC
  Administered 2019-06-08: 2.5 mg via INTRAVENOUS
  Filled 2019-06-08: qty 5

## 2019-06-08 MED ORDER — MAGNESIUM SULFATE 2 GM/50ML IV SOLN
2.0000 g | Freq: Once | INTRAVENOUS | Status: AC
  Administered 2019-06-08: 23:00:00 2 g via INTRAVENOUS
  Filled 2019-06-08: qty 50

## 2019-06-08 NOTE — Progress Notes (Signed)
ABG obtained on the following vent settings: PRVC 456ml, rr20, 60%, +5peep.  Results for Joann Ford, Joann Ford (MRN [id number]) as of 06/08/2019 04:47  Ref. Range 06/08/2019 04:17  FIO2 Unknown 60.00  pH, Arterial Latest Ref Range: 7.350 - 7.450  7.445  pCO2 arterial Latest Ref Range: 32.0 - 48.0 mmHg 36.0  pO2, Arterial Latest Ref Range: 83.0 - 108.0 mmHg 106  Acid-Base Excess Latest Ref Range: 0.0 - 2.0 mmol/L 1.0  Bicarbonate Latest Ref Range: 20.0 - 28.0 mmol/L 24.3  O2 Saturation Latest Units: % 98.7  Patient temperature Unknown 98.6  Allens test (pass/fail) Latest Ref Range: PASS  PASS

## 2019-06-08 NOTE — Progress Notes (Signed)
Initial Nutrition Assessment  DOCUMENTATION CODES:   Not applicable  INTERVENTION:  Initiate Vital 1.2 @ 20 ml/hr, advance 10 ml every 4 hours to goal rate 50 ml/hr with 30 ml prostat via tube daily  Tube feed regimen at goal rate 50 ml/hr (1200 ml/day) provides 1540 kcal, 105 grams of protein, and 972 ml free water   NUTRITION DIAGNOSIS:   Inadequate oral intake related to inability to eat as evidenced by NPO status.    GOAL:   Provide needs based on ASPEN/SCCM guidelines    MONITOR:   Weight trends, Labs, Vent status, I & O's, TF tolerance  REASON FOR ASSESSMENT:   Consult, Ventilator Enteral/tube feeding initiation and management  ASSESSMENT:  74 year old female with past medical history of COPD, scoliosis, chronic respiratory failure with hypoxemia, restrictive lung disease due to scoliosis, HLD, atrial fibrillation, presented to [facility] ED yesterday with non-specific complaints including malaise, falls, hallucinations, and noted to be hypoxemic and recommended admission but pt left AMA. Patient brought to [facility] ED via EMS and on arrival was noted to be pulseless, underwent standard resuscitation via ACLS protocol for <5 minutes, was intubated and regained a pulse.  Patient admitted on 4/6 for cardiac arrest  Per notes: -WBC up, lactic acid up, no clear etiology with non-specific symptoms -best to treat as septic shock while working up for cardiac causes  -CT head showed mild diffuse atrophy, calcified meningioma without mass effect or edema  Mild pitting BLE edema noted per RN assessment Current wt 147.4 lbs Per history weights have fluctuated over the past 7 months, on 11/15/18 pt wt 159.72 lbs, on 01/07/19 pt wt 152.24 lbs, on 02/14/19 pt wt 150.92 lbs, on 03/15/19 pt wt 155.1 lbs, on 05/02/19 pt wt 141.68  Patient is currently intubated on ventilator support MV: 6.2 L/min Temp (24hrs), Avg:99.8 F (37.7 C), Min:98.8 F (37.1 C), Max:100.9 F (38.3  C)  Propofol: none  Medications reviewed and include: Azactam, Pepcid Precedex 1.4 mcg Labs: CBGs [phone] since admit  NUTRITION - FOCUSED PHYSICAL EXAM:  Deferred   Diet Order:   Diet Order            Diet NPO time specified  Diet effective now              EDUCATION NEEDS:   No education needs have been identified at this time  Skin:  Skin Assessment: Skin Integrity Issues: Skin Integrity Issues:: Other (Comment) Other: MASD; abdomen  Last BM:  Unknown  Height:   Ht Readings from Last 1 Encounters:  06/08/19 5\' 7"  (1.702 m)    Weight:   Wt Readings from Last 1 Encounters:  06/08/19 67 kg    Ideal Body Weight:  61.4 kg  BMI:  Body mass index is 23.13 kg/m.  Estimated Nutritional Needs:   Kcal:  1541  Protein:  100-115  Fluid:  >/= 1.5 L/day   Lajuan Lines, RD, LDN Clinical Nutrition After Hours/Weekend Pager # in [phone]

## 2019-06-08 NOTE — Progress Notes (Signed)
RN continues to reposition patient, multiples times an hour. Pt continues to slide back on her left side and legs off the bed. Pt's husband says this is the same position she stays in at home, and that is how she is most comfortable. Numerous pillows and foams used to offload pressure points

## 2019-06-08 NOTE — Progress Notes (Signed)
eLink Physician-Brief Progress Note Patient Name: Joann Ford DOB: 13-Jul-1945 MRN: [id number]   Date of Service  06/08/2019  HPI/Events of Note  AFIB with RVR - Ventricular rate = 144. BP = 142/86 with MAP = 98.  eICU Interventions  Will order: 1. Cardizem IV infusion. Titrate to HR = 65-105.  2. BMP and Mg++ level STAT.      Intervention Category Major Interventions: Arrhythmia - evaluation and management  Kavari Parrillo Eugene 06/08/2019, 9:28 PM

## 2019-06-08 NOTE — Progress Notes (Signed)
NAME:  Joann Ford, MRN:  [id number], DOB:  1945/08/26, LOS: 1 ADMISSION DATE:  06/07/2019, CONSULTATION DATE:  4/6 REFERRING MD: Karle Starch, CHIEF COMPLAINT:  Dyspnea   Brief History   74 y/o female admitted from [facility] On 4/6 in the setting of a cardiac arrest.  History of present illness   74 y/o female with COPD, scoliosis admitted on 4/6 after being brought into the [facility] and developing a cardaic arrest.  Previously in the day she presented to the [place] ED with non-specific complaints including malaise, falls, hallucintations.  Was noted to be hypoxemic, they recommended admission but she left AMA. She was brought to the [facility] ED by EMS today and on arrival was noted to be pulsess. Underwent standard resuscitation via ACLS protocol for < 5 minutes, was intubated, regained a pulse.  No clear cardiac arrhythmia noted.  Moving around, reaching for tube after arrest.  Past Medical History  COPD, followed by Dr. Lamonte Sakai Chronic respiratory failure with hypoxemia, inconsistent use of O2 Restrictive lung disease due to scoliosis Hyperlipidemia Atrial fibrillation Arthritis Anxiety  Significant Hospital Events   4/6 admission  Consults:    Procedures:  4/6 ETT >   Significant Diagnostic Tests:  4/6 CT head > mild diffuse atrophy, calcified meningioma anterior superior left frontal lobe 1.9 x 1.3 cm without mass effect or edema.  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  Patchy periventricular small vessel disease.  Micro Data:  4/6 blood >  4/6 SARS COV 2 > negative  Antimicrobials:   4/6 vanc x1 4/6 aztreonam >  4/6 azithromycin >   Interim history/subjective:   Fever overnight More awake and alert this morning  Objective   Blood pressure 126/87, pulse (!) 104, temperature 99 F (37.2 C), resp. rate (!) 29, height 5\' 7"  (1.702 m), weight 67 kg, SpO2 92 %.    Vent Mode: PSV FiO2 (%):  [40 %-100 %] 40 % Set Rate:  [20 bmp] 20 bmp Vt Set:  [490  mL] 490 mL PEEP:  [5 cmH20] 5 cmH20 Pressure Support:  [8 cmH20] 8 cmH20 Plateau Pressure:  [17 Y026551 cmH20] 21 cmH20   Intake/Output Summary (Last 24 hours) at 06/08/2019 0957 Last data filed at 06/08/2019 0848 Gross per 24 hour  Intake 2696.52 ml  Output 650 ml  Net 2046.52 ml   Filed Weights   06/08/19 0121  Weight: 67 kg    Examination:  General:  In bed on vent HENT: NCAT ETT in place PULM: CTA B, vent supported breathing CV: RRR, no mgr GI: BS+, soft, nontender MSK: normal bulk and tone Neuro: awake, following commands on vent   4/7 CXR images personally reviewed> ETT in place, bilateral air space disease left bases, RUL  Resolved Hospital Problem list     Assessment & Plan:  Cardiac arrest> etiology still uncertain, telemetry only shows atrial fibrillation Continue tele Monitor hemodynamics  Sepsis> fever overnight, bilateral infiltrates> could she have pneumonia now seen after volume resuscitation? Change antibiotics to doxycycline  Acute pulmonary edema Lasix x1 Hold fluids  Acute on chronic respiratory failure with hypoxemia, unclear cause, not wheezing on exam COPD Chronic respiratory failure with hypoxemia, non-compliant with medications Extubated this morning > immediate trouble breathing despite passing SBT on pressure support for 3 hours, suspect kyphosis/acute pulmonary edema contributing Continue brovana/pulmicort Continue duoneb prn BIPAP prn DNR  Atrial fib Tele Continue eliquis  Chronic pain Need for sedation for mechanical ventilation Stop sedation protocol Prn  morphine given baseline narcotic dependence    Best practice:  Diet: start tube feeding Pain/Anxiety/Delirium protocol (if indicated): as above VAP protocol (if indicated): as above DVT prophylaxis: eliquis GI prophylaxis: famotidine Glucose control: monitor  Mobility: bed rest Code Status: DNR Family Communication: I updated her father bedside  Disposition: admit  to ICU  Labs   CBC: Recent Labs  Lab 06/07/19 0820 06/07/19 1317 06/08/19 0622  WBC 8.5 12.7* 8.1  NEUTROABS 6.5  --   --   HGB 14.3 14.2 12.6  HCT 44.5 46.5* 38.9  MCV 96.7 103.1* 97.0  PLT 211 202 141*    Basic Metabolic Panel: Recent Labs  Lab 06/07/19 0820 06/07/19 1317 06/07/19 1920 06/08/19 0622  NA 140 139  --  138  K 4.2 4.2  --  4.0  CL 102 103  --  107  CO2 26 26  --  24  GLUCOSE 110* 144*  --  234*  BUN 14 16  --  19  CREATININE 0.77 0.90  --  0.68  CALCIUM 9.4 8.5*  --  8.2*  MG  --  2.3 1.9 1.8  PHOS  --   --  3.6 3.0   GFR: Estimated Creatinine Clearance: 60.9 mL/min (by C-G formula based on SCr of 0.68 mg/dL). Recent Labs  Lab 06/07/19 0820 06/07/19 1317 06/07/19 1521 06/07/19 1919 06/07/19 1920 06/07/19 2114 06/08/19 0622  PROCALCITON  --   --   --   --  0.14  --   --   WBC 8.5 12.7*  --   --   --   --  8.1  LATICACIDVEN  --  6.9* 1.7 2.5*  --  2.1*  --     Liver Function Tests: Recent Labs  Lab 06/07/19 0820  AST 16  ALT 13  ALKPHOS 75  BILITOT 0.8  PROT 7.1  ALBUMIN 3.7   Recent Labs  Lab 06/07/19 0820 06/07/19 1920  LIPASE 19 17  AMYLASE  --  16*   No results for input(s): AMMONIA in the last 168 hours.  ABG    Component Value Date/Time   PHART 7.445 06/08/2019 0417   PCO2ART 36.0 06/08/2019 0417   PO2ART 106 06/08/2019 0417   HCO3 24.3 06/08/2019 0417   ACIDBASEDEF 0.6 06/07/2019 1228   O2SAT 98.7 06/08/2019 0417     Coagulation Profile: No results for input(s): INR, PROTIME in the last 168 hours.  Cardiac Enzymes: No results for input(s): CKTOTAL, CKMB, CKMBINDEX, TROPONINI in the last 168 hours.  HbA1C: No results found for: HGBA1C  CBG: Recent Labs  Lab 06/07/19 1336 06/07/19 2015 06/07/19 2320 06/08/19 0323 06/08/19 0737  GLUCAP 128* 98 144* 159* 208*    Review of Systems:   Cannot obtain due to intubation  Past Medical History  She,  has a past medical history of Anxiety, Arthritis,  Atrial fibrillation ([place]), Chronic pain, Hyperlipidemia, Osteoporosis, and Scoliosis.   Surgical History    Past Surgical History:  Procedure Laterality Date  . CARDIOVERSION N/A 02/08/2018   Procedure: CARDIOVERSION;  Surgeon: Pixie Casino, MD;  Location: [facility] ENDOSCOPY;  Service: Cardiovascular;  Laterality: N/A;  . I and D of boil  1998, 1999   bilateral axilla  . TEE WITHOUT CARDIOVERSION N/A 02/08/2018   Procedure: TRANSESOPHAGEAL ECHOCARDIOGRAM (TEE);  Surgeon: Pixie Casino, MD;  Location: [facility] ENDOSCOPY;  Service: Cardiovascular;  Laterality: N/A;     Social History   reports that she has been smoking cigarettes. She started smoking  about 54 years ago. She has a 20.00 pack-year smoking history. She has never used smokeless tobacco. She reports that she does not drink alcohol or use drugs.   Family History   Her family history includes Cancer in her cousin and father; Pneumonia in her mother. There is no history of Colon cancer or Stomach cancer.   Allergies Allergies  Allergen Reactions  . Cefadroxil Anaphylaxis, Swelling and Other (See Comments)    Throat closes  . Ciprofloxacin Anaphylaxis and Other (See Comments)    Throat closes  . Penicillins Anaphylaxis and Swelling    Did it involve swelling of the face/tongue/throat, SOB, or low BP? Yes Did it involve sudden or severe rash/hives, skin peeling, or any reaction on the inside of your mouth or nose? No Did you need to seek medical attention at a hospital or doctor's office? Yes When did it last happen?"10-15 years ago" If all above answers are "NO", may proceed with cephalosporin use.   . Atorvastatin Other (See Comments)    Possible SAMS - 40 mg dose     Home Medications  Prior to Admission medications   Medication Sig Start Date End Date Taking? Authorizing Provider  albuterol (PROVENTIL) (2.5 MG/3ML) 0.083% nebulizer solution Take 3 mLs (2.5 mg total) by nebulization every 6 (six) hours as needed for wheezing  or shortness of breath. 05/18/17   Collene Gobble, MD  albuterol (VENTOLIN HFA) 108 (90 Base) MCG/ACT inhaler Inhale 2 puffs into the lungs every 6 (six) hours as needed for wheezing or shortness of breath.    [provider]  apixaban (ELIQUIS) 5 MG TABS tablet Take 1 tablet (5 mg total) by mouth 2 (two) times daily. 01/07/19   Hilty, Nadean Corwin, MD  diphenhydramine-acetaminophen (TYLENOL PM) 25-500 MG TABS tablet Take 2 tablets by mouth at bedtime as needed (for sleep).     [provider]  escitalopram (LEXAPRO) 20 MG tablet Take 1 tablet (20 mg total) by mouth daily. 09/29/11   Roma Schanz R, DO  furosemide (LASIX) 40 MG tablet TAKE 1 TABLET EVERY DAY 03/08/19   Hilty, Nadean Corwin, MD  gabapentin (NEURONTIN) 300 MG capsule Take 300-600 mg by mouth See admin instructions. Take 300 mg by mouth in the morning and 600 mg in the evening    [provider]  morphine (MS CONTIN) 15 MG 12 hr tablet Take 15 mg by mouth every 12 (twelve) hours.  04/16/18   [provider]  Tiotropium Bromide-Olodaterol (STIOLTO RESPIMAT) 2.5-2.5 MCG/ACT AERS Inhale 2 puffs into the lungs daily. 03/15/19   Collene Gobble, MD     Critical care time: 35 minutes     Roselie Awkward, MD [place] Pager: [phone] Cell: [phone] If no response, call [phone]

## 2019-06-08 NOTE — Progress Notes (Signed)
Upon arrival for the shift, patient is slouched over in bed, favoring her left side, mostly due to the scoliosis. She is on Bipap and lethargic, unable to answer many questions. When cleaning her up with the nurse tech at the start of the shift, patient states she wants [place] to "just let her just die". She was taken off the Bipap mask shortly and remained at 92% on 3L nasal cannula, but appeared still to be short of breath. Patient has been in Afib for the duration of her admission and HR has maintained in the 140s to 170s since cleaning her up. Diltiazem infusion has been started and patient is remaining on Bipap for the night. RN will continue to monitor the patient closely.

## 2019-06-08 NOTE — Progress Notes (Signed)
eLink Physician-Brief Progress Note Patient Name: Joann Ford DOB: 1945/12/02 MRN: [id number]   Date of Service  06/08/2019  HPI/Events of Note  K+ = 2.9, Mg++ = 1.7 and Creatinine = 0.64  eICU Interventions  Will replace K+ and Mg++.      Intervention Category Major Interventions: Electrolyte abnormality - evaluation and management  Aleph Nickson Eugene 06/08/2019, 10:59 PM

## 2019-06-08 NOTE — Progress Notes (Signed)
Pt appeared very uncomfortable wearing bipap.  Decision made with RN in agreement to do trial off bipap.  Pt placed back on 4lnc.  HR 136, rr26, spo2 94%.  Bipap remains in room on standby.  RT will monitor and reapply bipap as needed.

## 2019-06-08 NOTE — Progress Notes (Signed)
Notified MD that even after PRN morphine given, pt HR still elevated in the 130-150s. Verbal order for 2.5 metoprolol IV

## 2019-06-08 NOTE — TOC Progression Note (Signed)
Transition of Care [facility]) - Progression Note    Patient Details  Name: Joann Ford MRN: [id number] Date of Birth: 10-12-1945  Transition of Care [facility]) CM/SW Contact  Leeroy Cha, RN Phone Number: 06/08/2019, 8:59 AM  Clinical Narrative:    [id number]/tct-Larry Fitchett the husband/ at [phone].  He is the POA at this time.  Asked if she wanted to be held on lifesupport and the answer was no.  Did state to the husband that one of the MD's would be calling him to updated him on her condition and to get her wishes for code status.        Expected Discharge Plan and Services                                                 Social Determinants of Health (SDOH) Interventions    Readmission Risk Interventions No flowsheet data found.

## 2019-06-08 NOTE — Progress Notes (Signed)
Notified CCM MD in regards to pt having increased HR, increased agitation, and trending down CBGs.   D10 ordered and started and Morphine given

## 2019-06-08 NOTE — Progress Notes (Signed)
Patient is on precedex gtt and still highly agitated and attempting to come out of bed and pull tubes when awoken by alarm pump beeps or voices. Patient receiving Versed pushes to help maintain RASS of -2. At shift change, pts temperature (per temp foley) was 37.5 C. As of now, her temp is 38.4 C. Ice packs applied, Tylenol given, and covers removed. [place] RN notified. RN will continue to monitor patient.

## 2019-06-08 NOTE — Procedures (Signed)
Extubation Procedure Note  Patient Details:   Name: KAMBRIE STOLTENBERG DOB: 1945-03-17 MRN: [id number]   Airway Documentation:    Vent end date: 06/08/19 Vent end time: 1022   Evaluation  O2 sats: stable throughout Complications: No apparent complications Patient did tolerate procedure well. Bilateral Breath Sounds: Clear, Diminished   Yes  Tamera Reason 06/08/2019, 10:31 AM

## 2019-06-08 NOTE — Progress Notes (Addendum)
SLP Cancellation Note  Patient Details Name: Joann Ford MRN: [id number] DOB: 24-May-1945   Cancelled treatment:       Reason Eval/Treat Not Completed: Medical issues which prohibited therapy(per RN, Mel Almond, pt is not ready for swallow evaluation at this time, will continue efforts next date)   Macario Golds 06/08/2019, 2:10 PM    Kathleen Lime, MS [place] Office [phone]

## 2019-06-09 ENCOUNTER — Other Ambulatory Visit (HOSPITAL_COMMUNITY): Payer: Medicare HMO

## 2019-06-09 ENCOUNTER — Inpatient Hospital Stay (HOSPITAL_COMMUNITY): Payer: Medicare HMO

## 2019-06-09 DIAGNOSIS — I4891 Unspecified atrial fibrillation: Secondary | ICD-10-CM

## 2019-06-09 DIAGNOSIS — R0902 Hypoxemia: Secondary | ICD-10-CM

## 2019-06-09 DIAGNOSIS — R079 Chest pain, unspecified: Secondary | ICD-10-CM

## 2019-06-09 DIAGNOSIS — R7989 Other specified abnormal findings of blood chemistry: Secondary | ICD-10-CM

## 2019-06-09 LAB — BASIC METABOLIC PANEL
Anion gap: 7 (ref 5–15)
BUN: 12 mg/dL (ref 8–23)
CO2: 28 mmol/L (ref 22–32)
Calcium: 8.8 mg/dL — ABNORMAL LOW (ref 8.9–10.3)
Chloride: 105 mmol/L (ref 98–111)
Creatinine, Ser: 0.6 mg/dL (ref 0.44–1.00)
GFR calc Af Amer: >60 mL/min (ref >60)
GFR calc non Af Amer: >60 mL/min (ref >60)
Glucose, Bld: 137 mg/dL — ABNORMAL HIGH (ref 70–99)
Potassium: 3.1 mmol/L — ABNORMAL LOW (ref 3.5–5.1)
Sodium: 140 mmol/L (ref 135–145)

## 2019-06-09 LAB — COMPREHENSIVE METABOLIC PANEL
ALT: 46 U/L — ABNORMAL HIGH (ref 0–44)
ALT: 44 U/L (ref 0–44)
AST: 47 U/L — ABNORMAL HIGH (ref 15–41)
AST: 38 U/L (ref 15–41)
Albumin: 2.9 g/dL — ABNORMAL LOW (ref 3.5–5.0)
Albumin: 3.1 g/dL — ABNORMAL LOW (ref 3.5–5.0)
Alkaline Phosphatase: 61 U/L (ref 38–126)
Alkaline Phosphatase: 68 U/L (ref 38–126)
Anion gap: 10 (ref 5–15)
Anion gap: 7 (ref 5–15)
BUN: 15 mg/dL (ref 8–23)
BUN: 10 mg/dL (ref 8–23)
CO2: 28 mmol/L (ref 22–32)
CO2: 32 mmol/L (ref 22–32)
Calcium: 8.3 mg/dL — ABNORMAL LOW (ref 8.9–10.3)
Calcium: 8.5 mg/dL — ABNORMAL LOW (ref 8.9–10.3)
Chloride: 105 mmol/L (ref 98–111)
Chloride: 100 mmol/L (ref 98–111)
Creatinine, Ser: 0.54 mg/dL (ref 0.44–1.00)
Creatinine, Ser: 0.56 mg/dL (ref 0.44–1.00)
GFR calc Af Amer: >60 mL/min (ref >60)
GFR calc Af Amer: >60 mL/min (ref >60)
GFR calc non Af Amer: >60 mL/min (ref >60)
GFR calc non Af Amer: >60 mL/min (ref >60)
Glucose, Bld: 125 mg/dL — ABNORMAL HIGH (ref 70–99)
Glucose, Bld: 131 mg/dL — ABNORMAL HIGH (ref 70–99)
Potassium: 3.3 mmol/L — ABNORMAL LOW (ref 3.5–5.1)
Potassium: 3 mmol/L — ABNORMAL LOW (ref 3.5–5.1)
Sodium: 143 mmol/L (ref 135–145)
Sodium: 139 mmol/L (ref 135–145)
Total Bilirubin: 1 mg/dL (ref 0.3–1.2)
Total Bilirubin: 0.9 mg/dL (ref 0.3–1.2)
Total Protein: 6.2 g/dL — ABNORMAL LOW (ref 6.5–8.1)
Total Protein: 6.6 g/dL (ref 6.5–8.1)

## 2019-06-09 LAB — GLUCOSE, CAPILLARY: Glucose-Capillary: 119 mg/dL — ABNORMAL HIGH (ref 70–99)

## 2019-06-09 LAB — CBC WITH DIFFERENTIAL/PLATELET
Abs Immature Granulocytes: 0.08 10*3/uL — ABNORMAL HIGH (ref 0.00–0.07)
Basophils Absolute: 0 10*3/uL (ref 0.0–0.1)
Basophils Relative: 0 %
Eosinophils Absolute: 0 10*3/uL (ref 0.0–0.5)
Eosinophils Relative: 0 %
HCT: 40.8 % (ref 36.0–46.0)
Hemoglobin: 13.4 g/dL (ref 12.0–15.0)
Immature Granulocytes: 1 %
Lymphocytes Relative: 6 %
Lymphs Abs: 0.8 10*3/uL (ref 0.7–4.0)
MCH: 31.1 pg (ref 26.0–34.0)
MCHC: 32.8 g/dL (ref 30.0–36.0)
MCV: 94.7 fL (ref 80.0–100.0)
Monocytes Absolute: 1.1 10*3/uL — ABNORMAL HIGH (ref 0.1–1.0)
Monocytes Relative: 8 %
Neutro Abs: 11.1 10*3/uL — ABNORMAL HIGH (ref 1.7–7.7)
Neutrophils Relative %: 85 %
Platelets: 147 10*3/uL — ABNORMAL LOW (ref 150–400)
RBC: 4.31 MIL/uL (ref 3.87–5.11)
RDW: 14.2 % (ref 11.5–15.5)
WBC: 13 10*3/uL — ABNORMAL HIGH (ref 4.0–10.5)
nRBC: 0 % (ref 0.0–0.2)

## 2019-06-09 LAB — MAGNESIUM: Magnesium: 2 mg/dL (ref 1.7–2.4)

## 2019-06-09 LAB — TSH: TSH: 0.698 u[IU]/mL (ref 0.350–4.500)

## 2019-06-09 MED ORDER — LEVALBUTEROL HCL 0.63 MG/3ML IN NEBU: 0.6300 mg | INHALATION_SOLUTION | RESPIRATORY_TRACT | Status: DC | PRN

## 2019-06-09 MED ORDER — SODIUM CHLORIDE (PF) 0.9 % IJ SOLN
INTRAMUSCULAR | Status: AC
  Filled 2019-06-09: qty 50

## 2019-06-09 MED ORDER — AMIODARONE HCL IN DEXTROSE 360-4.14 MG/200ML-% IV SOLN
60.0000 mg/h | INTRAVENOUS | Status: DC
  Administered 2019-06-09 (×2): 60 mg/h via INTRAVENOUS
  Filled 2019-06-09 (×2): qty 200

## 2019-06-09 MED ORDER — IOHEXOL 9 MG/ML PO SOLN
ORAL | Status: AC
  Filled 2019-06-09: qty 1000

## 2019-06-09 MED ORDER — AMIODARONE HCL IN DEXTROSE 360-4.14 MG/200ML-% IV SOLN
30.0000 mg/h | INTRAVENOUS | Status: DC
  Administered 2019-06-09 – 2019-06-13 (×10): 30 mg/h via INTRAVENOUS
  Filled 2019-06-09 (×9): qty 200

## 2019-06-09 MED ORDER — AMIODARONE LOAD VIA INFUSION
150.0000 mg | Freq: Once | INTRAVENOUS | Status: AC
  Administered 2019-06-09: 150 mg via INTRAVENOUS
  Filled 2019-06-09: qty 83.34

## 2019-06-09 MED ORDER — FUROSEMIDE 10 MG/ML IJ SOLN
40.0000 mg | Freq: Four times a day (QID) | INTRAMUSCULAR | Status: AC
  Administered 2019-06-09 (×2): 40 mg via INTRAVENOUS
  Filled 2019-06-09 (×2): qty 4

## 2019-06-09 MED ORDER — IOHEXOL 350 MG/ML SOLN
100.0000 mL | Freq: Once | INTRAVENOUS | Status: AC | PRN
  Administered 2019-06-09: 16:00:00 80 mL via INTRAVENOUS

## 2019-06-09 MED ORDER — POTASSIUM CHLORIDE 10 MEQ/100ML IV SOLN
10.0000 meq | INTRAVENOUS | Status: AC
  Administered 2019-06-09 (×6): 10 meq via INTRAVENOUS
  Filled 2019-06-09 (×6): qty 100

## 2019-06-09 MED ORDER — IOHEXOL 9 MG/ML PO SOLN: 500.0000 mL | ORAL | Status: AC

## 2019-06-09 MED ORDER — AMIODARONE IV BOLUS ONLY 150 MG/100ML
150.0000 mg | Freq: Once | INTRAVENOUS | Status: AC
  Administered 2019-06-09: 15:00:00 150 mg via INTRAVENOUS

## 2019-06-09 MED ORDER — METOPROLOL TARTRATE 25 MG PO TABS
25.0000 mg | ORAL_TABLET | Freq: Two times a day (BID) | ORAL | Status: DC
  Administered 2019-06-09 (×2): 25 mg via ORAL
  Filled 2019-06-09 (×2): qty 1

## 2019-06-09 MED ORDER — SODIUM CHLORIDE 0.9% FLUSH: 10.0000 mL | INTRAVENOUS | Status: DC | PRN

## 2019-06-09 NOTE — Progress Notes (Signed)
eLink Physician-Brief Progress Note Patient Name: REGEINA MENDEN DOB: 03/31/1945 MRN: [id number]   Date of Service  06/09/2019  HPI/Events of Note  Review of portable CXR reveals: 1. Status post extubation. 2. Persistent low lung volumes with bibasilar atelectasis and pleural effusions, similar to prior study. Nursing says that the patient can't tolerate BiPAP which would be the logical next step.  eICU Interventions  Will order: 1. Incentive spirometry Q 1 hour whilc awake.  2. BiPAP as tolerated.      Intervention Category Major Interventions: Hypoxemia - evaluation and management  [place] Shannahan Eugene 06/09/2019, 2:36 AM

## 2019-06-09 NOTE — Evaluation (Signed)
Occupational Therapy Evaluation Patient Details Name: Joann Ford MRN: [id number] DOB: [birth year] Today's Date: 06/09/2019    History of Present Illness 74 y/o female with PMHx significant for COPD, scoliosis, HLD, HTN and admitted on 06/07/19 after being brought into the [facility] and developing a cardiac arrest.  Previously in the day she presented to the [place] ED with non-specific complaints including malaise, falls, hallucintations.  Was noted to be hypoxemic, they recommended admission but she left AMA. She was brought to the [facility] ED by EMS today and on arrival was noted to be pulsess. Underwent standard resuscitation via ACLS protocol for < 5 minutes, was intubated, regained a pulse.  No clear cardiac arrhythmia noted.  ETT 4/6-06/08/19   Clinical Impression   PTA patient reports using RW for mobility and completing ADLs independently. Patient admitted for above and limited by problem list below, including impaired balance, decreased activity tolerance, impaired cognition, and generalized weakness.  Patient on 3L supplemental O2 via [place] during session with SpO2 ranging from 89-91%, HR ranged from 120s up to 180 (after transfer to recliner)--RN aware.  Patient requires min-total assist for ADLs, mod assist +2 for transfer to recliner (for comfort and repositioning).  She is oriented to self, but reoriented to place during session; mild confusion, follows simple commands with increased time and poor problem solving/awareness. She will benefit from further OT services while admitted after after dc at SNF level to optimize independence and safety with ADLs/mobility. Will follow acutely.     Follow Up Recommendations  SNF;Supervision/Assistance - 24 hour    Equipment Recommendations  Other (comment)(TBD at next venue of care )    Recommendations for Other Services       Precautions / Restrictions Precautions Precautions: Fall Precaution Comments: monitor vitals; severe  scoliosis Restrictions Weight Bearing Restrictions: No      Mobility Bed Mobility Overal bed mobility: Needs Assistance Bed Mobility: Supine to Sit     Supine to sit: Min assist;HOB elevated     General bed mobility comments: pt assisting with UEs, slight assist for scooting to EOB, utilized bed pad  Transfers Overall transfer level: Needs assistance Equipment used: Rolling walker (2 wheeled) Transfers: Sit to/from [facility] Sit to Stand: Mod assist;+2 physical assistance Stand pivot transfers: Mod assist;Min assist;+2 physical assistance       General transfer comment: multimodal cues for technique, assist due to weakness, HR up to 180 bpm during transfer (RN notified)    Balance Overall balance assessment: Needs assistance Sitting-balance support: No upper extremity supported;Feet supported Sitting balance-Leahy Scale: Fair Sitting balance - Comments: min guard statically   Standing balance support: Bilateral upper extremity supported;During functional activity Standing balance-Leahy Scale: Poor Standing balance comment: reliant on BUE and external support                           ADL either performed or assessed with clinical judgement   ADL Overall ADL's : Needs assistance/impaired     Grooming: Minimal assistance;Sitting   Upper Body Bathing: Minimal assistance;Sitting   Lower Body Bathing: +2 for physical assistance;+2 for safety/equipment;Total assistance;Sit to/from stand   Upper Body Dressing : Minimal assistance;Sitting   Lower Body Dressing: Total assistance;+2 for physical assistance;+2 for safety/equipment;Sit to/from stand   Toilet Transfer: Moderate assistance;+2 for physical assistance;+2 for safety/equipment;Stand-pivot Toilet Transfer Details (indicate cue type and reason): simulated to recliner          Functional mobility during  ADLs: Moderate assistance;+2 for physical assistance;+2 for  safety/equipment;Rolling walker;Cueing for safety;Cueing for sequencing General ADL Comments: pt limited by impaired cognition, decreased activity tolerance, impaired balance and weakness      Vision   Vision Assessment?: No apparent visual deficits     Perception     Praxis      Pertinent Vitals/Pain Pain Assessment: Faces Faces Pain Scale: Hurts a little bit Pain Location: L UE  Pain Descriptors / Indicators: Discomfort Pain Intervention(s): Limited activity within patient's tolerance;Monitored during session;Premedicated before session;Repositioned     Hand Dominance Right   Extremity/Trunk Assessment Upper Extremity Assessment Upper Extremity Assessment: Generalized weakness   Lower Extremity Assessment Lower Extremity Assessment: Defer to PT evaluation   Cervical / Trunk Assessment Cervical / Trunk Assessment: Kyphotic;Other exceptions Cervical / Trunk Exceptions: severe scoliosis   Communication Communication Communication: No difficulties   Cognition Arousal/Alertness: Awake/alert Behavior During Therapy: Flat affect Overall Cognitive Status: No family/caregiver present to determine baseline cognitive functioning Area of Impairment: Orientation;Attention;Memory;Following commands;Safety/judgement;Awareness;Problem solving                 Orientation Level: Disoriented to;Time;Situation Current Attention Level: Sustained Memory: Decreased short-term memory Following Commands: Follows one step commands consistently;Follows one step commands with increased time;Follows multi-step commands inconsistently Safety/Judgement: Decreased awareness of safety;Decreased awareness of deficits Awareness: Intellectual Problem Solving: Slow processing;Decreased initiation;Requires verbal cues;Difficulty sequencing General Comments: pt disoriented to time and situation, follows simple commands with increased time but poor recall, task attention, and problem  sovling/sequencing    General Comments  BP stable, HR ranged from 120 (at rest) up to 180 during transfer, coming back down to 142 after transfer-- RN aware.  On 3L O2 via [place] with Spo2 89-91%     Exercises     Shoulder Instructions      Home Living Family/patient expects to be discharged to:: Private residence Living Arrangements: Spouse/significant other Available Help at Discharge: Family;Available 24 hours/day Type of Home: House Home Access: Stairs to enter [employer] of Steps: 1 Entrance Stairs-Rails: Left Home Layout: One level     Bathroom Shower/Tub: Occupational psychologist: Standard     Home Equipment: Environmental consultant - 2 wheels;Walker - 4 wheels;Bedside commode          Prior Functioning/Environment Level of Independence: Independent with assistive device(s)        Comments: Retired Theme park manager; uses walker; reports independent ADLs         OT Problem List: Decreased strength;Decreased activity tolerance;Impaired balance (sitting and/or standing);Decreased coordination;Decreased cognition;Decreased safety awareness;Decreased knowledge of use of DME or AE;Decreased knowledge of precautions;Cardiopulmonary status limiting activity      OT Treatment/Interventions: Self-care/ADL training;Therapeutic exercise;DME and/or AE instruction;Therapeutic activities;Patient/family education;Balance training;Cognitive remediation/compensation;Energy conservation    OT Goals(Current goals can be found in the care plan section) Acute Rehab OT Goals Patient Stated Goal: feel better OT Goal Formulation: With patient Time For Goal Achievement: 06/23/19 Potential to Achieve Goals: Good  OT Frequency: Min 2X/week   Barriers to D/C:            Co-evaluation PT/OT/SLP Co-Evaluation/Treatment: Yes Reason for Co-Treatment: For patient/therapist safety;To address functional/ADL transfers PT goals addressed during session: Mobility/safety with mobility OT goals  addressed during session: ADL's and self-care      AM-PAC OT "6 Clicks" Daily Activity     Outcome Measure Help from another person eating meals?: Total Help from another person taking care of personal grooming?: A Little Help from another person toileting, which includes using  toliet, bedpan, or urinal?: A Lot Help from another person bathing (including washing, rinsing, drying)?: A Lot Help from another person to put on and taking off regular upper body clothing?: A Little Help from another person to put on and taking off regular lower body clothing?: Total 6 Click Score: 12   End of Session Equipment Utilized During Treatment: Rolling walker;Oxygen(3L) Nurse Communication: Mobility status;Precautions  Activity Tolerance: Patient tolerated treatment well Patient left: in chair;with call bell/phone within reach;with chair alarm set;with nursing/sitter in room  OT Visit Diagnosis: Other abnormalities of gait and mobility (R26.89);Muscle weakness (generalized) (M62.81);Other symptoms and signs involving cognitive function                Time: 1140-1203 OT Time Calculation (min): 23 min Charges:  OT General Charges $OT Visit: 1 Visit OT Evaluation $OT Eval Moderate Complexity: 1 Mod  Jolaine Artist, OT Acute Rehabilitation Services Pager [phone] Office [phone]    Delight Stare 06/09/2019, 1:37 PM

## 2019-06-09 NOTE — Progress Notes (Signed)
Echo attempted. HR too high at this time.

## 2019-06-09 NOTE — Consult Note (Signed)
Cardiology Consultation:   Patient ID: Joann Ford MRN: [id number]; DOB: [birth year]  Admit date: 06/07/2019 Date of Consult: 06/09/2019  Primary Care Provider: Ardith Dark, PA-C Primary Cardiologist: Pixie Casino, MD  Primary Electrophysiologist:  None    Patient Profile:   Joann Ford is a 74 y.o. female with a hx of COPD with O2 use at home, restrictive lung disease due to scoliosis, mild aortic stenosis, coronary artery calcification seen on CT 01/2019, hyperlipidemia, atrial fibrillation s/p failed cardioversion x 3 on Eliquis, anxiety, arthritis, marked thoracic kyphosis who is being seen today for the evaluation of cardiac arrest at the request of Dr. Lake Ford.  History of Present Illness:   Joann Ford followed by Dr. Debara Ford for the above cardiac issues. Patient had a Lexiscan Myoview in 2019 as well as an echo for evaluation of murmur for cardiac risk assessment for spine surgery.  Echocardiogram showed normal LVEF of 55 to 60% with mild stenosis of the aortic valve.  Myoview stress test was low risk however there was a small area of apical inferior and apical lateral ischemia.  Plan to treat this medically considering patient was asymptomatic. Patient was started on a statin for an LDL of 123 but this was discontinued after she developed joint pains.   Unfortunately patient was not able to be operated on with regards to spine straining. Patient was last seen 02/14/2019 for hospital follow-up for atrial fibrillation, rib fracture, and respiratory failure. Metoprolol has been added to her regimen.  In the office she was hypotensive. Diltiazem was decreased.  Patient was admitted on 06/07/19 to [facility] long hospital cardiac arrest.  The day before she was seen at [facility] ED with complaints of malaise, falls, hallucinations. Work up was essentially unremarkable. She was noted to be hypoxic and admission was recommended but patient left AMA.  EMS was called to the house  again and patient was alert and oriented. EMS brought the patient to ED where she was somewhat aggressive. She then became bradycardic and unresponsive. She was found to be pulseless and underwent CPR/ACLS for less than 5 minutes, was intubated and regained pulses. No clear cardiac arrhythmia preeceding or following ROSC.   Past Medical History:  Diagnosis Date  . Anxiety   . Arthritis   . Atrial fibrillation ([place])   . Chronic pain   . Hyperlipidemia   . Osteoporosis   . Scoliosis     Past Surgical History:  Procedure Laterality Date  . CARDIOVERSION N/A 02/08/2018   Procedure: CARDIOVERSION;  Surgeon: Pixie Casino, MD;  Location[facility] ENDOSCOPY;  Service: Cardiovascular;  Laterality: N/A;  . I and D of boil  1998, 1999   bilateral axilla  . TEE WITHOUT CARDIOVERSION N/A 02/08/2018   Procedure: TRANSESOPHAGEAL ECHOCARDIOGRAM (TEE);  Surgeon: Pixie Casino, MD;  Location: [facility] ENDOSCOPY;  Service: Cardiovascular;  Laterality: N/A;     Home Medications:  Prior to Admission medications   Medication Sig Start Date End Date Taking? Authorizing Provider  albuterol (VENTOLIN HFA) 108 (90 Base) MCG/ACT inhaler Inhale 2 puffs into the lungs every 6 (six) hours as needed for wheezing or shortness of breath.   Yes [provider]  apixaban (ELIQUIS) 5 MG TABS tablet Take 1 tablet (5 mg total) by mouth 2 (two) times daily. 01/07/19  Yes Hilty, Nadean Corwin, MD  diltiazem (CARDIZEM CD) 120 MG 24 hr capsule Take 120 mg by mouth daily.   Yes [provider]  escitalopram (LEXAPRO) 20  MG tablet Take 1 tablet (20 mg total) by mouth daily. 09/29/11  Yes Roma Schanz R, DO  furosemide (LASIX) 40 MG tablet TAKE 1 TABLET EVERY DAY Patient taking differently: Take 40 mg by mouth 2 (two) times daily.  03/08/19  Yes Hilty, Nadean Corwin, MD  gabapentin (NEURONTIN) 300 MG capsule Take 300-600 mg by mouth See admin instructions. Take 300 mg by mouth in the morning and 600 mg in the evening    Yes [provider]  metoprolol tartrate (LOPRESSOR) 25 MG tablet Take 25 mg by mouth 2 (two) times daily.   Yes [provider]  morphine (MS CONTIN) 15 MG 12 hr tablet Take 15 mg by mouth every 12 (twelve) hours.  04/16/18  Yes [provider]  rosuvastatin (CRESTOR) 5 MG tablet Take 5 mg by mouth every other day.    Yes [provider]  Tiotropium Bromide-Olodaterol (STIOLTO RESPIMAT) 2.5-2.5 MCG/ACT AERS Inhale 2 puffs into the lungs daily. 03/15/19  Yes Collene Gobble, MD  albuterol (PROVENTIL) (2.5 MG/3ML) 0.083% nebulizer solution Take 3 mLs (2.5 mg total) by nebulization every 6 (six) hours as needed for wheezing or shortness of breath. Patient not taking: Reported on 06/07/2019 05/18/17   Collene Gobble, MD    Inpatient Medications: Scheduled Meds: . apixaban  5 mg Oral BID  . arformoterol  15 mcg Nebulization BID  . budesonide (PULMICORT) nebulizer solution  0.5 mg Nebulization BID  . chlorhexidine  15 mL Mouth Rinse BID  . Chlorhexidine Gluconate Cloth  6 each Topical Daily  . furosemide  40 mg Intravenous Q6H  . mouth rinse  15 mL Mouth Rinse q12n4p   Continuous Infusions: . amiodarone 30 mg/hr (06/09/19 1200)  . dextrose 50 mL/hr at 06/09/19 1200  . diltiazem (CARDIZEM) infusion 15 mg/hr (06/09/19 1200)  . doxycycline (VIBRAMYCIN) IV Stopped (06/09/19 1157)   PRN Meds: acetaminophen, haloperidol lactate, ipratropium-albuterol, levalbuterol, morphine injection, ondansetron (ZOFRAN) IV, sodium chloride flush  Allergies:    Allergies  Allergen Reactions  . Cefadroxil Anaphylaxis, Swelling and Other (See Comments)    Throat closes  . Ciprofloxacin Anaphylaxis and Other (See Comments)    Throat closes  . Penicillins Anaphylaxis and Swelling    Did it involve swelling of the face/tongue/throat, SOB, or low BP? Yes Did it involve sudden or severe rash/hives, skin peeling, or any reaction on the inside of your mouth or nose? No Did you need  to seek medical attention at a hospital or doctor's office? Yes When did it last happen?"10-15 years ago" If all above answers are "NO", may proceed with cephalosporin use.   . Atorvastatin Other (See Comments)    Possible SAMS - 40 mg dose    Social History:   Social History   Socioeconomic History  . Marital status: Married    Spouse name: Not on file  . Number of children: Not on file  . Years of education: Not on file  . Highest education level: Not on file  Occupational History  . Occupation: hair dresser  Tobacco Use  . Smoking status: Current Every Day Smoker    Packs/day: 0.50    Years: 40.00    Pack years: 20.00    Types: Cigarettes    Start date: 03/15/1965  . Smokeless tobacco: Never Used  Substance and Sexual Activity  . Alcohol use: No  . Drug use: No  . Sexual activity: Not on file  Other Topics Concern  . Not on file  Social  History Narrative   Exercise--  walk   Social Determinants of Health   Financial Resource Strain:   . Difficulty of Paying Living Expenses:   Food Insecurity:   . Worried About Charity fundraiser in the Last Year:   . Arboriculturist in the Last Year:   Transportation Needs:   . Film/video editor (Medical):   Marland Kitchen Lack of Transportation (Non-Medical):   Physical Activity:   . Days of Exercise per Week:   . Minutes of Exercise per Session:   Stress:   . Feeling of Stress :   Social Connections:   . Frequency of Communication with Friends and Family:   . Frequency of Social Gatherings with Friends and Family:   . Attends Religious Services:   . Active Member of Clubs or Organizations:   . Attends Archivist Meetings:   Marland Kitchen Marital Status:   Intimate Partner Violence:   . Fear of Current or Ex-Partner:   . Emotionally Abused:   Marland Kitchen Physically Abused:   . Sexually Abused:     Family History:   Family History  Problem Relation Age of Onset  . Cancer Father        Jaw Cancer  . Pneumonia Mother   . Cancer  Cousin        jaw  . Colon cancer Neg Hx   . Stomach cancer Neg Hx      ROS:  Please see the history of present illness.  All other ROS reviewed and negative.     Physical Exam/Data:   Vitals:   06/09/19 0912 06/09/19 1000 06/09/19 1100 06/09/19 1200  BP:  (!) 121/56 135/76 133/65  Pulse:  (!) 126 (!) 130 (!) 135  Resp:  (!) 23 (!) 24 (!) 30  Temp:  99.3 F (37.4 C) 99.7 F (37.6 C) 99.7 F (37.6 C)  TempSrc:      SpO2: 92% 92% 93% 90%  Weight:      Height:        Intake/Output Summary (Last 24 hours) at 06/09/2019 1327 Last data filed at 06/09/2019 1200 Gross per 24 hour  Intake 3000.56 ml  Output 6225 ml  Net -3224.44 ml   Last 3 Weights 06/08/2019 06/07/2019 05/02/2019  Weight (lbs) 147 lb 11.3 oz 147 lb 9.6 oz 142 lb  Weight (kg) 67 kg 66.951 kg 64.411 kg     Body mass index is 23.13 kg/m.  General:  Well nourished, well developed, in no acute distress HEENT: normal Lymph: no adenopathy Neck: no JVD Endocrine:  No thryomegaly Vascular: No carotid bruits; FA pulses 2+ bilaterally without bruits  Cardiac:  normal S1, S2; RRR; no murmur  Lungs:  Crackles at bases  Abd: soft, nontender, no hepatomegaly  Ext: no edema Musculoskeletal:  No deformities, BUE and BLE strength normal and equal Skin: warm and dry  Neuro:  CNs 2-12 intact, no focal abnormalities noted Psych:  Normal affect   EKG:  The EKG was personally reviewed and demonstrates:  Afib RVR, 113 bpm Telemetry:  Telemetry was personally reviewed and demonstrates:  Afib RVR, 120-140bpm, multiple runs of NSVT, 9 beats the longest  Relevant CV Studies:  Echo 2019 Study Conclusions   - Left ventricle: The cavity size was normal. Wall thickness was  increased in a pattern of moderate LVH. Systolic function was  normal. The estimated ejection fraction was in the range of 55%  to 60%. Wall motion was normal; there were no regional wall  motion abnormalities. Left ventricular diastolic function    parameters were normal.  - Aortic valve: There was mild stenosis. There was trivial  regurgitation.  - Left atrium: The atrium was mildly dilated.  - Atrial septum: No defect or patent foramen ovale was identified.    Lexiscan stress test 2019  The left ventricular ejection fraction is normal (55-65%).  Nuclear stress EF: 61%.  There was no ST segment deviation noted during stress.  Defect 1: There is a small defect of severe severity present in the apical inferior, apical lateral and apex location.   Abnormal, low risk stress nuclear study with mild ischemia in the distal inferolateral wall/apex; EF 61 with normal wall motion.   Laboratory Data:  High Sensitivity Troponin:   Recent Labs  Lab 06/07/19 1317  TROPONINIHS 9     Chemistry Recent Labs  Lab 06/08/19 2138 06/09/19 0246 06/09/19 0844  NA 141 143 140  K 2.9* 3.3* 3.1*  CL 105 105 105  CO2 28 28 28   GLUCOSE 109* 125* 137*  BUN 17 15 12   CREATININE 0.64 0.54 0.60  CALCIUM 8.0* 8.3* 8.8*  GFRNONAA >60 >60 >60  GFRAA >60 >60 >60  ANIONGAP 8 10 7     Recent Labs  Lab 06/07/19 0820 06/09/19 0246  PROT 7.1 6.2*  ALBUMIN 3.7 2.9*  AST 16 47*  ALT 13 46*  ALKPHOS 75 61  BILITOT 0.8 1.0   Hematology Recent Labs  Lab 06/07/19 1317 06/08/19 0622 06/09/19 0246  WBC 12.7* 8.1 13.0*  RBC 4.51 4.01 4.31  HGB 14.2 12.6 13.4  HCT 46.5* 38.9 40.8  MCV 103.1* 97.0 94.7  MCH 31.5 31.4 31.1  MCHC 30.5 32.4 32.8  RDW 14.0 14.0 14.2  PLT 202 141* 147*   BNP Recent Labs  Lab 06/07/19 1317  BNP 1,296.5*    DDimer  Recent Labs  Lab 06/07/19 1920  DDIMER 3.74*     Radiology/Studies:  DG Chest 2 View  Result Date: 06/07/2019 CLINICAL DATA:  Frequent falls with chest pain, initial encounter EXAM: CHEST - 2 VIEW COMPARISON:  01/21/2019 FINDINGS: Cardiac shadow is enlarged but stable. Aortic calcifications are again seen. Elevation of the right hemidiaphragm is again noted with compressive  atelectasis chronic in appearance. No acute infiltrate is seen. No sizable effusion is noted. Diffuse osteopenia is seen. IMPRESSION: Elevated right hemidiaphragm with compressive atelectasis stable from the prior exam. Electronically Signed   By: Inez Catalina M.D.   On: 06/07/2019 09:24   DG Pelvis 1-2 Views  Result Date: 06/07/2019 CLINICAL DATA:  Recent falls EXAM: PELVIS - 1-2 VIEW COMPARISON:  None. FINDINGS: Pelvic ring is intact. No acute fracture or dislocation is noted. Degenerative changes of the hip joints are noted right greater than left. Degenerative changes of the lumbar spine are seen as well. No soft tissue abnormality is noted. IMPRESSION: Degenerative change without acute abnormality. Electronically Signed   By: Inez Catalina M.D.   On: 06/07/2019 09:24   CT Head Wo Contrast  Result Date: 06/07/2019 CLINICAL DATA:  Confusion and memory loss EXAM: CT HEAD WITHOUT CONTRAST TECHNIQUE: Contiguous axial images were obtained from the base of the skull through the vertex without intravenous contrast. COMPARISON:  None. FINDINGS: Brain: There is mild diffuse atrophy. There is a focal calcification arising from the dura in the superior left frontal lobe region anteriorly measuring 1.9 x 1.3 cm without surrounding edema or mass effect. This calcified meningioma is not felt to have clinical significance. No other  evident mass. There is no hemorrhage, extra-axial fluid collection, or midline shift. 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 There is mild patchy periventricular small vessel disease in the centra semiovale bilaterally. No acute appearing infarct evident. Vascular: No hyperdense vessels evident. There is calcification in each carotid siphon region. Skull: The bony calvarium appears intact. Sinuses/Orbits: There is mucosal thickening in several ethmoid air cells. Other paranasal sinuses clear. Orbits appear symmetric bilaterally. Other: Mastoid air  cells are clear. IMPRESSION: Mild diffuse atrophy. Calcified meningioma anterior superior left frontal lobe measuring 1.9 x 1.3 cm without mass effect or edema. This finding is not felt to have clinical significance. 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 There is patchy periventricular small vessel disease. No acute infarct evident. No hemorrhage. Foci of arterial vascular calcification noted. Mucosal thickening noted in several ethmoid air cells. Electronically Signed   By: Lowella Grip III M.D.   On: 06/07/2019 08:59   DG CHEST PORT 1 VIEW  Result Date: 06/09/2019 CLINICAL DATA:  Hypoxia EXAM: PORTABLE CHEST 1 VIEW COMPARISON:  06/08/2019 FINDINGS: The patient has been extubated. The enteric tube has been removed. Again noted are low lung volumes with bibasilar airspace disease and probable small bilateral pleural effusions. There is no pneumothorax. The heart size is stable. Aortic calcifications are noted. IMPRESSION: 1. Status post extubation. 2. Persistent low lung volumes with bibasilar atelectasis and pleural effusions, similar to prior study. Electronically Signed   By: Constance Holster M.D.   On: 06/09/2019 02:32   DG Chest Port 1 View  Result Date: 06/08/2019 CLINICAL DATA:  Respiratory failure. EXAM: PORTABLE CHEST 1 VIEW COMPARISON:  06/07/2019 FINDINGS: The endotracheal tube is 2.7 cm above the carina. The NG tube is stable. Stable cardiac enlargement, pleural effusions and bilateral lower lobe infiltrates and atelectasis. IMPRESSION: 1. Stable support apparatus. 2. Persistent effusions and bibasilar infiltrates/atelectasis. Electronically Signed   By: Marijo Sanes M.D.   On: 06/08/2019 07:23   DG Chest Portable 1 View  Result Date: 06/07/2019 CLINICAL DATA:  Intubation and orogastric tube placement. EXAM: PORTABLE CHEST 1 VIEW COMPARISON:  Radiograph earlier this day. FINDINGS: Endotracheal tube tip is 2.3 cm from the carina.  Enteric tube tip is below the diaphragm in the stomach, side-port in the region of the gastroesophageal junction. Persistent low lung volumes. Overlying monitoring devices limit assessment of the right lung. Retrocardiac left basilar opacity better appreciated on the current exam. Possible small left pleural effusion. Heart appears normal in size, aortic atherosclerosis. Aortic tortuosity. Chronic atelectasis at the right lung base. No evidence of pneumothorax. Bones are under mineralized. There is scoliotic curvature of the spine. IMPRESSION: 1. Endotracheal tube tip 2.3 cm from the carina. Enteric tube tip below the diaphragm in the stomach, side-port in the region of the gastroesophageal junction. Consider advancement of approximately 4 cm for optimal placement. 2. Persistent low lung volumes. Left basilar opacity which was not well demonstrated on prior exam, may represent atelectasis or pneumonia. Possible small left pleural effusion. 3.  Aortic Atherosclerosis (ICD10-I70.0). Electronically Signed   By: Keith Rake M.D.   On: 06/07/2019 13:59   DG Shoulder Left  Result Date: 06/07/2019 CLINICAL DATA:  Left shoulder pain following falls, initial encounter EXAM: LEFT SHOULDER - 2 VIEW COMPARISON:  None. FINDINGS: Degenerative changes of the acromioclavicular joint are seen. The humeral head is somewhat high-riding which may be related to an underlying rotator  cuff injury. No dislocation or acute fracture is seen. Bony skeleton of the thorax appears within normal limits. IMPRESSION: Degenerative changes without acute abnormality. Electronically Signed   By: Inez Catalina M.D.   On: 06/07/2019 09:25   {  Assessment and Plan:   Cardiac arrest - unclear etiology, patient was intubated 4/6 in the ED and extubated 06/08/19 - Possible PEA arrest - CT head showed small vessel disease, no acute infarct or hemorrhage - tele shows Afib RVR, however this is chronic and known for the patient - Labs show  elevated D-dimer. Will get stat CT chest/abdomen/pelvis. creatinine stable - mildly elevated LFTS>>resolved - HS troponin 9. EKG shows Afib RVR and is nonischemic. Patient has low risk Myoview study in 2019. Patient does report intermittent exertional chest pain. If CT chest is negative for PE patient will likely need cath.  - BNP 1,296. Ordered repeat echo - Patient found to be septic started on IV abx unclear source  Afib RVR/persistent Afib s/p cardioversion x 3 - Started on IV dilt - Amiodarone drip. Order for bolus - K+ goal >4, Mag goal >2 - check TSH - continue Eliquis  Sepsis - no clear source - COVID negative. Respiratory panel negative - Blood culture with no growth - Lipase wnl - Lactic acid 6.9 on admission, improving - WBC 13.0, up from yesterday - UA negative - procal wnl - CXR yesterday shows persistent effusions with bibasilar infiltrates/atelectasis - Ordered CT chest/abd/pelvis - abx per IM  Acute pulmonary edema - CXR today unchanged from the day before - Lasix   For questions or updates, please contact [facility] Please consult [email or web address] for contact info under     Signed, Cliford Sequeira Ninfa Meeker, PA-C  06/09/2019 1:27 PM

## 2019-06-09 NOTE — Evaluation (Signed)
Physical Therapy Evaluation Patient Details Name: Joann Ford MRN: [id number] DOB: Oct 16, 1945 Today's Date: 06/09/2019   History of Present Illness  74 y/o female with PMHx significant for COPD, scoliosis, HLD, HTN and admitted on 06/07/19 after being brought into the [facility] and developing a cardiac arrest.  Previously in the day she presented to the [place] ED with non-specific complaints including malaise, falls, hallucintations.  Was noted to be hypoxemic, they recommended admission but she left AMA. She was brought to the [facility] ED by EMS today and on arrival was noted to be pulsess. Underwent standard resuscitation via ACLS protocol for < 5 minutes, was intubated, regained a pulse.  No clear cardiac arrhythmia noted.  ETT 4/6-06/08/19  Clinical Impression  Pt admitted with above diagnosis.  Pt currently with functional limitations due to the deficits listed below (see PT Problem List). Pt will benefit from skilled PT to increase their independence and safety with mobility to allow discharge to the venue listed below.  Pt with significant variations of HR during session and up to 180 bpm for transfer (RN aware).  Pt assisted to recliner (eager to get out of bed) for repositioning and comfort (has been uncomfortable in bed due to scoliosis per RN).  Pt with admission for fall and sustained rib fxs Nov/Dec 2020 and appears to have recently started requiring supplemental oxygen at home (March) per chart review.  Recommend SNF upon d/c.     Follow Up Recommendations SNF;Supervision/Assistance - 24 hour    Equipment Recommendations  None recommended by PT    Recommendations for Other Services       Precautions / Restrictions Precautions Precautions: Fall Precaution Comments: monitor vitals; severe scoliosis      Mobility  Bed Mobility Overal bed mobility: Needs Assistance Bed Mobility: Supine to Sit     Supine to sit: Min assist;HOB elevated     General bed mobility comments:  pt assisting with UEs, slight assist for scooting to EOB, utilized bed pad  Transfers Overall transfer level: Needs assistance Equipment used: Rolling walker (2 wheeled) Transfers: Sit to/from [facility] Sit to Stand: Mod assist;+2 physical assistance Stand pivot transfers: Mod assist;Min assist;+2 physical assistance       General transfer comment: multimodal cues for technique, assist due to weakness, HR up to 180 bpm during transfer (RN notified)  Ambulation/Gait             General Gait Details: deferred due to tachycardia  Stairs            Wheelchair Mobility    Modified Rankin (Stroke Patients Only)       Balance Overall balance assessment: Needs assistance         Standing balance support: Bilateral upper extremity supported Standing balance-Leahy Scale: Poor Standing balance comment: reliant on UE support                             Pertinent Vitals/Pain      Home Living Family/patient expects to be discharged to:: Private residence Living Arrangements: Spouse/significant other Available Help at Discharge: Family;Available 24 hours/day Type of Home: House Home Access: Stairs to enter Entrance Stairs-Rails: Left Entrance Stairs-Number of Steps: 1 Home Layout: One level Home Equipment: Walker - 2 wheels;Walker - 4 wheels;Bedside commode      Prior Function Level of Independence: Independent with assistive device(s)         Comments: Retired Theme park manager; uses walker  Hand Dominance        Extremity/Trunk Assessment        Lower Extremity Assessment Lower Extremity Assessment: Generalized weakness    Cervical / Trunk Assessment Cervical / Trunk Assessment: Kyphotic;Other exceptions Cervical / Trunk Exceptions: severe scoliosis  Communication   Communication: No difficulties  Cognition Arousal/Alertness: Awake/alert   Overall Cognitive Status: No family/caregiver present to determine  baseline cognitive functioning                                 General Comments: pt with some memory difficulty, requiring increased time (?processing and/or physically)      General Comments General comments (skin integrity, edema, etc.): Severe scoliosis, HR 120 bpm at rest then up to 180 bpm, 142 bpm after transfer  with RN aware; pt maintained on 3L O2 [place] and SPO2 89-91% during session    Exercises     Assessment/Plan    PT Assessment Patient needs continued PT services  PT Problem List Decreased balance;Decreased knowledge of use of DME;Decreased activity tolerance;Decreased strength;Decreased mobility;Cardiopulmonary status limiting activity       PT Treatment Interventions DME instruction;Therapeutic activities;Gait training;Therapeutic exercise;Patient/family education;Functional mobility training;Balance training    PT Goals (Current goals can be found in the Care Plan section)  Acute Rehab PT Goals PT Goal Formulation: With patient Time For Goal Achievement: 06/23/19 Potential to Achieve Goals: Good    Frequency Min 2X/week   Barriers to discharge        Co-evaluation PT/OT/SLP Co-Evaluation/Treatment: Yes Reason for Co-Treatment: For patient/therapist safety;To address functional/ADL transfers PT goals addressed during session: Mobility/safety with mobility OT goals addressed during session: ADL's and self-care       AM-PAC PT "6 Clicks" Mobility  Outcome Measure Help needed turning from your back to your side while in a flat bed without using bedrails?: A Little Help needed moving from lying on your back to sitting on the side of a flat bed without using bedrails?: A Little Help needed moving to and from a bed to a chair (including a wheelchair)?: A Lot Help needed standing up from a chair using your arms (e.g., wheelchair or bedside chair)?: A Lot Help needed to walk in hospital room?: A Lot Help needed climbing 3-5 steps with a railing? :  Total 6 Click Score: 13    End of Session Equipment Utilized During Treatment: Oxygen Activity Tolerance: Treatment limited secondary to medical complications (Comment)(tachycardia) Patient left: in chair;with chair alarm set;with call bell/phone within reach Nurse Communication: Mobility status PT Visit Diagnosis: Other abnormalities of gait and mobility (R26.89);Muscle weakness (generalized) (M62.81)    Time: 1140-1201 PT Time Calculation (min) (ACUTE ONLY): 21 min   Charges:   PT Evaluation $PT Eval Moderate Complexity: 1 Mod     Kati PT, DPT Acute Rehabilitation Services Office: [phone]    Jeremiah Tarpley,KATHrine E 06/09/2019, 1:00 PM

## 2019-06-09 NOTE — Progress Notes (Signed)
Nutrition Follow-up  RD working remotely.   DOCUMENTATION CODES:   Not applicable  INTERVENTION:  - diet advancement as medically feasible.  - if patient is taken off of BiPAP and continues to remain NPO and if within [place], recommend small bore NGT placement.    NUTRITION DIAGNOSIS:   Inadequate oral intake related to inability to eat as evidenced by NPO status. 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 Patient brought to [facility] ED via EMS and on arrival was noted to be pulseless, underwent standard resuscitation via ACLS protocol for <5 minutes, was intubated and regained a pulse.  Patient was extubated and OGT removed yesterday at ~1030. Patient remains on BiPAP and NPO since extubation. Able to briefly discuss with RN yesterday following extubation. Re-estimated nutrition needs at this time. Flow sheet documentation indicates that patient is a/o to self and place this morning.    Labs reviewed; CBG: 119 mg/dl, K: 3.3 mmol/l, Ca: 8.3 mg/dl, LFTs slightly elevated. Medications reviewed; 40 mg IV lasix x2 doses 4/7, 2 g IV Mg sulfate x1 run 4/7. IVF; D10 @ 50 ml/hr (408 kcal).    Diet Order:   Diet Order            Diet NPO time specified  Diet effective now              EDUCATION NEEDS:   No education needs have been identified at this time  Skin:  Skin Assessment: Skin Integrity Issues: Skin Integrity Issues:: Other (Comment) Other: MASD; abdomen  Last BM:  4/8  Height:    Ht Readings from Last 1 Encounters:  06/08/19 5\' 7"  (1.702 m)    Weight:   Wt Readings from Last 1 Encounters:  06/08/19 67 kg    Ideal Body Weight:  61.4 kg  BMI:  Body mass index is 23.13 kg/m.  Estimated Nutritional Needs:   Kcal:  [id number] kcal  Protein:  80-95 grams  Fluid:  >/= 1.5 L/day     Jarome Matin, MS, RD, LDN, CNSC Inpatient Clinical Dietitian RD pager # available in [facility]  After hours/weekend pager # available in [facility]

## 2019-06-09 NOTE — Progress Notes (Signed)
NAME:  Joann Ford, MRN:  [id number], DOB:  1946/02/10, LOS: 2 ADMISSION DATE:  06/07/2019, CONSULTATION DATE:  4/6 REFERRING MD: Karle Starch, CHIEF COMPLAINT:  Dyspnea   Brief History   74 y/o female admitted from [facility] On 4/6 in the setting of a cardiac arrest.  History of present illness   74 y/o female with COPD, scoliosis admitted on 4/6 after being brought into the [facility] and developing a cardaic arrest.  Previously in the day she presented to the [place] ED with non-specific complaints including malaise, falls, hallucintations.  Was noted to be hypoxemic, they recommended admission but she left AMA. She was brought to the [facility] ED by EMS today and on arrival was noted to be pulsess. Underwent standard resuscitation via ACLS protocol for < 5 minutes, was intubated, regained a pulse.  No clear cardiac arrhythmia noted.  Moving around, reaching for tube after arrest.  Past Medical History  COPD, followed by Dr. Lamonte Sakai Chronic respiratory failure with hypoxemia, inconsistent use of O2 Restrictive lung disease due to scoliosis Hyperlipidemia Atrial fibrillation Arthritis Anxiety  Significant Hospital Events   4/6 admission  Consults:    Procedures:  4/6 ETT > 4/7  Significant Diagnostic Tests:  4/6 CT head > mild diffuse atrophy, calcified meningioma anterior superior left frontal lobe 1.9 x 1.3 cm without mass effect or edema.  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  Patchy periventricular small vessel disease.  Micro Data:  4/6 blood >  4/6 SARS COV 2 > negative  Antimicrobials:   4/6 vanc x1 4/6 aztreonam > 4/7 4/6 azithromycin > 4/7 4/7 doxy >   Interim history/subjective:   Extubated yesterday Atrial fibrillation with RVR overnight  Objective   Blood pressure 131/69, pulse (!) 128, temperature 99.9 F (37.7 C), resp. rate (!) 26, height 5\' 7"  (1.702 m), weight 67 kg, SpO2 91 %.    Vent Mode: BIPAP FiO2 (%):  [40 %-60 %] 40 % Set Rate:   [10 bmp] 10 bmp PEEP:  [5 cmH20-6 cmH20] 6 cmH20 Pressure Support:  [8 cmH20] 8 cmH20 Plateau Pressure:  [14 cmH20] 14 cmH20   Intake/Output Summary (Last 24 hours) at 06/09/2019 [id number] Last data filed at 06/09/2019 0630 Gross per 24 hour  Intake 2306.85 ml  Output 4875 ml  Net -2568.15 ml   Filed Weights   06/08/19 0121  Weight: 67 kg    Examination:  General:  Some increased work of breathing, but otherwise stable HENT: NCAT OP clear PULM: CTA B, normal effort CV: tachy, no mgr GI: BS+, soft, nontender MSK: normal bulk and tone Neuro: awake, alert, no distress, MAEW    4/7 CXR images personally reviewed> ETT in place, bilateral air space disease left bases, RUL  Resolved Hospital Problem list     Assessment & Plan:  Cardiac arrest> etiology still uncertain, telemetry only shows atrial fibrillation Atrial fib with RVR Tele Monitor hemodynamics dilt drip Amiodarone drip Cardiology consult: worsening atrial fib> contributing to cardiac arrest? eliquis  Sepsis> no clear source or clear prodrome prior to admission; pneumonia? Tracheobronchitis, respiratory status improving Doxycycline 5 days today   Acute pulmonary edema Lasix again today  Acute on chronic respiratory failure with hypoxemia, unclear cause, not wheezing on exam COPD, not in exacerbation Chronic respiratory failure with hypoxemia, non-compliant with medications BIPAP prn Brovana/pulmicort/duoneb Administer O2 to keep O2 saturation > 88%  Chronic pain Prn morphine  Out of bed, PT consult   Best practice:  Diet:  start tube feeding Pain/Anxiety/Delirium protocol (if indicated): as above VAP protocol (if indicated): as above DVT prophylaxis: eliquis GI prophylaxis: famotidine Glucose control: monitor  Mobility: bed rest Code Status: DNR Family Communication: husband updated 4/8, will update again today Disposition: admit to ICU  Labs   CBC: Recent Labs  Lab 06/07/19 0820 06/07/19 1317  06/08/19 0622 06/09/19 0246  WBC 8.5 12.7* 8.1 13.0*  NEUTROABS 6.5  --   --  11.1*  HGB 14.3 14.2 12.6 13.4  HCT 44.5 46.5* 38.9 40.8  MCV 96.7 103.1* 97.0 94.7  PLT 211 202 141* 147*    Basic Metabolic Panel: Recent Labs  Lab 06/07/19 0820 06/07/19 1317 06/07/19 1920 06/08/19 0622 06/08/19 2138 06/09/19 0246  NA 140 139  --  138 141 143  K 4.2 4.2  --  4.0 2.9* 3.3*  CL 102 103  --  107 105 105  CO2 26 26  --  24 28 28   GLUCOSE 110* 144*  --  234* 109* 125*  BUN 14 16  --  19 17 15   CREATININE 0.77 0.90  --  0.68 0.64 0.54  CALCIUM 9.4 8.5*  --  8.2* 8.0* 8.3*  MG  --  2.3 1.9 1.8 1.7  --   PHOS  --   --  3.6 3.0  --   --    GFR: Estimated Creatinine Clearance: 60.9 mL/min (by C-G formula based on SCr of 0.54 mg/dL). Recent Labs  Lab 06/07/19 0820 06/07/19 1317 06/07/19 1521 06/07/19 1919 06/07/19 1920 06/07/19 2114 06/08/19 0622 06/09/19 0246  PROCALCITON  --   --   --   --  0.14  --   --   --   WBC 8.5 12.7*  --   --   --   --  8.1 13.0*  LATICACIDVEN  --  6.9* 1.7 2.5*  --  2.1*  --   --     Liver Function Tests: Recent Labs  Lab 06/07/19 0820 06/09/19 0246  AST 16 47*  ALT 13 46*  ALKPHOS 75 61  BILITOT 0.8 1.0  PROT 7.1 6.2*  ALBUMIN 3.7 2.9*   Recent Labs  Lab 06/07/19 0820 06/07/19 1920  LIPASE 19 17  AMYLASE  --  16*   No results for input(s): AMMONIA in the last 168 hours.  ABG    Component Value Date/Time   PHART 7.445 06/08/2019 0417   PCO2ART 36.0 06/08/2019 0417   PO2ART 106 06/08/2019 0417   HCO3 24.3 06/08/2019 0417   ACIDBASEDEF 0.6 06/07/2019 1228   O2SAT 98.7 06/08/2019 0417     Coagulation Profile: No results for input(s): INR, PROTIME in the last 168 hours.  Cardiac Enzymes: No results for input(s): CKTOTAL, CKMB, CKMBINDEX, TROPONINI in the last 168 hours.  HbA1C: No results found for: HGBA1C  CBG: Recent Labs  Lab 06/07/19 2320 06/08/19 0323 06/08/19 0737 06/08/19 1122 06/08/19 1613  GLUCAP 144*  159* 208* 144* 86    Review of Systems:   Cannot obtain due to intubation  Past Medical History  She,  has a past medical history of Anxiety, Arthritis, Atrial fibrillation ([place]), Chronic pain, Hyperlipidemia, Osteoporosis, and Scoliosis.   Surgical History    Past Surgical History:  Procedure Laterality Date  . CARDIOVERSION N/A 02/08/2018   Procedure: CARDIOVERSION;  Surgeon: Pixie Casino, MD;  Location: [facility] ENDOSCOPY;  Service: Cardiovascular;  Laterality: N/A;  . I and D of boil  1998, 1999   bilateral axilla  . TEE  WITHOUT CARDIOVERSION N/A 02/08/2018   Procedure: TRANSESOPHAGEAL ECHOCARDIOGRAM (TEE);  Surgeon: Pixie Casino, MD;  Location: [facility] ENDOSCOPY;  Service: Cardiovascular;  Laterality: N/A;     Social History   reports that she has been smoking cigarettes. She started smoking about 54 years ago. She has a 20.00 pack-year smoking history. She has never used smokeless tobacco. She reports that she does not drink alcohol or use drugs.   Family History   Her family history includes Cancer in her cousin and father; Pneumonia in her mother. There is no history of Colon cancer or Stomach cancer.   Allergies Allergies  Allergen Reactions  . Cefadroxil Anaphylaxis, Swelling and Other (See Comments)    Throat closes  . Ciprofloxacin Anaphylaxis and Other (See Comments)    Throat closes  . Penicillins Anaphylaxis and Swelling    Did it involve swelling of the face/tongue/throat, SOB, or low BP? Yes Did it involve sudden or severe rash/hives, skin peeling, or any reaction on the inside of your mouth or nose? No Did you need to seek medical attention at a hospital or doctor's office? Yes When did it last happen?"10-15 years ago" If all above answers are "NO", may proceed with cephalosporin use.   . Atorvastatin Other (See Comments)    Possible SAMS - 40 mg dose     Home Medications  Prior to Admission medications   Medication Sig Start Date End Date Taking?  Authorizing Provider  albuterol (PROVENTIL) (2.5 MG/3ML) 0.083% nebulizer solution Take 3 mLs (2.5 mg total) by nebulization every 6 (six) hours as needed for wheezing or shortness of breath. 05/18/17   Collene Gobble, MD  albuterol (VENTOLIN HFA) 108 (90 Base) MCG/ACT inhaler Inhale 2 puffs into the lungs every 6 (six) hours as needed for wheezing or shortness of breath.    [provider]  apixaban (ELIQUIS) 5 MG TABS tablet Take 1 tablet (5 mg total) by mouth 2 (two) times daily. 01/07/19   Hilty, Nadean Corwin, MD  diphenhydramine-acetaminophen (TYLENOL PM) 25-500 MG TABS tablet Take 2 tablets by mouth at bedtime as needed (for sleep).     [provider]  escitalopram (LEXAPRO) 20 MG tablet Take 1 tablet (20 mg total) by mouth daily. 09/29/11   Roma Schanz R, DO  furosemide (LASIX) 40 MG tablet TAKE 1 TABLET EVERY DAY 03/08/19   Hilty, Nadean Corwin, MD  gabapentin (NEURONTIN) 300 MG capsule Take 300-600 mg by mouth See admin instructions. Take 300 mg by mouth in the morning and 600 mg in the evening    [provider]  morphine (MS CONTIN) 15 MG 12 hr tablet Take 15 mg by mouth every 12 (twelve) hours.  04/16/18   [provider]  Tiotropium Bromide-Olodaterol (STIOLTO RESPIMAT) 2.5-2.5 MCG/ACT AERS Inhale 2 puffs into the lungs daily. 03/15/19   Collene Gobble, MD     Critical care time: 31 minutes     Roselie Awkward, MD [place] Pager: [phone] Cell: [phone] If no response, call [phone]

## 2019-06-09 NOTE — Evaluation (Signed)
Clinical/Bedside Swallow Evaluation Patient Details  Name: Joann Ford MRN: [id number] Date of Birth: [birth year]  Today's Date: 06/09/2019 Time: SLP Start Time (ACUTE ONLY): 1419 SLP Stop Time (ACUTE ONLY): 1427 SLP Time Calculation (min) (ACUTE ONLY): 8 min  Past Medical History:  Past Medical History:  Diagnosis Date  . Anxiety   . Arthritis   . Atrial fibrillation ([place])   . Chronic pain   . Hyperlipidemia   . Osteoporosis   . Scoliosis    Past Surgical History:  Past Surgical History:  Procedure Laterality Date  . CARDIOVERSION N/A 02/08/2018   Procedure: CARDIOVERSION;  Surgeon: Pixie Casino, MD;  Location: [facility] ENDOSCOPY;  Service: Cardiovascular;  Laterality: N/A;  . I and D of boil  1998, 1999   bilateral axilla  . TEE WITHOUT CARDIOVERSION N/A 02/08/2018   Procedure: TRANSESOPHAGEAL ECHOCARDIOGRAM (TEE);  Surgeon: Pixie Casino, MD;  Location: [facility] ENDOSCOPY;  Service: Cardiovascular;  Laterality: N/A;   HPI:  74 y/o female with COPD, scoliosis admitted on 4/6 after being brought into the [facility] and developing a cardaic arrest.  Previously in the day she presented to the [place] ED with non-specific complaints including malaise, falls, hallucintations.  Was noted to be hypoxemic, they recommended admission but she left AMA. She was brought to the [facility] ED by EMS today and on arrival was noted to be pulsess. Underwent standard resuscitation via ACLS protocol for < 5 minutes, was intubated, regained a pulse. Intubated 4/6-4/7.    Assessment / Plan / Recommendation Clinical Impression  Pt demonstrates coughing increasing in frequency and PO trials progressed. Congested cough observed after all bites and sips. Vocal quality clear and intubation quite brief. Pt would not respond fully to questions about history of trouble swallowing. Recommend instrumental swallow assessment tomorrow. Pt to remain NPO except for meds and small sips until that time.  SLP Visit  Diagnosis: Dysphagia, unspecified (R13.10)    Aspiration Risk  Moderate aspiration risk    Diet Recommendation NPO except meds   Medication Administration: Whole meds with puree Supervision: Staff to assist with self feeding Compensations: Slow rate;Small sips/bites Postural Changes: Seated upright at 90 degrees    Other  Recommendations     Follow up Recommendations        Frequency and Duration            Prognosis        Swallow Study   General HPI: 74 y/o female with COPD, scoliosis admitted on 4/6 after being brought into the [facility] and developing a cardaic arrest.  Previously in the day she presented to the [place] ED with non-specific complaints including malaise, falls, hallucintations.  Was noted to be hypoxemic, they recommended admission but she left AMA. She was brought to the [facility] ED by EMS today and on arrival was noted to be pulsess. Underwent standard resuscitation via ACLS protocol for < 5 minutes, was intubated, regained a pulse. Intubated 4/6-4/7.  Type of Study: Bedside Swallow Evaluation Diet Prior to this Study: NPO Temperature Spikes Noted: No Respiratory Status: Nasal cannula History of Recent Intubation: Yes Length of Intubations (days): 2 days Date extubated: 06/08/19 Behavior/Cognition: Alert;Cooperative;Pleasant mood Oral Cavity Assessment: Within Functional Limits Oral Care Completed by SLP: No Oral Cavity - Dentition: Poor condition Vision: Functional for self-feeding Self-Feeding Abilities: Able to feed self Patient Positioning: Upright in chair Baseline Vocal Quality: Normal Volitional Cough: Weak;Congested Volitional Swallow: Able to elicit    Oral/Motor/Sensory Function Overall  Oral Motor/Sensory Function: Within functional limits   Ice Chips     Thin Liquid Thin Liquid: Impaired Pharyngeal  Phase Impairments: Cough - Immediate;Cough - Delayed    Nectar Thick Nectar Thick Liquid: Not tested   Honey Thick Honey Thick  Liquid: Not tested   Puree Puree: Impaired Presentation: Spoon Pharyngeal Phase Impairments: Cough - Immediate   Solid     Solid: Impaired Oral Phase Impairments: Impaired mastication Pharyngeal Phase Impairments: Cough - Immediate     Joann Baltimore, MA CCC-SLP  Acute Rehabilitation Services Pager [phone] Office [phone]  [name] 06/09/2019,2:28 PM

## 2019-06-09 NOTE — Progress Notes (Signed)
Notified MD in regards to pt having multiple instances of 5 beat runs of v-tach. Stat BMP and Mag ordered

## 2019-06-09 NOTE — Progress Notes (Signed)
eLink Physician-Brief Progress Note Patient Name: [name] DOB: 21-Nov-1945 MRN: [id number]   Date of Service  06/09/2019  HPI/Events of Note  Remains in AFIB with RVR. HR = 140's to 150's. Currently on Cardizem IV infusion at 15 mg/min. K+ and Mg++ being replaced.   eICU Interventions  Will order: 1. Amiodarone IV load and infusion.      Intervention Category Major Interventions: Arrhythmia - evaluation and management  Joann Ford Cornelia Copa 06/09/2019, 12:34 AM

## 2019-06-10 ENCOUNTER — Inpatient Hospital Stay (HOSPITAL_COMMUNITY): Payer: Medicare HMO

## 2019-06-10 DIAGNOSIS — I340 Nonrheumatic mitral (valve) insufficiency: Secondary | ICD-10-CM

## 2019-06-10 DIAGNOSIS — I350 Nonrheumatic aortic (valve) stenosis: Secondary | ICD-10-CM

## 2019-06-10 DIAGNOSIS — I351 Nonrheumatic aortic (valve) insufficiency: Secondary | ICD-10-CM

## 2019-06-10 DIAGNOSIS — J9601 Acute respiratory failure with hypoxia: Secondary | ICD-10-CM

## 2019-06-10 LAB — COMPREHENSIVE METABOLIC PANEL
ALT: 38 U/L (ref 0–44)
AST: 31 U/L (ref 15–41)
Albumin: 3.1 g/dL — ABNORMAL LOW (ref 3.5–5.0)
Alkaline Phosphatase: 63 U/L (ref 38–126)
Anion gap: 10 (ref 5–15)
BUN: 8 mg/dL (ref 8–23)
CO2: 30 mmol/L (ref 22–32)
Calcium: 8.7 mg/dL — ABNORMAL LOW (ref 8.9–10.3)
Chloride: 98 mmol/L (ref 98–111)
Creatinine, Ser: 0.52 mg/dL (ref 0.44–1.00)
GFR calc Af Amer: >60 mL/min (ref >60)
GFR calc non Af Amer: >60 mL/min (ref >60)
Glucose, Bld: 129 mg/dL — ABNORMAL HIGH (ref 70–99)
Potassium: 3.3 mmol/L — ABNORMAL LOW (ref 3.5–5.1)
Sodium: 138 mmol/L (ref 135–145)
Total Bilirubin: 1.3 mg/dL — ABNORMAL HIGH (ref 0.3–1.2)
Total Protein: 6.5 g/dL (ref 6.5–8.1)

## 2019-06-10 LAB — GLUCOSE, CAPILLARY
Glucose-Capillary: 124 mg/dL — ABNORMAL HIGH (ref 70–99)
Glucose-Capillary: 143 mg/dL — ABNORMAL HIGH (ref 70–99)
Glucose-Capillary: 139 mg/dL — ABNORMAL HIGH (ref 70–99)
Glucose-Capillary: 127 mg/dL — ABNORMAL HIGH (ref 70–99)

## 2019-06-10 LAB — CBC WITH DIFFERENTIAL/PLATELET
Abs Immature Granulocytes: 0.05 10*3/uL (ref 0.00–0.07)
Basophils Absolute: 0 10*3/uL (ref 0.0–0.1)
Basophils Relative: 0 %
Eosinophils Absolute: 0 10*3/uL (ref 0.0–0.5)
Eosinophils Relative: 0 %
HCT: 40.8 % (ref 36.0–46.0)
Hemoglobin: 13.2 g/dL (ref 12.0–15.0)
Immature Granulocytes: 0 %
Lymphocytes Relative: 6 %
Lymphs Abs: 0.7 10*3/uL (ref 0.7–4.0)
MCH: 30.6 pg (ref 26.0–34.0)
MCHC: 32.4 g/dL (ref 30.0–36.0)
MCV: 94.4 fL (ref 80.0–100.0)
Monocytes Absolute: 1.2 10*3/uL — ABNORMAL HIGH (ref 0.1–1.0)
Monocytes Relative: 9 %
Neutro Abs: 10.8 10*3/uL — ABNORMAL HIGH (ref 1.7–7.7)
Neutrophils Relative %: 85 %
Platelets: 141 10*3/uL — ABNORMAL LOW (ref 150–400)
RBC: 4.32 MIL/uL (ref 3.87–5.11)
RDW: 14 % (ref 11.5–15.5)
WBC: 12.8 10*3/uL — ABNORMAL HIGH (ref 4.0–10.5)
nRBC: 0 % (ref 0.0–0.2)

## 2019-06-10 LAB — ECHOCARDIOGRAM COMPLETE
Height: 67 in
Weight: 2363.33 oz

## 2019-06-10 MED ORDER — POTASSIUM CHLORIDE 10 MEQ/50ML IV SOLN
10.0000 meq | INTRAVENOUS | Status: AC
  Administered 2019-06-10 (×4): 10 meq via INTRAVENOUS
  Filled 2019-06-10 (×3): qty 50

## 2019-06-10 MED ORDER — AMIODARONE IV BOLUS ONLY 150 MG/100ML
150.0000 mg | Freq: Once | INTRAVENOUS | Status: AC
  Administered 2019-06-10: 150 mg via INTRAVENOUS

## 2019-06-10 MED ORDER — METOPROLOL TARTRATE 5 MG/5ML IV SOLN
5.0000 mg | Freq: Four times a day (QID) | INTRAVENOUS | Status: DC
  Filled 2019-06-10: qty 5

## 2019-06-10 MED ORDER — POTASSIUM CHLORIDE CRYS ER 20 MEQ PO TBCR
40.0000 meq | EXTENDED_RELEASE_TABLET | Freq: Once | ORAL | Status: AC
  Administered 2019-06-10: 40 meq via ORAL
  Filled 2019-06-10: qty 2

## 2019-06-10 MED ORDER — METOPROLOL TARTRATE 25 MG PO TABS: 50.0000 mg | ORAL_TABLET | Freq: Two times a day (BID) | ORAL | Status: DC

## 2019-06-10 MED ORDER — RESOURCE THICKENUP CLEAR PO POWD
ORAL | Status: DC | PRN
  Filled 2019-06-10: qty 125

## 2019-06-10 MED ORDER — FUROSEMIDE 10 MG/ML IJ SOLN
40.0000 mg | Freq: Four times a day (QID) | INTRAMUSCULAR | Status: AC
  Administered 2019-06-10 (×2): 40 mg via INTRAVENOUS
  Filled 2019-06-10 (×2): qty 4

## 2019-06-10 MED ORDER — METOPROLOL TARTRATE 5 MG/5ML IV SOLN
10.0000 mg | Freq: Four times a day (QID) | INTRAVENOUS | Status: DC
  Administered 2019-06-10 – 2019-06-16 (×26): 10 mg via INTRAVENOUS
  Filled 2019-06-10 (×26): qty 10

## 2019-06-10 NOTE — Progress Notes (Addendum)
NAME:  Joann Ford, MRN:  [id number], DOB:  [birth year], LOS: 3 ADMISSION DATE:  06/07/2019, CONSULTATION DATE:  4/6 REFERRING MD: Karle Starch, CHIEF COMPLAINT:  Dyspnea   Brief History   74 y/o female admitted from [facility] On 4/6 in the setting of a cardiac arrest.  Previously in the day 4/6 she presented to the [place] ED with non-specific complaints including malaise, falls, hallucintations.  Was noted to be hypoxemic, they recommended admission but she left AMA. She was brought to the [facility] ED by EMS hours after and on arrival was noted to be pulsess. Underwent standard resuscitation via ACLS protocol for < 5 minutes, was intubated, regained a pulse.  No clear cardiac arrhythmia noted.  Moving around, reaching for tube after arrest. Extubated 4/7.   Past Medical History  COPD, followed by Dr. Lamonte Sakai Chronic respiratory failure with hypoxemia, inconsistent use of O2 Restrictive lung disease due to scoliosis Hyperlipidemia Atrial fibrillation [facility] Events   4/06 Admission  Consults:    Procedures:  ETT 4/6 > 4/7  Significant Diagnostic Tests:   CT head 4/6 > mild diffuse atrophy, calcified meningioma anterior superior left frontal lobe 1.9 x 1.3 cm without mass effect or edema.  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  Patchy periventricular small vessel disease.  CT ABD/Pelvis 4/8 >> 7.4 x2 cm fluid collection in SQ tissues of thel lateral hip adjacent to the greater trochanter of the left femur  CTA Chest 4/8 >> limited study due to respiratory motion, no large central PE, bibasilar patchy consolidative changes with air bronchograms, scattered clusters of ground-glass subpleural density primarily involving the LUL, small bilateral pleural effusions, non-displaced sternal fracture, probable bilateral rib fractures  Micro Data:  COVID 4/6 >> negative  RVP 4/6 >> negative  BCx2 4/6 >>   Antimicrobials:  Vanco 4/6 x1    Aztreonam 4/6 >> 4/7  Azithro 4/6 >> 4/7  Doxy 4/7 >>   Interim history/subjective:  Remains in AF, rate 120's on amiodarone, cardizem gtt Tmax 100.2 Glucose 129 UOP 4.8L, net -1.7L in 24h On bipap for desaturation this am   Objective   Blood pressure 125/74, pulse (!) 119, temperature 99.9 F (37.7 C), resp. rate (!) 29, height 5\' 7"  (1.702 m), weight 67 kg, SpO2 94 %.    Vent Mode: BIPAP FiO2 (%):  [40 %] 40 % PEEP:  [6 cmH20] 6 cmH20   Intake/Output Summary (Last 24 hours) at 06/10/2019 1059 Last data filed at 06/10/2019 [id number] Gross per 24 hour  Intake 2710.67 ml  Output 4850 ml  Net -2139.33 ml   Filed Weights   06/08/19 0121  Weight: 67 kg    Examination: General: frail elderly female lying in bed in NAD  HEENT: MM pink/moist, bipap mask in place  Neuro: Awake, alert, follows commands, generalized weakness CV: s1s2 irr irr, 120's on monitor / AF, no m/r/g PULM:  Non-labored on BiPAP, crackles / wheezing bilateral posterior GI: soft, bsx4 active  Extremities: warm/dry, no edema  Skin: no rashes or lesions   Resolved Hospital Problem list     Assessment & Plan:   Cardiac Arrest AF on tele. Initial notes reflect brady arrest, raises suspicion for respiratory nature of decompensation  Suspect infectious cause contributed to arrest.  -supportive care -ICU monitoring   Atrial Fib with RVR -appreciate Cardiology consultation  -continue cardizem, amiodarone gtt's  -on Eliquis   Sepsis  Tracheobronchitis  CT chest supports PNA -continue  doxycycline, D4/x   Acute Pulmonary Edema Bilateral Pleural Effusions  -repeat lasix with KCL -follow up CXR in am   Acute on Chronic Respiratory Failure with Hypoxemia  COPD Noncompliant with home medications.  Wheezing noted on exam 4/9, ? Contribution to failure / arrest in the setting of non-compliance. CT chest with concern for PNA.  -continue BiPAP PRN  -continue brovana, pulmicort, duoneb -wean O2 for sats  88-95% -pulmonary hygiene - IS, mobilize with PT -follow intermittent CXR   Hypokalemia  In setting of diuresis  -monitor, replace as indicated   Hypoglycemia  -continue D10 infusion  -check CBG Q4  Chronic Pain -PRN morphine   At Risk Dysphagia  In setting of recent intubation, generalized weakness / severe kyphosis  -SLP evaluation    Best practice:  Diet: NPO x meds, SLP evaluation  Pain/Anxiety/Delirium protocol (if indicated): pain control as above  VAP protocol (if indicated): as above DVT prophylaxis: eliquis GI prophylaxis: famotidine Glucose control: monitor  Mobility: bed rest Code Status: DNR Family Communication: will update family on arrival. Disposition: ICU   Labs   CBC: Recent Labs  Lab 06/07/19 0820 06/07/19 1317 06/08/19 0622 06/09/19 0246 06/10/19 0304  WBC 8.5 12.7* 8.1 13.0* 12.8*  NEUTROABS 6.5  --   --  11.1* 10.8*  HGB 14.3 14.2 12.6 13.4 13.2  HCT 44.5 46.5* 38.9 40.8 40.8  MCV 96.7 103.1* 97.0 94.7 94.4  PLT 211 202 141* 147* 141*    Basic Metabolic Panel: Recent Labs  Lab 06/07/19 1317 06/07/19 1317 06/07/19 1920 06/08/19 0622 06/08/19 0622 06/08/19 2138 06/09/19 0246 06/09/19 0844 06/09/19 1336 06/10/19 0304  NA 139   < >  --  138   < > 141 143 140 139 138  K 4.2   < >  --  4.0   < > 2.9* 3.3* 3.1* 3.0* 3.3*  CL 103   < >  --  107   < > 105 105 105 100 98  CO2 26   < >  --  24   < > 28 28 28  32 30  GLUCOSE 144*   < >  --  234*   < > 109* 125* 137* 131* 129*  BUN 16   < >  --  19   < > 17 15 12 10 8   CREATININE 0.90   < >  --  0.68   < > 0.64 0.54 0.60 0.56 0.52  CALCIUM 8.5*   < >  --  8.2*   < > 8.0* 8.3* 8.8* 8.5* 8.7*  MG 2.3  --  1.9 1.8  --  1.7  --   --  2.0  --   PHOS  --   --  3.6 3.0  --   --   --   --   --   --    < > = values in this interval not displayed.   GFR: Estimated Creatinine Clearance: 60.9 mL/min (by C-G formula based on SCr of 0.52 mg/dL). Recent Labs  Lab 06/07/19 1317 06/07/19 1521  06/07/19 1919 06/07/19 1920 06/07/19 2114 06/08/19 0622 06/09/19 0246 06/10/19 0304  PROCALCITON  --   --   --  0.14  --   --   --   --   WBC 12.7*  --   --   --   --  8.1 13.0* 12.8*  LATICACIDVEN 6.9* 1.7 2.5*  --  2.1*  --   --   --  Liver Function Tests: Recent Labs  Lab 06/07/19 0820 06/09/19 0246 06/09/19 1336 06/10/19 0304  AST 16 47* 38 31  ALT 13 46* 44 38  ALKPHOS 75 61 68 63  BILITOT 0.8 1.0 0.9 1.3*  PROT 7.1 6.2* 6.6 6.5  ALBUMIN 3.7 2.9* 3.1* 3.1*   Recent Labs  Lab 06/07/19 0820 06/07/19 1920  LIPASE 19 17  AMYLASE  --  16*   No results for input(s): AMMONIA in the last 168 hours.  ABG    Component Value Date/Time   PHART 7.445 06/08/2019 0417   PCO2ART 36.0 06/08/2019 0417   PO2ART 106 06/08/2019 0417   HCO3 24.3 06/08/2019 0417   ACIDBASEDEF 0.6 06/07/2019 1228   O2SAT 98.7 06/08/2019 0417     Coagulation Profile: No results for input(s): INR, PROTIME in the last 168 hours.  Cardiac Enzymes: No results for input(s): CKTOTAL, CKMB, CKMBINDEX, TROPONINI in the last 168 hours.  HbA1C: No results found for: HGBA1C  CBG: Recent Labs  Lab 06/08/19 0323 06/08/19 0737 06/08/19 1122 06/08/19 1613 06/09/19 0728  GLUCAP 159* 208* 144* 86 119*     Critical care time: 30 minutes    Noe Gens, MSN, NP-C [facility] 06/10/2019, 11:23 AM   Please see [email or web address] for pager details.

## 2019-06-10 NOTE — Progress Notes (Signed)
  Echocardiogram 2D Echocardiogram has been performed.  Joann Ford 06/10/2019, 9:01 AM

## 2019-06-10 NOTE — Progress Notes (Addendum)
  Speech Language Pathology Treatment: Dysphagia  Patient Details Name: BYRDIE VOEGELE MRN: [id number] DOB: 02-10-1946 Today's Date: 06/10/2019 Time: 1330-1350 SLP Time Calculation (min) (ACUTE ONLY): 20 min  Assessment / Plan / Recommendation Clinical Impression  Pt seen at bedside for subjective assessment with modified textures given poor respiratory stamina preventing MBS today. Pt calm and upright in bed, severe kyphosis, but well supported. No significant coughing at baseline, vocal quality clear. SLP first offered bites of puree and teaspoons and sips of honey thick liquids. These trials only elicited a single soft bark like cough after each bite/sip. Appeared more habitual than reactive to aspiration. When thin liquids were given, pt had consistent, delayed, more explosive coughing concerning for aspiration.   Since instrumental testing is not available at this time, recommend pt initiate a conservative modified diet with close monitoring for tolerance from staff. Pt may have bites of puree/pudding (dys1) with honey thick liquids in small amounts. If hard prolonged coughing or respiratory decline/new signs of congestion occurs, stop intake. Will plan to f/u on Monday unless concerns arise over the weekend. SLP weekend pager is [phone]. On Monday pt may need a FEES (can bring from [facility]) if MBS is still too difficult.    HPI HPI: 74 y/o female with COPD, scoliosis admitted on 4/6 after being brought into the [facility] and developing a cardaic arrest.  Previously in the day she presented to the [place] ED with non-specific complaints including malaise, falls, hallucintations.  Was noted to be hypoxemic, they recommended admission but she left AMA. She was brought to the [facility] ED by EMS today and on arrival was noted to be pulsess. Underwent standard resuscitation via ACLS protocol for < 5 minutes, was intubated, regained a pulse. Intubated 4/6-4/7.       SLP Plan  Continue with  current plan of care;Other (Comment)(FEES if needed)       Recommendations  Diet recommendations: Dysphagia 1 (puree);Honey-thick liquid Liquids provided via: Cup;Teaspoon;Straw Medication Administration: Whole meds with puree Supervision: Full supervision/cueing for compensatory strategies Compensations: Slow rate;Small sips/bites Postural Changes and/or Swallow Maneuvers: Seated upright 90 degrees                Oral Care Recommendations: Oral care BID Follow up Recommendations: Skilled Nursing facility Plan: Continue with current plan of care;Other (Comment)(FEES if needed)       GO                Myles Mallicoat, Katherene Ponto 06/10/2019, 2:06 PM

## 2019-06-10 NOTE — Progress Notes (Signed)
Progress Note  Patient Name: Joann Ford Date of Encounter: 06/10/2019  Primary Cardiologist: Pixie Casino, MD   Subjective   Denies any chest pain. Continues to complain of SOB.  Remains in afib with RVR.  Inpatient Medications    Scheduled Meds: . apixaban  5 mg Oral BID  . arformoterol  15 mcg Nebulization BID  . budesonide (PULMICORT) nebulizer solution  0.5 mg Nebulization BID  . chlorhexidine  15 mL Mouth Rinse BID  . Chlorhexidine Gluconate Cloth  6 each Topical Daily  . mouth rinse  15 mL Mouth Rinse q12n4p  . metoprolol tartrate  10 mg Intravenous Q6H   Continuous Infusions: . amiodarone 30 mg/hr (06/10/19 1800)  . dextrose 50 mL/hr at 06/10/19 1800  . diltiazem (CARDIZEM) infusion 15 mg/hr (06/10/19 1800)  . doxycycline (VIBRAMYCIN) IV Stopped (06/10/19 1247)   PRN Meds: acetaminophen, haloperidol lactate, ipratropium-albuterol, levalbuterol, morphine injection, ondansetron (ZOFRAN) IV, Resource ThickenUp Clear, sodium chloride flush   Vital Signs    Vitals:   06/10/19 1600 06/10/19 1700 06/10/19 1758 06/10/19 1800  BP: 123/66 125/65  122/75  Pulse: 65 (!) 119 (!) 103 (!) 105  Resp: (!) 32 (!) 28 (!) 33 (!) 32  Temp: (!) 100.8 F (38.2 C) (!) 100.6 F (38.1 C) 100.2 F (37.9 C) 100.2 F (37.9 C)  TempSrc: Bladder     SpO2: 96% 95% 97% 96%  Weight:      Height:        Intake/Output Summary (Last 24 hours) at 06/10/2019 1856 Last data filed at 06/10/2019 1800 Gross per 24 hour  Intake 2511.65 ml  Output 2225 ml  Net 286.65 ml   Filed Weights   06/08/19 0121  Weight: 67 kg    Telemetry    Atrial fibrillation with RVR - Personally Reviewed  ECG    No new EKG to review - Personally Reviewed  Physical Exam   GEN: No acute distress.   Neck: No JVD Cardiac: irregularly irregular, no murmurs, rubs, or gallops.  Respiratory: Clear to auscultation bilaterally. GI: Soft, nontender, non-distended  MS: No edema; No deformity. Neuro:   Nonfocal  Psych: Normal affect   Labs    Chemistry Recent Labs  Lab 06/09/19 0246 06/09/19 0246 06/09/19 0844 06/09/19 1336 06/10/19 0304  NA 143   < > 140 139 138  K 3.3*   < > 3.1* 3.0* 3.3*  CL 105   < > 105 100 98  CO2 28   < > 28 32 30  GLUCOSE 125*   < > 137* 131* 129*  BUN 15   < > 12 10 8   CREATININE 0.54   < > 0.60 0.56 0.52  CALCIUM 8.3*   < > 8.8* 8.5* 8.7*  PROT 6.2*  --   --  6.6 6.5  ALBUMIN 2.9*  --   --  3.1* 3.1*  AST 47*  --   --  38 31  ALT 46*  --   --  44 38  ALKPHOS 61  --   --  68 63  BILITOT 1.0  --   --  0.9 1.3*  GFRNONAA >60   < > >60 >60 >60  GFRAA >60   < > >60 >60 >60  ANIONGAP 10   < > 7 7 10    < > = values in this interval not displayed.     Hematology Recent Labs  Lab 06/08/19 0622 06/09/19 0246 06/10/19 0304  WBC 8.1 13.0* 12.8*  RBC 4.01 4.31 4.32  HGB 12.6 13.4 13.2  HCT 38.9 40.8 40.8  MCV 97.0 94.7 94.4  MCH 31.4 31.1 30.6  MCHC 32.4 32.8 32.4  RDW 14.0 14.2 14.0  PLT 141* 147* 141*    Cardiac EnzymesNo results for input(s): TROPONINI in the last 168 hours. No results for input(s): TROPIPOC in the last 168 hours.   BNP Recent Labs  Lab 06/07/19 1317  BNP 1,296.5*     DDimer  Recent Labs  Lab 06/07/19 1920  DDIMER 3.74*     Radiology    CT ANGIO CHEST PE W OR WO CONTRAST  Result Date: 06/09/2019 CLINICAL DATA:  74 year old female with chest pain and hypoxia. History of COPD and cardiac arrest. Abdominal pain. EXAM: CT ANGIOGRAPHY CHEST CT ABDOMEN AND PELVIS WITH CONTRAST TECHNIQUE: Multidetector CT imaging of the chest was performed using the standard protocol during bolus administration of intravenous contrast. Multiplanar CT image reconstructions and MIPs were obtained to evaluate the vascular anatomy. Multidetector CT imaging of the abdomen and pelvis was performed using the standard protocol during bolus administration of intravenous contrast. CONTRAST:  5mL OMNIPAQUE IOHEXOL 350 MG/ML SOLN COMPARISON:   Chest CT dated 01/21/2019. FINDINGS: Evaluation of this exam is limited due to respiratory motion artifact. Evaluation is also limited due to streak artifact caused by patient's arms. CTA CHEST FINDINGS Cardiovascular: Mild cardiomegaly. No pericardial effusion. There is coronary vascular calcification. There is moderate atherosclerotic calcification of the thoracic aorta. No aneurysmal dilatation or dissection. Evaluation of the pulmonary arteries is very limited due to respiratory motion artifact and suboptimal opacification of the peripheral branches. No large central pulmonary artery embolus identified. Mediastinum/Nodes: No definite hilar or mediastinal adenopathy. Evaluation however is limited due to respiratory motion artifact and consolidative changes of the lungs. The esophagus is grossly unremarkable. No mediastinal fluid collection. Lungs/Pleura: There are bibasilar patchy consolidative changes with air bronchogram which may represent atelectasis or pneumonia. Clinical correlation is recommended. Scattered clusters of ground-glass subpleural density primarily involving the left upper lobe noted which is new since the prior CT and concerning for atypical pneumonia. There are small bilateral pleural effusions. No pneumothorax. The central airways are patent. Musculoskeletal: There is a nondisplaced fracture of the body of the sternum, new since the prior CT. Mild compression fracture of the inferior endplate of T2 also new since the prior CT. There is thoracolumbar scoliosis. Probable bilateral rib fractures. Evaluation however is very limited due to motion artifact. Review of the MIP images confirms the above findings. CT ABDOMEN and PELVIS FINDINGS No intra-abdominal free air or free fluid. Hepatobiliary: The liver is unremarkable. No intrahepatic biliary ductal dilatation. Probable gallstone. No pericholecystic fluid. Pancreas: Unremarkable. No pancreatic ductal dilatation or surrounding inflammatory  changes. Spleen: Normal in size without focal abnormality. Adrenals/Urinary Tract: The adrenal glands are unremarkable. There is no hydronephrosis on either side. There is symmetric enhancement and excretion of contrast by both kidneys. The visualized ureters appear unremarkable. The urinary bladder is partially distended around a Foley catheter. Stomach/Bowel: There is sigmoid diverticulosis without active inflammatory changes. There is no bowel obstruction. Vascular/Lymphatic: Advanced aortoiliac atherosclerotic disease. The IVC is unremarkable. No portal venous gas. There is no adenopathy. Reproductive: The uterus is small, likely atrophic. No adnexal masses. Other: Diffuse subcutaneous edema. There is a 7.4 x 2.0 cm fluid collection in the subcutaneous soft tissues of the left lateral hip adjacent to the greater trochanter of the left femur. Musculoskeletal: Osteopenia with scoliosis and degenerative changes of the spine. Bilateral  hip osteoarthritis. No acute osseous pathology. Review of the MIP images confirms the above findings. IMPRESSION: 1. Very limited study due to respiratory motion artifact. No large central pulmonary artery embolus identified. 2. Bibasilar patchy consolidative changes with air bronchogram which may represent atelectasis or pneumonia. Clinical correlation is recommended. 3. Scattered clusters of ground-glass subpleural density primarily involving the left upper lobe is new since the prior CT and concerning for atypical pneumonia. Clinical correlation is recommended. 4. Small bilateral pleural effusions. 5. Nondisplaced fracture of the body of the sternum, new since the prior CT. Probable bilateral rib fractures. 6. Mild compression fracture of the inferior endplate of T2, also new since the prior CT. 7. Colonic diverticulosis. No bowel obstruction. 8. A 7.4 x 2.0 cm fluid collection in the subcutaneous soft tissues of the left lateral hip adjacent to the greater trochanter of the left  femur. 9. Aortic Atherosclerosis (ICD10-I70.0). Electronically Signed   By: Anner Crete M.D.   On: 06/09/2019 17:17   CT ABDOMEN PELVIS W CONTRAST  Result Date: 06/09/2019 CLINICAL DATA:  74 year old female with chest pain and hypoxia. History of COPD and cardiac arrest. Abdominal pain. EXAM: CT ANGIOGRAPHY CHEST CT ABDOMEN AND PELVIS WITH CONTRAST TECHNIQUE: Multidetector CT imaging of the chest was performed using the standard protocol during bolus administration of intravenous contrast. Multiplanar CT image reconstructions and MIPs were obtained to evaluate the vascular anatomy. Multidetector CT imaging of the abdomen and pelvis was performed using the standard protocol during bolus administration of intravenous contrast. CONTRAST:  27mL OMNIPAQUE IOHEXOL 350 MG/ML SOLN COMPARISON:  Chest CT dated 01/21/2019. FINDINGS: Evaluation of this exam is limited due to respiratory motion artifact. Evaluation is also limited due to streak artifact caused by patient's arms. CTA CHEST FINDINGS Cardiovascular: Mild cardiomegaly. No pericardial effusion. There is coronary vascular calcification. There is moderate atherosclerotic calcification of the thoracic aorta. No aneurysmal dilatation or dissection. Evaluation of the pulmonary arteries is very limited due to respiratory motion artifact and suboptimal opacification of the peripheral branches. No large central pulmonary artery embolus identified. Mediastinum/Nodes: No definite hilar or mediastinal adenopathy. Evaluation however is limited due to respiratory motion artifact and consolidative changes of the lungs. The esophagus is grossly unremarkable. No mediastinal fluid collection. Lungs/Pleura: There are bibasilar patchy consolidative changes with air bronchogram which may represent atelectasis or pneumonia. Clinical correlation is recommended. Scattered clusters of ground-glass subpleural density primarily involving the left upper lobe noted which is new since  the prior CT and concerning for atypical pneumonia. There are small bilateral pleural effusions. No pneumothorax. The central airways are patent. Musculoskeletal: There is a nondisplaced fracture of the body of the sternum, new since the prior CT. Mild compression fracture of the inferior endplate of T2 also new since the prior CT. There is thoracolumbar scoliosis. Probable bilateral rib fractures. Evaluation however is very limited due to motion artifact. Review of the MIP images confirms the above findings. CT ABDOMEN and PELVIS FINDINGS No intra-abdominal free air or free fluid. Hepatobiliary: The liver is unremarkable. No intrahepatic biliary ductal dilatation. Probable gallstone. No pericholecystic fluid. Pancreas: Unremarkable. No pancreatic ductal dilatation or surrounding inflammatory changes. Spleen: Normal in size without focal abnormality. Adrenals/Urinary Tract: The adrenal glands are unremarkable. There is no hydronephrosis on either side. There is symmetric enhancement and excretion of contrast by both kidneys. The visualized ureters appear unremarkable. The urinary bladder is partially distended around a Foley catheter. Stomach/Bowel: There is sigmoid diverticulosis without active inflammatory changes. There is no bowel obstruction. Vascular/Lymphatic:  Advanced aortoiliac atherosclerotic disease. The IVC is unremarkable. No portal venous gas. There is no adenopathy. Reproductive: The uterus is small, likely atrophic. No adnexal masses. Other: Diffuse subcutaneous edema. There is a 7.4 x 2.0 cm fluid collection in the subcutaneous soft tissues of the left lateral hip adjacent to the greater trochanter of the left femur. Musculoskeletal: Osteopenia with scoliosis and degenerative changes of the spine. Bilateral hip osteoarthritis. No acute osseous pathology. Review of the MIP images confirms the above findings. IMPRESSION: 1. Very limited study due to respiratory motion artifact. No large central  pulmonary artery embolus identified. 2. Bibasilar patchy consolidative changes with air bronchogram which may represent atelectasis or pneumonia. Clinical correlation is recommended. 3. Scattered clusters of ground-glass subpleural density primarily involving the left upper lobe is new since the prior CT and concerning for atypical pneumonia. Clinical correlation is recommended. 4. Small bilateral pleural effusions. 5. Nondisplaced fracture of the body of the sternum, new since the prior CT. Probable bilateral rib fractures. 6. Mild compression fracture of the inferior endplate of T2, also new since the prior CT. 7. Colonic diverticulosis. No bowel obstruction. 8. A 7.4 x 2.0 cm fluid collection in the subcutaneous soft tissues of the left lateral hip adjacent to the greater trochanter of the left femur. 9. Aortic Atherosclerosis (ICD10-I70.0). Electronically Signed   By: Anner Crete M.D.   On: 06/09/2019 17:17   DG CHEST PORT 1 VIEW  Result Date: 06/09/2019 CLINICAL DATA:  Hypoxia EXAM: PORTABLE CHEST 1 VIEW COMPARISON:  06/08/2019 FINDINGS: The patient has been extubated. The enteric tube has been removed. Again noted are low lung volumes with bibasilar airspace disease and probable small bilateral pleural effusions. There is no pneumothorax. The heart size is stable. Aortic calcifications are noted. IMPRESSION: 1. Status post extubation. 2. Persistent low lung volumes with bibasilar atelectasis and pleural effusions, similar to prior study. Electronically Signed   By: Constance Holster M.D.   On: 06/09/2019 02:32   ECHOCARDIOGRAM COMPLETE  Result Date: 06/10/2019    ECHOCARDIOGRAM REPORT   Patient Name:   TAQUASIA SAWICKI Osmundson Date of Exam: 06/10/2019 Medical Rec #:  [id number]       Height:       67.0 in Accession #:    [id number]      Weight:       147.7 lb Date of Birth:  Oct 21, 1945       BSA:          1.778 m Patient Age:    74 years        BP:           126/80 mmHg Patient Gender: F               HR:            120 bpm. Exam Location:  Inpatient Procedure: 2D Echo, Cardiac Doppler and Color Doppler Indications:    Cardiac arrest  History:        Patient has prior history of Echocardiogram examinations, most                 recent 02/08/2018. Abnormal ECG, COPD, Aortic Valve Disease,                 Arrythmias:Cardiac Arrest, Signs/Symptoms:Murmur and Chest Pain;                 Risk Factors:Current Smoker, Hypertension and Dyslipidemia.  Hypoxia.  Sonographer:    Roseanna Rainbow RDCS Referring Phys: [id number] [place]  Sonographer Comments: Technically difficult study due to poor echo windows. Image acquisition challenging due to uncooperative patient. Patient has spinal curvature, could not lay back. Difficult patient position. IMPRESSIONS  1. Left ventricular ejection fraction, by estimation, is 55 to 60%. The left ventricle has normal function. The left ventricle has no regional wall motion abnormalities. There is moderate concentric left ventricular hypertrophy. Left ventricular diastolic function could not be evaluated.  2. Right ventricular systolic function is normal. The right ventricular size is normal. There is moderately elevated pulmonary artery systolic pressure.  3. Left atrial size was moderately dilated.  4. Right atrial size was moderately dilated.  5. The mitral valve is normal in structure. Mild mitral valve regurgitation. No evidence of mitral stenosis.  6. Tricuspid valve regurgitation is mild to moderate.  7. The aortic valve is tricuspid. Aortic valve regurgitation is mild to moderate. Mild aortic valve stenosis.  8. The inferior vena cava is dilated in size with >50% respiratory variability, suggesting right atrial pressure of 8 mmHg. Comparison(s): No significant change from prior study. Prior images reviewed side by side. FINDINGS  Left Ventricle: Left ventricular ejection fraction, by estimation, is 55 to 60%. The left ventricle has normal function. The left ventricle has no  regional wall motion abnormalities. The left ventricular internal cavity size was normal in size. There is  moderate concentric left ventricular hypertrophy. Left ventricular diastolic function could not be evaluated due to atrial fibrillation. Left ventricular diastolic function could not be evaluated. Right Ventricle: The right ventricular size is normal. No increase in right ventricular wall thickness. Right ventricular systolic function is normal. There is moderately elevated pulmonary artery systolic pressure. The tricuspid regurgitant velocity is 3.14 m/s, and with an assumed right atrial pressure of 8 mmHg, the estimated right ventricular systolic pressure is 123XX123 mmHg. Left Atrium: Left atrial size was moderately dilated. Right Atrium: Right atrial size was moderately dilated. Pericardium: There is no evidence of pericardial effusion. Mitral Valve: The mitral valve is normal in structure. Mild mitral valve regurgitation, with centrally-directed jet. No evidence of mitral valve stenosis. Tricuspid Valve: The tricuspid valve is normal in structure. Tricuspid valve regurgitation is mild to moderate. Aortic Valve: The aortic valve is tricuspid. Aortic valve regurgitation is mild to moderate. Aortic regurgitation PHT measures 495 msec. Mild aortic stenosis is present. Aortic valve mean gradient measures 15.3 mmHg. Aortic valve peak gradient measures 28.8 mmHg. Aortic valve area, by VTI measures 1.78 cm. Pulmonic Valve: The pulmonic valve was normal in structure. Pulmonic valve regurgitation is not visualized. Aorta: The aortic root is normal in size and structure. Venous: The inferior vena cava is dilated in size with greater than 50% respiratory variability, suggesting right atrial pressure of 8 mmHg. IAS/Shunts: No atrial level shunt detected by color flow Doppler.  LEFT VENTRICLE PLAX 2D LVOT diam:     1.90 cm LV SV:         86 LV SV Index:   49 LVOT Area:     2.84 cm  LV Volumes (MOD) LV vol d, MOD A2C:  51.9 ml LV vol d, MOD A4C: 59.6 ml LV vol s, MOD A2C: 29.8 ml LV vol s, MOD A4C: 23.2 ml LV SV MOD A2C:     22.1 ml LV SV MOD A4C:     59.6 ml LV SV MOD BP:      30.6 ml IVC IVC diam: 2.51 cm  LEFT ATRIUM             Index       RIGHT ATRIUM           Index LA Vol (A2C):   89.1 ml 50.12 ml/m RA Area:     21.10 cm LA Vol (A4C):   37.1 ml 20.87 ml/m RA Volume:   63.20 ml  35.55 ml/m LA Biplane Vol: 58.0 ml 32.62 ml/m  AORTIC VALVE AV Area (Vmax):    1.74 cm AV Area (Vmean):   1.87 cm AV Area (VTI):     1.78 cm AV Vmax:           268.33 cm/s AV Vmean:          185.000 cm/s AV VTI:            0.486 m AV Peak Grad:      28.8 mmHg AV Mean Grad:      15.3 mmHg LVOT Vmax:         164.27 cm/s LVOT Vmean:        121.827 cm/s LVOT VTI:          0.304 m LVOT/AV VTI ratio: 0.63 AI PHT:            495 msec  AORTA Ao Asc diam: 3.50 cm MITRAL VALVE                TRICUSPID VALVE MV Area (PHT): 5.31 cm     TR Peak grad:   39.4 mmHg MV Decel Time: 143 msec     TR Vmax:        314.00 cm/s MV E velocity: 128.67 cm/s                             SHUNTS                             Systemic VTI:  0.30 m                             Systemic Diam: 1.90 cm Sanda Klein MD Electronically signed by Sanda Klein MD Signature Date/Time: 06/10/2019/11:33:24 AM    Final     Cardiac Studies   2D echo 06/10/2019 IMPRESSIONS   1. Left ventricular ejection fraction, by estimation, is 55 to 60%. The  left ventricle has normal function. The left ventricle has no regional  wall motion abnormalities. There is moderate concentric left ventricular  hypertrophy. Left ventricular  diastolic function could not be evaluated.  2. Right ventricular systolic function is normal. The right ventricular  size is normal. There is moderately elevated pulmonary artery systolic  pressure.  3. Left atrial size was moderately dilated.  4. Right atrial size was moderately dilated.  5. The mitral valve is normal in structure. Mild mitral valve    regurgitation. No evidence of mitral stenosis.  6. Tricuspid valve regurgitation is mild to moderate.  7. The aortic valve is tricuspid. Aortic valve regurgitation is mild to  moderate. Mild aortic valve stenosis.  8. The inferior vena cava is dilated in size with >50% respiratory  variability, suggesting right atrial pressure of 8 mmHg.   Patient Profile     74 y.o. 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 Lake Bells.  Assessment & Plan    Cardiac arrest - unclear etiology, patient was intubated 4/6 in the ED and extubated 06/08/19 - by description it sounds like PEA arrest - CT head showed small vessel disease, no acute infarct or hemorrhage - tele shows Afib RVR, however this is chronic and known for the patient - Labs show elevated D-dimer but chest CT neg for PE - possible sepsis with acidosis  - mildly elevated LFTS>>resolved - HS troponin 9. EKG shows Afib RVR and is nonischemic. Patient has low risk Myoview study in 2019. Patient does report intermittent exertional chest pain. If CT chest is negative for PE patient will likely need cath once pulmonary status improves - BNP 1,296. >> 2D echo with normal LVF  - Patient found to be septic started on IV abx unclear source  Afib RVR/chronic Afib s/p failed cardioversion x 3 - Started on IV dilt - Amiodarone drip.  - HR remains elevated - will give another bolus of Amio 150mg  - K+ goal >4, Mag goal >2 - TSH normal - continue Eliquis - moderate LAE on echo  Sepsis - no clear source - COVID negative. Respiratory panel negative - Blood culture with no growth - Lipase wnl - Lactic acid 6.9 on admission,  improving - WBC 13.0, up from yesterday - UA negative - procal wnl - CXR yesterday shows persistent effusions with bibasilar infiltrates/atelectasis - chest CT showed bibasilar patchy consolidative changes c/w atelectasis or PNA and scattered ground glass opacities in LUL which are new and could represent atypical PNA - abx per IM  Acute pulmonary edema - no mention of edema on chest CT but small bilateral effusions present - Lasix  Mild Aortic Stenosis - noted on echo along with mild to moderate AR  Hypokalemia -K+ 3.3 -replete K+ -check BMET in am    For questions or updates, please contact [facility] Please consult [email or web address] for contact info under Cardiology/STEMI.      Signed, Fransico Him, MD  06/10/2019, 6:56 PM

## 2019-06-10 NOTE — Progress Notes (Signed)
eLink Physician-Brief Progress Note Patient Name: Joann Ford DOB: 19-Aug-1945 MRN: [id number]   Date of Service  06/10/2019  HPI/Events of Note  K+ 3.3  eICU Interventions  KCL 10 meq iv Q 1 hour x 4 doses        Ornella Coderre U Rogerick Baldwin 06/10/2019, 4:51 AM

## 2019-06-10 NOTE — Progress Notes (Addendum)
SLP Cancellation Note  Patient Details Name: MARAINA GROSZ MRN: [id number] DOB: Mar 25, 1945   Cancelled treatment:       Reason Eval/Treat Not Completed: Patient not medically ready. On 15 L per RN, not stable. Plan for MBS this am to address overt signs of aspiration and risk of aspiration cancelled. May want to consider cortrak if respiratory status likely to fluctuate over the weekend. Will check on pt in the room later this am.   [name] 06/10/2019, 8:39 AM

## 2019-06-11 ENCOUNTER — Inpatient Hospital Stay (HOSPITAL_COMMUNITY): Payer: Medicare HMO

## 2019-06-11 LAB — GLUCOSE, CAPILLARY
Glucose-Capillary: 106 mg/dL — ABNORMAL HIGH (ref 70–99)
Glucose-Capillary: 118 mg/dL — ABNORMAL HIGH (ref 70–99)
Glucose-Capillary: 122 mg/dL — ABNORMAL HIGH (ref 70–99)
Glucose-Capillary: 123 mg/dL — ABNORMAL HIGH (ref 70–99)
Glucose-Capillary: 127 mg/dL — ABNORMAL HIGH (ref 70–99)
Glucose-Capillary: 161 mg/dL — ABNORMAL HIGH (ref 70–99)

## 2019-06-11 MED ORDER — ESCITALOPRAM OXALATE 20 MG PO TABS
20.0000 mg | ORAL_TABLET | Freq: Every day | ORAL | Status: DC
  Administered 2019-06-11 – 2019-06-16 (×5): 20 mg via ORAL
  Filled 2019-06-11 (×5): qty 1

## 2019-06-11 MED ORDER — ROSUVASTATIN CALCIUM 5 MG PO TABS
5.0000 mg | ORAL_TABLET | ORAL | Status: DC
  Administered 2019-06-11 – 2019-06-15 (×3): 5 mg via ORAL
  Filled 2019-06-11 (×3): qty 1

## 2019-06-11 MED ORDER — FUROSEMIDE 10 MG/ML IJ SOLN
40.0000 mg | Freq: Once | INTRAMUSCULAR | Status: AC
  Administered 2019-06-11: 40 mg via INTRAVENOUS
  Filled 2019-06-11: qty 4

## 2019-06-11 MED ORDER — POTASSIUM CHLORIDE CRYS ER 20 MEQ PO TBCR
40.0000 meq | EXTENDED_RELEASE_TABLET | Freq: Once | ORAL | Status: AC
  Administered 2019-06-11: 40 meq via ORAL
  Filled 2019-06-11: qty 2

## 2019-06-11 NOTE — Progress Notes (Signed)
NAME:  Joann Ford, MRN:  [id number], DOB:  1945/09/28, LOS: 4 ADMISSION DATE:  06/07/2019, CONSULTATION DATE:  4/6 REFERRING MD: Karle Starch, CHIEF COMPLAINT:  Dyspnea   Brief History   74 yo female presented to [facility] on 4/06 with malaise, confusion, hypoxia >> pt left AMA.  Brought to ER 4/07 with PEA cardiac arrest from pneumonia leading to respiratory failure.  Had ROSC in < 5 minutes.  Past Medical History  COPD, followed by Dr. 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  Patchy periventricular small vessel disease.  CT ABD/Pelvis 4/8 >> 7.4 x2 cm fluid collection in SQ tissues of thel lateral hip adjacent to the greater trochanter of the left femur  CTA Chest 4/8 >> limited study due to respiratory motion, no large central PE, bibasilar patchy consolidative changes with air bronchograms, scattered clusters of ground-glass subpleural density primarily involving the LUL, small bilateral pleural effusions, non-displaced sternal fracture, probable bilateral rib fractures  Echo 4/9 >> EF 55 to 60%, mod LVH, mod PASP elevation, mod LA/RA dilation, mild MR, mild AS  Micro Data:  COVID 4/6 >> negative  RVP 4/6 >> negative  BCx2 4/6 >>   Antimicrobials:  Vanco 4/6 x1  Aztreonam 4/6 >> 4/7  Azithro 4/6 >> 4/7  Doxy 4/7 >>   Interim history/subjective:  Bipap overnight.  Denies chest pain.    Objective   Blood pressure 134/75, pulse (!) 118, temperature 98.8 F (37.1 C), temperature source Bladder, resp. rate (!)  24, height 5\' 7"  (1.702 m), weight 67 kg, SpO2 92 %.    Vent Mode: BIPAP FiO2 (%):  [40 %] 40 % Set Rate:  [10 bmp] 10 bmp PEEP:  [6 cmH20] 6 cmH20   Intake/Output Summary (Last 24 hours) at 06/11/2019 0841 Last data filed at 06/10/2019 1800 Gross per 24 hour  Intake 1619.65 ml  Output 825 ml  Net 794.65 ml   Filed Weights   06/08/19 0121  Weight: 67 kg    Examination:  General - alert Eyes - pupils reactive ENT - Bipap mask on Cardiac - irregular Chest - scattered rhonchi Abdomen - soft, non tender, + bowel sounds Extremities - no cyanosis, clubbing, or edema Skin - no rashes Neuro - normal strength, moves extremities, follows commands Psych - normal mood and behavior   Resolved Hospital Problem list   Respiratory leading to PEA cardiac arrest, Elevated troponin from demand ischemia  Assessment & Plan:   Acute on chronic hypoxic respiratory failure from CAP in setting of restrictive lung disease from kyphoscoliosis and COPD, and acute pulmonary edema. - goal SpO2 90 to 95% - f/u CXR - continue brovana, pulmicort, and prn duoneb - day 5/7 of Abx, currently on doxycycline - lasix 40 mg IV x one on 4/10  A fib with RVR with hx of chronic a fib. Valvular heart disease. - amiodarone, cardizem gtt and lopressor per cardiology - continue eliquis - resume crestor  Hypoglycemia. - resolved - d/c D10 IV fluid  Chronic Pain -PRN morphine  - resume lexapro  Dysphagia. -  f/u with speech therapy   Best practice:  Diet: D1 diet with honey thick liquids DVT prophylaxis: eliquis GI prophylaxis: not indicated Mobility: PT/OT Code Status: DNR Disposition: ICU   Labs:   CMP Latest Ref Rng & Units 06/10/2019 06/09/2019 06/09/2019  Glucose 70 - 99 mg/dL 129(H) 131(H) 137(H)  BUN 8 - 23 mg/dL 8 10 12   Creatinine 0.44 - 1.00 mg/dL 0.52 0.56 0.60  Sodium 135 - 145 mmol/L 138 139 140  Potassium 3.5 - 5.1 mmol/L 3.3(L) 3.0(L) 3.1(L)  Chloride 98 - 111 mmol/L 98 100 105    CO2 22 - 32 mmol/L 30 32 28  Calcium 8.9 - 10.3 mg/dL 8.7(L) 8.5(L) 8.8(L)  Total Protein 6.5 - 8.1 g/dL 6.5 6.6 -  Total Bilirubin 0.3 - 1.2 mg/dL 1.3(H) 0.9 -  Alkaline Phos 38 - 126 U/L 63 68 -  AST 15 - 41 U/L 31 38 -  ALT 0 - 44 U/L 38 44 -    CBC Latest Ref Rng & Units 06/10/2019 06/09/2019 06/08/2019  WBC 4.0 - 10.5 K/uL 12.8(H) 13.0(H) 8.1  Hemoglobin 12.0 - 15.0 g/dL 13.2 13.4 12.6  Hematocrit 36.0 - 46.0 % 40.8 40.8 38.9  Platelets 150 - 400 K/uL 141(L) 147(L) 141(L)    ABG    Component Value Date/Time   PHART 7.445 06/08/2019 0417   PCO2ART 36.0 06/08/2019 0417   PO2ART 106 06/08/2019 0417   HCO3 24.3 06/08/2019 0417   ACIDBASEDEF 0.6 06/07/2019 1228   O2SAT 98.7 06/08/2019 0417    CBG (last 3)  Recent Labs    06/10/19 2311 06/11/19 0404 06/11/19 0837  GLUCAP 127* 122* 127*     Signature:  Chesley Mires, MD [place] Pager - [phone] 06/11/2019, 8:52 AM

## 2019-06-11 NOTE — Progress Notes (Addendum)
Progress Note  Patient Name: Joann Ford Date of Encounter: 06/11/2019  Primary Cardiologist: Pixie Casino, MD   Subjective   Remains in afib with CVR this am. Still has intermittent CP but cannot discern if due to underlying pulmonary dz  Inpatient Medications    Scheduled Meds: . apixaban  5 mg Oral BID  . arformoterol  15 mcg Nebulization BID  . budesonide (PULMICORT) nebulizer solution  0.5 mg Nebulization BID  . chlorhexidine  15 mL Mouth Rinse BID  . Chlorhexidine Gluconate Cloth  6 each Topical Daily  . escitalopram  20 mg Oral Daily  . mouth rinse  15 mL Mouth Rinse q12n4p  . metoprolol tartrate  10 mg Intravenous Q6H  . rosuvastatin  5 mg Oral QODAY   Continuous Infusions: . amiodarone 30 mg/hr (06/11/19 0307)  . diltiazem (CARDIZEM) infusion 15 mg/hr (06/10/19 1800)  . doxycycline (VIBRAMYCIN) IV Stopped (06/10/19 2347)   PRN Meds: acetaminophen, ipratropium-albuterol, morphine injection, ondansetron (ZOFRAN) IV, Resource ThickenUp Clear, sodium chloride flush   Vital Signs    Vitals:   06/11/19 0700 06/11/19 0800 06/11/19 0934 06/11/19 1000  BP: 114/63 134/75  (!) 118/59  Pulse: (!) 126 (!) 118 (!) 129 (!) 102  Resp: (!) 28 (!) 24 (!) 30 (!) 32  Temp: 99 F (37.2 C) 98.8 F (37.1 C) 99.7 F (37.6 C) 99.9 F (37.7 C)  TempSrc:  Bladder    SpO2: 94% 92% (!) 89% 93%  Weight:      Height:        Intake/Output Summary (Last 24 hours) at 06/11/2019 1041 Last data filed at 06/10/2019 1800 Gross per 24 hour  Intake 1619.65 ml  Output 825 ml  Net 794.65 ml   Filed Weights   06/08/19 0121  Weight: 67 kg    Telemetry    Atrial fibrillation with CVR in the 80's Personally Reviewed  ECG  No new EKG to review Personally Reviewed  Physical Exam   GEN: Well nourished, well developed in no acute distress HEENT: Normal NECK: No JVD; No carotid bruits LYMPHATICS: No lymphadenopathy CARDIAC:irregularly irregular, no murmurs, rubs,  gallops RESPIRATORY:  Diffuse rhonchi ABDOMEN: Soft, non-tender, non-distended MUSCULOSKELETAL:  No edema; No deformity  SKIN: Warm and dry NEUROLOGIC:  Alert and oriented x 3 PSYCHIATRIC:  Normal affect    Labs    Chemistry Recent Labs  Lab 06/09/19 0246 06/09/19 0246 06/09/19 0844 06/09/19 1336 06/10/19 0304  NA 143   < > 140 139 138  K 3.3*   < > 3.1* 3.0* 3.3*  CL 105   < > 105 100 98  CO2 28   < > 28 32 30  GLUCOSE 125*   < > 137* 131* 129*  BUN 15   < > 12 10 8   CREATININE 0.54   < > 0.60 0.56 0.52  CALCIUM 8.3*   < > 8.8* 8.5* 8.7*  PROT 6.2*  --   --  6.6 6.5  ALBUMIN 2.9*  --   --  3.1* 3.1*  AST 47*  --   --  38 31  ALT 46*  --   --  44 38  ALKPHOS 61  --   --  68 63  BILITOT 1.0  --   --  0.9 1.3*  GFRNONAA >60   < > >60 >60 >60  GFRAA >60   < > >60 >60 >60  ANIONGAP 10   < > 7 7 10    < > =  values in this interval not displayed.     Hematology Recent Labs  Lab 06/08/19 0622 06/09/19 0246 06/10/19 0304  WBC 8.1 13.0* 12.8*  RBC 4.01 4.31 4.32  HGB 12.6 13.4 13.2  HCT 38.9 40.8 40.8  MCV 97.0 94.7 94.4  MCH 31.4 31.1 30.6  MCHC 32.4 32.8 32.4  RDW 14.0 14.2 14.0  PLT 141* 147* 141*    Cardiac EnzymesNo results for input(s): TROPONINI in the last 168 hours. No results for input(s): TROPIPOC in the last 168 hours.   BNP Recent Labs  Lab 06/07/19 1317  BNP 1,296.5*     DDimer  Recent Labs  Lab 06/07/19 1920  DDIMER 3.74*     Radiology    CT ANGIO CHEST PE W OR WO CONTRAST  Result Date: 06/09/2019 CLINICAL DATA:  74 year old female with chest pain and hypoxia. History of COPD and cardiac arrest. Abdominal pain. EXAM: CT ANGIOGRAPHY CHEST CT ABDOMEN AND PELVIS WITH CONTRAST TECHNIQUE: Multidetector CT imaging of the chest was performed using the standard protocol during bolus administration of intravenous contrast. Multiplanar CT image reconstructions and MIPs were obtained to evaluate the vascular anatomy. Multidetector CT imaging of the  abdomen and pelvis was performed using the standard protocol during bolus administration of intravenous contrast. CONTRAST:  29mL OMNIPAQUE IOHEXOL 350 MG/ML SOLN COMPARISON:  Chest CT dated 01/21/2019. FINDINGS: Evaluation of this exam is limited due to respiratory motion artifact. Evaluation is also limited due to streak artifact caused by patient's arms. CTA CHEST FINDINGS Cardiovascular: Mild cardiomegaly. No pericardial effusion. There is coronary vascular calcification. There is moderate atherosclerotic calcification of the thoracic aorta. No aneurysmal dilatation or dissection. Evaluation of the pulmonary arteries is very limited due to respiratory motion artifact and suboptimal opacification of the peripheral branches. No large central pulmonary artery embolus identified. Mediastinum/Nodes: No definite hilar or mediastinal adenopathy. Evaluation however is limited due to respiratory motion artifact and consolidative changes of the lungs. The esophagus is grossly unremarkable. No mediastinal fluid collection. Lungs/Pleura: There are bibasilar patchy consolidative changes with air bronchogram which may represent atelectasis or pneumonia. Clinical correlation is recommended. Scattered clusters of ground-glass subpleural density primarily involving the left upper lobe noted which is new since the prior CT and concerning for atypical pneumonia. There are small bilateral pleural effusions. No pneumothorax. The central airways are patent. Musculoskeletal: There is a nondisplaced fracture of the body of the sternum, new since the prior CT. Mild compression fracture of the inferior endplate of T2 also new since the prior CT. There is thoracolumbar scoliosis. Probable bilateral rib fractures. Evaluation however is very limited due to motion artifact. Review of the MIP images confirms the above findings. CT ABDOMEN and PELVIS FINDINGS No intra-abdominal free air or free fluid. Hepatobiliary: The liver is unremarkable.  No intrahepatic biliary ductal dilatation. Probable gallstone. No pericholecystic fluid. Pancreas: Unremarkable. No pancreatic ductal dilatation or surrounding inflammatory changes. Spleen: Normal in size without focal abnormality. Adrenals/Urinary Tract: The adrenal glands are unremarkable. There is no hydronephrosis on either side. There is symmetric enhancement and excretion of contrast by both kidneys. The visualized ureters appear unremarkable. The urinary bladder is partially distended around a Foley catheter. Stomach/Bowel: There is sigmoid diverticulosis without active inflammatory changes. There is no bowel obstruction. Vascular/Lymphatic: Advanced aortoiliac atherosclerotic disease. The IVC is unremarkable. No portal venous gas. There is no adenopathy. Reproductive: The uterus is small, likely atrophic. No adnexal masses. Other: Diffuse subcutaneous edema. There is a 7.4 x 2.0 cm fluid collection in the subcutaneous  soft tissues of the left lateral hip adjacent to the greater trochanter of the left femur. Musculoskeletal: Osteopenia with scoliosis and degenerative changes of the spine. Bilateral hip osteoarthritis. No acute osseous pathology. Review of the MIP images confirms the above findings. IMPRESSION: 1. Very limited study due to respiratory motion artifact. No large central pulmonary artery embolus identified. 2. Bibasilar patchy consolidative changes with air bronchogram which may represent atelectasis or pneumonia. Clinical correlation is recommended. 3. Scattered clusters of ground-glass subpleural density primarily involving the left upper lobe is new since the prior CT and concerning for atypical pneumonia. Clinical correlation is recommended. 4. Small bilateral pleural effusions. 5. Nondisplaced fracture of the body of the sternum, new since the prior CT. Probable bilateral rib fractures. 6. Mild compression fracture of the inferior endplate of T2, also new since the prior CT. 7. Colonic  diverticulosis. No bowel obstruction. 8. A 7.4 x 2.0 cm fluid collection in the subcutaneous soft tissues of the left lateral hip adjacent to the greater trochanter of the left femur. 9. Aortic Atherosclerosis (ICD10-I70.0). Electronically Signed   By: Anner Crete M.D.   On: 06/09/2019 17:17   CT ABDOMEN PELVIS W CONTRAST  Result Date: 06/09/2019 CLINICAL DATA:  74 year old female with chest pain and hypoxia. History of COPD and cardiac arrest. Abdominal pain. EXAM: CT ANGIOGRAPHY CHEST CT ABDOMEN AND PELVIS WITH CONTRAST TECHNIQUE: Multidetector CT imaging of the chest was performed using the standard protocol during bolus administration of intravenous contrast. Multiplanar CT image reconstructions and MIPs were obtained to evaluate the vascular anatomy. Multidetector CT imaging of the abdomen and pelvis was performed using the standard protocol during bolus administration of intravenous contrast. CONTRAST:  72mL OMNIPAQUE IOHEXOL 350 MG/ML SOLN COMPARISON:  Chest CT dated 01/21/2019. FINDINGS: Evaluation of this exam is limited due to respiratory motion artifact. Evaluation is also limited due to streak artifact caused by patient's arms. CTA CHEST FINDINGS Cardiovascular: Mild cardiomegaly. No pericardial effusion. There is coronary vascular calcification. There is moderate atherosclerotic calcification of the thoracic aorta. No aneurysmal dilatation or dissection. Evaluation of the pulmonary arteries is very limited due to respiratory motion artifact and suboptimal opacification of the peripheral branches. No large central pulmonary artery embolus identified. Mediastinum/Nodes: No definite hilar or mediastinal adenopathy. Evaluation however is limited due to respiratory motion artifact and consolidative changes of the lungs. The esophagus is grossly unremarkable. No mediastinal fluid collection. Lungs/Pleura: There are bibasilar patchy consolidative changes with air bronchogram which may represent  atelectasis or pneumonia. Clinical correlation is recommended. Scattered clusters of ground-glass subpleural density primarily involving the left upper lobe noted which is new since the prior CT and concerning for atypical pneumonia. There are small bilateral pleural effusions. No pneumothorax. The central airways are patent. Musculoskeletal: There is a nondisplaced fracture of the body of the sternum, new since the prior CT. Mild compression fracture of the inferior endplate of T2 also new since the prior CT. There is thoracolumbar scoliosis. Probable bilateral rib fractures. Evaluation however is very limited due to motion artifact. Review of the MIP images confirms the above findings. CT ABDOMEN and PELVIS FINDINGS No intra-abdominal free air or free fluid. Hepatobiliary: The liver is unremarkable. No intrahepatic biliary ductal dilatation. Probable gallstone. No pericholecystic fluid. Pancreas: Unremarkable. No pancreatic ductal dilatation or surrounding inflammatory changes. Spleen: Normal in size without focal abnormality. Adrenals/Urinary Tract: The adrenal glands are unremarkable. There is no hydronephrosis on either side. There is symmetric enhancement and excretion of contrast by both kidneys. The visualized ureters  appear unremarkable. The urinary bladder is partially distended around a Foley catheter. Stomach/Bowel: There is sigmoid diverticulosis without active inflammatory changes. There is no bowel obstruction. Vascular/Lymphatic: Advanced aortoiliac atherosclerotic disease. The IVC is unremarkable. No portal venous gas. There is no adenopathy. Reproductive: The uterus is small, likely atrophic. No adnexal masses. Other: Diffuse subcutaneous edema. There is a 7.4 x 2.0 cm fluid collection in the subcutaneous soft tissues of the left lateral hip adjacent to the greater trochanter of the left femur. Musculoskeletal: Osteopenia with scoliosis and degenerative changes of the spine. Bilateral hip  osteoarthritis. No acute osseous pathology. Review of the MIP images confirms the above findings. IMPRESSION: 1. Very limited study due to respiratory motion artifact. No large central pulmonary artery embolus identified. 2. Bibasilar patchy consolidative changes with air bronchogram which may represent atelectasis or pneumonia. Clinical correlation is recommended. 3. Scattered clusters of ground-glass subpleural density primarily involving the left upper lobe is new since the prior CT and concerning for atypical pneumonia. Clinical correlation is recommended. 4. Small bilateral pleural effusions. 5. Nondisplaced fracture of the body of the sternum, new since the prior CT. Probable bilateral rib fractures. 6. Mild compression fracture of the inferior endplate of T2, also new since the prior CT. 7. Colonic diverticulosis. No bowel obstruction. 8. A 7.4 x 2.0 cm fluid collection in the subcutaneous soft tissues of the left lateral hip adjacent to the greater trochanter of the left femur. 9. Aortic Atherosclerosis (ICD10-I70.0). Electronically Signed   By: Anner Crete M.D.   On: 06/09/2019 17:17   DG CHEST PORT 1 VIEW  Result Date: 06/11/2019 CLINICAL DATA:  Hypoxia. EXAM: PORTABLE CHEST 1 VIEW COMPARISON:  06/09/2019 and chest CT 06/09/2019 FINDINGS: Lungs are hypoinflated demonstrate prominent hazy appearance of the perihilar vessels with mild opacification over the lateral aspect of the left upper lobe/apex. There is opacification over the lung bases compatible with known moderate atelectasis and pleural fluid. Moderate elevation of the right hemidiaphragm with prominent air-filled subdiaphragmatic loop of colon as seen on the recent CT scan. At least 1 lower left lateral rib fracture. Cardiomediastinal silhouette and remainder of the exam is unchanged. IMPRESSION: 1. Bibasilar opacification compatible with known moderate atelectasis and effusions. Patchy airspace process over the left upper lobe likely  infection. Suggestion of mild interstitial edema. 2. Interval worsening elevation right hemidiaphragm with prominent subdiaphragmatic air-filled loop of colon present. 3. At least 1 minimally displaced left lateral lower acute rib fracture. Electronically Signed   By: Marin Olp M.D.   On: 06/11/2019 08:15   ECHOCARDIOGRAM COMPLETE  Result Date: 06/10/2019    ECHOCARDIOGRAM REPORT   Patient Name:   Joann Ford Fenstermacher Date of Exam: 06/10/2019 Medical Rec #:  [id number]       Height:       67.0 in Accession #:    [id number]      Weight:       147.7 lb Date of Birth:  Sep 15, 1945       BSA:          1.778 m Patient Age:    74 years        BP:           126/80 mmHg Patient Gender: F               HR:           120 bpm. Exam Location:  Inpatient Procedure: 2D Echo, Cardiac Doppler and Color Doppler Indications:    Cardiac arrest  History:        Patient has prior history of Echocardiogram examinations, most                 recent 02/08/2018. Abnormal ECG, COPD, Aortic Valve Disease,                 Arrythmias:Cardiac Arrest, Signs/Symptoms:Murmur and Chest Pain;                 Risk Factors:Current Smoker, Hypertension and Dyslipidemia.                 Hypoxia.  Sonographer:    Roseanna Rainbow RDCS Referring Phys: [id number] Anderson  Sonographer Comments: Technically difficult study due to poor echo windows. Image acquisition challenging due to uncooperative patient. Patient has spinal curvature, could not lay back. Difficult patient position. IMPRESSIONS  1. Left ventricular ejection fraction, by estimation, is 55 to 60%. The left ventricle has normal function. The left ventricle has no regional wall motion abnormalities. There is moderate concentric left ventricular hypertrophy. Left ventricular diastolic function could not be evaluated.  2. Right ventricular systolic function is normal. The right ventricular size is normal. There is moderately elevated pulmonary artery systolic pressure.  3. Left atrial size was  moderately dilated.  4. Right atrial size was moderately dilated.  5. The mitral valve is normal in structure. Mild mitral valve regurgitation. No evidence of mitral stenosis.  6. Tricuspid valve regurgitation is mild to moderate.  7. The aortic valve is tricuspid. Aortic valve regurgitation is mild to moderate. Mild aortic valve stenosis.  8. The inferior vena cava is dilated in size with >50% respiratory variability, suggesting right atrial pressure of 8 mmHg. Comparison(s): No significant change from prior study. Prior images reviewed side by side. FINDINGS  Left Ventricle: Left ventricular ejection fraction, by estimation, is 55 to 60%. The left ventricle has normal function. The left ventricle has no regional wall motion abnormalities. The left ventricular internal cavity size was normal in size. There is  moderate concentric left ventricular hypertrophy. Left ventricular diastolic function could not be evaluated due to atrial fibrillation. Left ventricular diastolic function could not be evaluated. Right Ventricle: The right ventricular size is normal. No increase in right ventricular wall thickness. Right ventricular systolic function is normal. There is moderately elevated pulmonary artery systolic pressure. The tricuspid regurgitant velocity is 3.14 m/s, and with an assumed right atrial pressure of 8 mmHg, the estimated right ventricular systolic pressure is 123XX123 mmHg. Left Atrium: Left atrial size was moderately dilated. Right Atrium: Right atrial size was moderately dilated. Pericardium: There is no evidence of pericardial effusion. Mitral Valve: The mitral valve is normal in structure. Mild mitral valve regurgitation, with centrally-directed jet. No evidence of mitral valve stenosis. Tricuspid Valve: The tricuspid valve is normal in structure. Tricuspid valve regurgitation is mild to moderate. Aortic Valve: The aortic valve is tricuspid. Aortic valve regurgitation is mild to moderate. Aortic  regurgitation PHT measures 495 msec. Mild aortic stenosis is present. Aortic valve mean gradient measures 15.3 mmHg. Aortic valve peak gradient measures 28.8 mmHg. Aortic valve area, by VTI measures 1.78 cm. Pulmonic Valve: The pulmonic valve was normal in structure. Pulmonic valve regurgitation is not visualized. Aorta: The aortic root is normal in size and structure. Venous: The inferior vena cava is dilated in size with greater than 50% respiratory variability, suggesting right atrial pressure of 8 mmHg. IAS/Shunts: No atrial level shunt detected by color flow Doppler.  LEFT VENTRICLE PLAX 2D LVOT  diam:     1.90 cm LV SV:         86 LV SV Index:   49 LVOT Area:     2.84 cm  LV Volumes (MOD) LV vol d, MOD A2C: 51.9 ml LV vol d, MOD A4C: 59.6 ml LV vol s, MOD A2C: 29.8 ml LV vol s, MOD A4C: 23.2 ml LV SV MOD A2C:     22.1 ml LV SV MOD A4C:     59.6 ml LV SV MOD BP:      30.6 ml IVC IVC diam: 2.51 cm LEFT ATRIUM             Index       RIGHT ATRIUM           Index LA Vol (A2C):   89.1 ml 50.12 ml/m RA Area:     21.10 cm LA Vol (A4C):   37.1 ml 20.87 ml/m RA Volume:   63.20 ml  35.55 ml/m LA Biplane Vol: 58.0 ml 32.62 ml/m  AORTIC VALVE AV Area (Vmax):    1.74 cm AV Area (Vmean):   1.87 cm AV Area (VTI):     1.78 cm AV Vmax:           268.33 cm/s AV Vmean:          185.000 cm/s AV VTI:            0.486 m AV Peak Grad:      28.8 mmHg AV Mean Grad:      15.3 mmHg LVOT Vmax:         164.27 cm/s LVOT Vmean:        121.827 cm/s LVOT VTI:          0.304 m LVOT/AV VTI ratio: 0.63 AI PHT:            495 msec  AORTA Ao Asc diam: 3.50 cm MITRAL VALVE                TRICUSPID VALVE MV Area (PHT): 5.31 cm     TR Peak grad:   39.4 mmHg MV Decel Time: 143 msec     TR Vmax:        314.00 cm/s MV E velocity: 128.67 cm/s                             SHUNTS                             Systemic VTI:  0.30 m                             Systemic Diam: 1.90 cm Sanda Klein MD Electronically signed by Sanda Klein MD  Signature Date/Time: 06/10/2019/11:33:24 AM    Final     Cardiac Studies   2D echo 06/10/2019 IMPRESSIONS   1. Left ventricular ejection fraction, by estimation, is 55 to 60%. The  left ventricle has normal function. The left ventricle has no regional  wall motion abnormalities. There is moderate concentric left ventricular  hypertrophy. Left ventricular  diastolic function could not be evaluated.  2. Right ventricular systolic function is normal. The right ventricular  size is normal. There is moderately elevated pulmonary artery systolic  pressure.  3. Left atrial size was moderately dilated.  4. Right atrial size was moderately dilated.  5.  The mitral valve is normal in structure. Mild mitral valve  regurgitation. No evidence of mitral stenosis.  6. Tricuspid valve regurgitation is mild to moderate.  7. The aortic valve is tricuspid. Aortic valve regurgitation is mild to  moderate. Mild aortic valve stenosis.  8. The inferior vena cava is dilated in size with >50% respiratory  variability, suggesting right atrial pressure of 8 mmHg.   Patient Profile     74 y.o. 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 Lake Bells.  Assessment & Plan    Cardiac arrest - unclear etiology, patient was intubated 4/6 in the ED and extubated 06/08/19 - by description it sounds like PEA arrest from respiratory arrest - CT head showed small vessel disease, no acute infarct or hemorrhage - Chest CT neg for PE - tele showed Afib RVR, however this is chronic and known for the patient - likely sepsis with acidosis and worsening respiratory statue as etiology of PEA arrest - hsTroponin 9. EKG  showed  Afib RVR and is nonischemic. Patient has low risk Myoview study in 2019. Patient does report intermittent exertional chest pain.  - BNP 1,296. >> 2D echo with normal LVF  - Patient found to be septic started on IV abx unclear source  Afib RVR/chronic Afib s/p failed cardioversion x 3 - Started on IV dilt - Amiodarone drip.  - she has known chronic afib and HR much better controlled in the 80's this am  - K+ remains low at 3.3 yesterday >> replete >> K+ goal >4, Mag goal >2 - TSH normal - continue Eliquis, Cardizem gtt  - continue IV Amio gtt for now until respiratory status improved  - moderate LAE on echo  Sepsis - no clear source - COVID negative. Respiratory panel negative - Blood culture with no growth - Lipase wnl - Lactic acid 6.9 on admission, improving - WBC 13.0, up from yesterday - UA negative - procal wnl - CXR yesterday shows persistent effusions with bibasilar infiltrates/atelectasis - chest CT showed bibasilar patchy consolidative changes c/w atelectasis or PNA and scattered ground glass opacities in LUL which are new and could represent atypical PNA - abx per IM  Acute pulmonary edema - no mention of edema on chest CT but small bilateral effusions present - BNP>1200 -creatinine stable at 0.52 with diuresis - she put out 825cc yesterday and is net neg 1.746L since admit - Cxray today with patchy edema - will give Lasix 40mg  IV today  Mild Aortic Stenosis - noted on echo along with mild to moderate AR  Hypokalemia -K+ 3.3 yesterday -BMET pending this am -K+ repletion per CCM  I have spent a total of 35 minutes with patient reviewing hospital notes , telemetry, EKGs, labs and examining patient as well as establishing an assessment and plan that was discussed with the patient.  > 50% of time was spent in direct patient care.     For questions or updates, please contact [place] Please consult [email or web address] for contact info under  Cardiology/STEMI.      Signed, Fransico Him, MD  06/11/2019, 10:41 AM

## 2019-06-12 ENCOUNTER — Inpatient Hospital Stay (HOSPITAL_COMMUNITY): Payer: Medicare HMO

## 2019-06-12 LAB — GLUCOSE, CAPILLARY
Glucose-Capillary: 103 mg/dL — ABNORMAL HIGH (ref 70–99)
Glucose-Capillary: 104 mg/dL — ABNORMAL HIGH (ref 70–99)
Glucose-Capillary: 106 mg/dL — ABNORMAL HIGH (ref 70–99)
Glucose-Capillary: 112 mg/dL — ABNORMAL HIGH (ref 70–99)
Glucose-Capillary: 89 mg/dL (ref 70–99)
Glucose-Capillary: 93 mg/dL (ref 70–99)

## 2019-06-12 LAB — CBC
HCT: 38.2 % (ref 36.0–46.0)
Hemoglobin: 12.4 g/dL (ref 12.0–15.0)
MCH: 31.4 pg (ref 26.0–34.0)
MCHC: 32.5 g/dL (ref 30.0–36.0)
MCV: 96.7 fL (ref 80.0–100.0)
Platelets: 148 10*3/uL — ABNORMAL LOW (ref 150–400)
RBC: 3.95 MIL/uL (ref 3.87–5.11)
RDW: 14.3 % (ref 11.5–15.5)
WBC: 11.7 10*3/uL — ABNORMAL HIGH (ref 4.0–10.5)
nRBC: 0 % (ref 0.0–0.2)

## 2019-06-12 LAB — BASIC METABOLIC PANEL
Anion gap: 8 (ref 5–15)
BUN: 11 mg/dL (ref 8–23)
CO2: 28 mmol/L (ref 22–32)
Calcium: 8.4 mg/dL — ABNORMAL LOW (ref 8.9–10.3)
Chloride: 98 mmol/L (ref 98–111)
Creatinine, Ser: 0.55 mg/dL (ref 0.44–1.00)
GFR calc Af Amer: >60 mL/min (ref >60)
GFR calc non Af Amer: >60 mL/min (ref >60)
Glucose, Bld: 100 mg/dL — ABNORMAL HIGH (ref 70–99)
Potassium: 4.2 mmol/L (ref 3.5–5.1)
Sodium: 134 mmol/L — ABNORMAL LOW (ref 135–145)

## 2019-06-12 LAB — CULTURE, BLOOD (ROUTINE X 2)
Culture: NO GROWTH
Culture: NO GROWTH
Special Requests: ADEQUATE

## 2019-06-12 MED ORDER — LACTATED RINGERS IV BOLUS
250.0000 mL | Freq: Once | INTRAVENOUS | Status: AC
  Administered 2019-06-12: 250 mL via INTRAVENOUS

## 2019-06-12 NOTE — Progress Notes (Signed)
NAME:  Joann Ford, MRN:  [id number], DOB:  Jun 26, 1945, LOS: 5 ADMISSION DATE:  06/07/2019, CONSULTATION DATE:  4/6 REFERRING MD: Karle Starch, CHIEF COMPLAINT:  Dyspnea   Brief History   74 yo female presented to [facility]06 with malaise, confusion, hypoxia >> pt left AMA.  Brought to ER 4/07 with PEA cardiac arrest from pneumonia leading to respiratory failure.  Had ROSC in < 5 minutes.  Past Medical History  COPD, followed by Dr. 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  Patchy periventricular small vessel disease.  CT ABD/Pelvis 4/8 >> 7.4 x2 cm fluid collection in SQ tissues of thel lateral hip adjacent to the greater trochanter of the left femur  CTA Chest 4/8 >> limited study due to respiratory motion, no large central PE, bibasilar patchy consolidative changes with air bronchograms, scattered clusters of ground-glass subpleural density primarily involving the LUL, small bilateral pleural effusions, non-displaced sternal fracture, probable bilateral rib fractures  Echo 4/9 >> EF 55 to 60%, mod LVH, mod PASP elevation, mod LA/RA dilation, mild MR, mild AS  Micro Data:  COVID 4/6 >> negative  RVP 4/6 >> negative  BCx2 4/6 >>   Antimicrobials:  Vanco 4/6 x1  Aztreonam 4/6 >> 4/7  Azithro 4/6 >> 4/7  Doxy 4/7 >>   Interim history/subjective:  On Bipap overnight.  Denies chest pain.  Objective   Blood pressure (!) 145/59, pulse (!) 117, temperature 98.8 F (37.1 C), resp. rate (!) 25, height 5\' 7"   (1.702 m), weight 68.3 kg, SpO2 91 %.    Vent Mode: PCV;BIPAP FiO2 (%):  [40 %-50 %] 50 % Set Rate:  [10 bmp] 10 bmp PEEP:  [6 cmH20] 6 cmH20   Intake/Output Summary (Last 24 hours) at 06/12/2019 1003 Last data filed at 06/12/2019 0800 Gross per 24 hour  Intake 2107.81 ml  Output 1300 ml  Net 807.81 ml   Filed Weights   06/08/19 0121 06/12/19 0500  Weight: 67 kg 68.3 kg    Examination:  General - alert Eyes - pupils reactive ENT - raspy voice Cardiac - irregular Chest - scattered rhonchi Abdomen - soft, non tender, + bowel sounds Extremities - no cyanosis, clubbing, or edema Skin - no rashes Neuro - normal strength, moves extremities, follows commands Psych - normal mood and behavior   Resolved Hospital Problem list   Respiratory leading to PEA cardiac arrest, Elevated troponin from demand ischemia, Hypoglycemia  Assessment & Plan:   Acute on chronic hypoxic respiratory failure from CAP in setting of restrictive lung disease from kyphoscoliosis and COPD, and acute pulmonary edema. - goal SpO2 85 to 95% - f/u CXR intermittently - day 5/7 of ABx, currently on doxcycline - continue brovana, pulmicort, prn duoneb  A fib with RVR with hx of chronic a fib. Valvular heart disease. - amiodarone, cardizem gtt and lopressor per cardiology - continue eliquis, crestor  Chronic Pain -PRN morphine  - continue lexapro  Dysphagia. - f/u with speech - might need panda tube and tube feeds if she is unable to maintain  caloric intake otherwise   Best practice:  Diet: D1 diet with honey thick liquids DVT prophylaxis: eliquis GI prophylaxis: not indicated Mobility: PT/OT Code Status: DNR Disposition: ICU   Labs:   CMP Latest Ref Rng & Units 06/12/2019 06/10/2019 06/09/2019  Glucose 70 - 99 mg/dL 100(H) 129(H) 131(H)  BUN 8 - 23 mg/dL 11 8 10   Creatinine 0.44 - 1.00 mg/dL 0.55 0.52 0.56  Sodium 135 - 145 mmol/L 134(L) 138 139  Potassium 3.5 - 5.1 mmol/L 4.2 3.3(L) 3.0(L)   Chloride 98 - 111 mmol/L 98 98 100  CO2 22 - 32 mmol/L 28 30 32  Calcium 8.9 - 10.3 mg/dL 8.4(L) 8.7(L) 8.5(L)  Total Protein 6.5 - 8.1 g/dL - 6.5 6.6  Total Bilirubin 0.3 - 1.2 mg/dL - 1.3(H) 0.9  Alkaline Phos 38 - 126 U/L - 63 68  AST 15 - 41 U/L - 31 38  ALT 0 - 44 U/L - 38 44    CBC Latest Ref Rng & Units 06/12/2019 06/10/2019 06/09/2019  WBC 4.0 - 10.5 K/uL 11.7(H) 12.8(H) 13.0(H)  Hemoglobin 12.0 - 15.0 g/dL 12.4 13.2 13.4  Hematocrit 36.0 - 46.0 % 38.2 40.8 40.8  Platelets 150 - 400 K/uL 148(L) 141(L) 147(L)    ABG    Component Value Date/Time   PHART 7.445 06/08/2019 0417   PCO2ART 36.0 06/08/2019 0417   PO2ART 106 06/08/2019 0417   HCO3 24.3 06/08/2019 0417   ACIDBASEDEF 0.6 06/07/2019 1228   O2SAT 98.7 06/08/2019 0417    CBG (last 3)  Recent Labs    06/11/19 2346 06/12/19 0342 06/12/19 0743  GLUCAP 106* 112* 104*     Signature:   Chesley Mires, MD [place] Pager - [phone] 06/12/2019, 10:06 AM

## 2019-06-12 NOTE — Progress Notes (Signed)
PT Cancellation Note  Patient Details Name: Joann Ford MRN: [id number] DOB: [birth year]   Cancelled Treatment:     PT deferred this date at request of RN - pt SOB and just placed back on bi-pap.  Will follow.   Analiz Tvedt 06/12/2019, 10:46 AM

## 2019-06-12 NOTE — Progress Notes (Signed)
eLink Physician-Brief Progress Note Patient Name: Joann Ford DOB: [birth year] MRN: [id number]   Date of Service  06/12/2019  HPI/Events of Note  Oliguria  eICU Interventions  LR 250 ml fluid bolus x 1        Masie Bermingham U Kevonna Nolte 06/12/2019, 6:19 AM

## 2019-06-13 LAB — GLUCOSE, CAPILLARY
Glucose-Capillary: 103 mg/dL — ABNORMAL HIGH (ref 70–99)
Glucose-Capillary: 114 mg/dL — ABNORMAL HIGH (ref 70–99)
Glucose-Capillary: 123 mg/dL — ABNORMAL HIGH (ref 70–99)
Glucose-Capillary: 130 mg/dL — ABNORMAL HIGH (ref 70–99)
Glucose-Capillary: 167 mg/dL — ABNORMAL HIGH (ref 70–99)
Glucose-Capillary: 96 mg/dL (ref 70–99)

## 2019-06-13 LAB — CBC
HCT: 40.9 % (ref 36.0–46.0)
Hemoglobin: 13 g/dL (ref 12.0–15.0)
MCH: 30.2 pg (ref 26.0–34.0)
MCHC: 31.8 g/dL (ref 30.0–36.0)
MCV: 95.1 fL (ref 80.0–100.0)
Platelets: 140 10*3/uL — ABNORMAL LOW (ref 150–400)
RBC: 4.3 MIL/uL (ref 3.87–5.11)
RDW: 14.4 % (ref 11.5–15.5)
WBC: 13.4 10*3/uL — ABNORMAL HIGH (ref 4.0–10.5)
nRBC: 0 % (ref 0.0–0.2)

## 2019-06-13 LAB — BASIC METABOLIC PANEL
Anion gap: 14 (ref 5–15)
BUN: 12 mg/dL (ref 8–23)
CO2: 26 mmol/L (ref 22–32)
Calcium: 8.6 mg/dL — ABNORMAL LOW (ref 8.9–10.3)
Chloride: 97 mmol/L — ABNORMAL LOW (ref 98–111)
Creatinine, Ser: 0.44 mg/dL (ref 0.44–1.00)
GFR calc Af Amer: >60 mL/min (ref >60)
GFR calc non Af Amer: >60 mL/min (ref >60)
Glucose, Bld: 93 mg/dL (ref 70–99)
Potassium: 4.7 mmol/L (ref 3.5–5.1)
Sodium: 137 mmol/L (ref 135–145)

## 2019-06-13 MED ORDER — FUROSEMIDE 10 MG/ML IJ SOLN
40.0000 mg | Freq: Two times a day (BID) | INTRAMUSCULAR | Status: DC
  Administered 2019-06-13: 40 mg via INTRAVENOUS
  Filled 2019-06-13: qty 4

## 2019-06-13 MED ORDER — DILTIAZEM HCL 60 MG PO TABS
60.0000 mg | ORAL_TABLET | Freq: Four times a day (QID) | ORAL | Status: DC
  Administered 2019-06-13 – 2019-06-14 (×4): 60 mg via ORAL
  Filled 2019-06-13 (×4): qty 1

## 2019-06-13 MED ORDER — METHYLPREDNISOLONE SODIUM SUCC 40 MG IJ SOLR
40.0000 mg | Freq: Every day | INTRAMUSCULAR | Status: DC
  Administered 2019-06-13 – 2019-06-15 (×3): 40 mg via INTRAVENOUS
  Filled 2019-06-13 (×3): qty 1

## 2019-06-13 MED ORDER — FUROSEMIDE 10 MG/ML IJ SOLN
80.0000 mg | Freq: Two times a day (BID) | INTRAMUSCULAR | Status: DC
  Administered 2019-06-13 – 2019-06-16 (×8): 80 mg via INTRAVENOUS
  Filled 2019-06-13 (×8): qty 8

## 2019-06-13 NOTE — Progress Notes (Signed)
Progress Note  Patient Name: Joann Ford Date of Encounter: 06/13/2019  Primary Cardiologist: Pixie Casino, MD   Subjective   Patient transitions from Bipap to NRB. Breathing is appears mildly labored. No chest pain. Plans to consult palliative care.   Patient put out 930mL urine overnight. Remains in afib rates around 100bpm.   Inpatient Medications    Scheduled Meds: . apixaban  5 mg Oral BID  . arformoterol  15 mcg Nebulization BID  . budesonide (PULMICORT) nebulizer solution  0.5 mg Nebulization BID  . chlorhexidine  15 mL Mouth Rinse BID  . Chlorhexidine Gluconate Cloth  6 each Topical Daily  . escitalopram  20 mg Oral Daily  . furosemide  40 mg Intravenous Q12H  . mouth rinse  15 mL Mouth Rinse q12n4p  . methylPREDNISolone (SOLU-MEDROL) injection  40 mg Intravenous Daily  . metoprolol tartrate  10 mg Intravenous Q6H  . rosuvastatin  5 mg Oral QODAY   Continuous Infusions: . amiodarone 30 mg/hr (06/13/19 0303)  . diltiazem (CARDIZEM) infusion 10 mg/hr (06/13/19 0620)  . doxycycline (VIBRAMYCIN) IV Stopped (06/12/19 2358)   PRN Meds: acetaminophen, ipratropium-albuterol, morphine injection, ondansetron (ZOFRAN) IV, Resource ThickenUp Clear, sodium chloride flush   Vital Signs    Vitals:   06/13/19 0500 06/13/19 0600 06/13/19 0825 06/13/19 0924  BP: 133/60 (!) 127/57    Pulse: (!) 105 77  (!) 107  Resp: (!) 26 (!) 27  (!) 30  Temp: 100.2 F (37.9 C) 99.5 F (37.5 C)  100 F (37.8 C)  TempSrc:      SpO2: 94% 95% 94% 92%  Weight:      Height:        Intake/Output Summary (Last 24 hours) at 06/13/2019 1002 Last data filed at 06/13/2019 0535 Gross per 24 hour  Intake 923.14 ml  Output 900 ml  Net 23.14 ml   Last 3 Weights 06/13/2019 06/12/2019 06/08/2019  Weight (lbs) 146 lb 9.7 oz 150 lb 9.2 oz 147 lb 11.3 oz  Weight (kg) 66.5 kg 68.3 kg 67 kg      Telemetry    Afib, Hr 90-110, PVCs - Personally Reviewed  ECG    No new - Personally  Reviewed  Physical Exam   GEN: No acute distress.   Neck: No JVD Cardiac: Irreg Irreg, no murmurs, rubs, or gallops.  Respiratory: Crackles and wheezing, diminished at bases GI: Soft, nontender, non-distended  MS: No edema; No deformity. Neuro:  Nonfocal  Psych: Normal affect   Labs    High Sensitivity Troponin:   Recent Labs  Lab 06/07/19 1317  TROPONINIHS 9      Chemistry Recent Labs  Lab 06/09/19 0246 06/09/19 0844 06/09/19 1336 06/09/19 1336 06/10/19 0304 06/12/19 0307 06/13/19 0257  NA 143   < > 139   < > 138 134* 137  K 3.3*   < > 3.0*   < > 3.3* 4.2 4.7  CL 105   < > 100   < > 98 98 97*  CO2 28   < > 32   < > 30 28 26   GLUCOSE 125*   < > 131*   < > 129* 100* 93  BUN 15   < > 10   < > 8 11 12   CREATININE 0.54   < > 0.56   < > 0.52 0.55 0.44  CALCIUM 8.3*   < > 8.5*   < > 8.7* 8.4* 8.6*  PROT 6.2*  --  6.6  --  6.5  --   --   ALBUMIN 2.9*  --  3.1*  --  3.1*  --   --   AST 47*  --  38  --  31  --   --   ALT 46*  --  44  --  38  --   --   ALKPHOS 61  --  68  --  63  --   --   BILITOT 1.0  --  0.9  --  1.3*  --   --   GFRNONAA >60   < > >60   < > >60 >60 >60  GFRAA >60   < > >60   < > >60 >60 >60  ANIONGAP 10   < > 7   < > 10 8 14    < > = values in this interval not displayed.     Hematology Recent Labs  Lab 06/10/19 0304 06/12/19 0307 06/13/19 0257  WBC 12.8* 11.7* 13.4*  RBC 4.32 3.95 4.30  HGB 13.2 12.4 13.0  HCT 40.8 38.2 40.9  MCV 94.4 96.7 95.1  MCH 30.6 31.4 30.2  MCHC 32.4 32.5 31.8  RDW 14.0 14.3 14.4  PLT 141* 148* 140*    BNP Recent Labs  Lab 06/07/19 1317  BNP 1,296.5*     DDimer  Recent Labs  Lab 06/07/19 1920  DDIMER 3.74*     Radiology    DG Chest Port 1 View  Result Date: 06/12/2019 CLINICAL DATA:  Respiratory failure EXAM: PORTABLE CHEST 1 VIEW COMPARISON:  radiograph 06/11/2019 FINDINGS: Stable enlarged cardiac silhouette. Low lung volumes and bilateral pleural effusion. Upper lobe airspace disease is similar.  No pneumothorax. IMPRESSION: 1. No interval change. 2. Cardiomegaly, bibasilar atelectasis, low lung volumes with bilateral pleural effusions. 3. Upper lobe airspace disease suggesting pulmonary edema. Electronically Signed   By: Suzy Bouchard M.D.   On: 06/12/2019 08:00    Cardiac Studies   2D echo 06/10/2019 IMPRESSIONS   1. Left ventricular ejection fraction, by estimation, is 55 to 60%. The  left ventricle has normal function. The left ventricle has no regional  wall motion abnormalities. There is moderate concentric left ventricular  hypertrophy. Left ventricular  diastolic function could not be evaluated.  2. Right ventricular systolic function is normal. The right ventricular  size is normal. There is moderately elevated pulmonary artery systolic  pressure.  3. Left atrial size was moderately dilated.  4. Right atrial size was moderately dilated.  5. The mitral valve is normal in structure. Mild mitral valve  regurgitation. No evidence of mitral stenosis.  6. Tricuspid valve regurgitation is mild to moderate.  7. The aortic valve is tricuspid. Aortic valve regurgitation is mild to  moderate. Mild aortic valve stenosis.  8. The inferior vena cava is dilated in size with >50% respiratory  variability, suggesting right atrial pressure of 8 mmHg.   Patient Profile     74 y.o. female with a hx of COPD with O2 use athome, restrictive lung disease due to scoliosis,mild aortic stenosis, coronary arterycalcification seen on CT 01/2019,hyperlipidemia, atrial fibrillations/p failed cardioversion x 3 on Eliquis,anxiety, arthritis, marked thoracic kyphosiswho is being seen for the evaluation of cardiac arrestat the request of Dr. Lake Bells.  Assessment & Plan    Cardiac arrest - unclear etiology, patient was intubated 4/6 in the ED and extubated 06/08/19 - by description it sounds like PEA arrest from respiratory arrest - likely sepsis with acidosis and worsening respiratory  statue as etiology of PEA  arrest - CT head showed small vessel disease, no acute infarct or hemorrhage - Chest CT neg for PE - tele showed Afib RVR, however this is chronic and known for the patient - hsTroponin 9. EKG showed  Afib RVR and is nonischemic. Patient has low risk Myoview study in 2019. Patient does report intermittent exertional chest pain.  - BNP 1,296. >> 2D echo with normal LVF  - IV abx for sepsis - Patient was on Bipap the last 3 days for respiratory failure>>Goals of care discussion with palliative care. Patient is DNR  CAP/Restrictive Lung disease/COPD - per Imaging - IV abx per IM - WBC 11.7>13.4 - Respiratory status became worse over the weekend on Bipap, steroids, duonebs, lasix. Bipap changed to NRB today   Afib RVR/chronic Afib s/p failed cardioversion x 3 - Started on IV dilt - Amiodarone drip.  - Patient remains in Afib, heart rates steady around 100 - K+ 4.7 >> K+ goal >4, Mag goal >2 - TSH normal - continue Eliquis, Cardizem gtt, and Lopressor  Sepsis - suspected from CAP - COVID negative. Respiratory panel negative - Blood culture with no growth - Lipase wnl - Lactic acid 6.9 on admission, improving - WBC 13.0, up from yesterday - UA negative - procal wnl - CXR shows persistent effusions with bibasilar infiltrates/atelectasis - chest CT showed bibasilar patchy consolidative changes c/w atelectasis or PNA and scattered ground glass opacities in LUL which are new and could represent atypical PNA - abx per IM  Acute pulmonary edema - no mention of edema on chest CT but small bilateral effusions present.  - Cxray today with patchy edema - BNP>1200 - Lasix 40mg  IV BID - patient put out 900 mL urine overnight. Weight down 1lb  Mild Aortic Stenosis - noted on echo along with mild to moderate AR  Hypokalemia -K+ 4.7  For questions or updates, please contact [facility] Please consult [email or web address] for contact info under          Signed, Barney Gertsch Ninfa Meeker, PA-C  06/13/2019, 10:02 AM

## 2019-06-13 NOTE — Progress Notes (Signed)
NAME:  Joann Ford, MRN:  [id number], DOB:  1945-07-28, LOS: 6 ADMISSION DATE:  06/07/2019, CONSULTATION DATE:  4/6 REFERRING MD: Karle Starch, CHIEF COMPLAINT:  Dyspnea   Brief History   74 yo female [facility] with malaise, confusion, hypoxia >> pt left AMA.  Brought to ER 4/07 with PEA cardiac arrest from pneumonia leading to respiratory failure.  Had ROSC in < 5 minutes.  Past Medical History  COPD, followed by Dr. 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  Patchy periventricular small vessel disease.  CT ABD/Pelvis 4/8 >> 7.4 x2 cm fluid collection in SQ tissues of thel lateral hip adjacent to the greater trochanter of the left femur  CTA Chest 4/8 >> limited study due to respiratory motion, no large central PE, bibasilar patchy consolidative changes with air bronchograms, scattered clusters of ground-glass subpleural density primarily involving the LUL, small bilateral pleural effusions, non-displaced sternal fracture, probable bilateral rib fractures  Echo 4/9 >> EF 55 to 60%, mod LVH, mod PASP elevation, mod LA/RA dilation, mild MR, mild AS  Micro Data:  COVID 4/6 >> negative  RVP 4/6 >> negative  BCx2 4/6 >>   Antimicrobials:  Vanco 4/6 x1  Aztreonam 4/6 >> 4/7  Azithro 4/6 >> 4/7  Doxy 4/7 >>   Interim history/subjective:   Continues on BiPAP.  Has become tachypneic with increased work of breathing immediately after taking off positive airway pressure  Objective    Blood pressure (!) 127/57, pulse 77, temperature 99.5 F (37.5 C), resp. rate (!) 27, height 5\' 7"  (1.702 m), weight 66.5 kg, SpO2 94 %.    Vent Mode: BIPAP FiO2 (%):  [60 %] 60 % Set Rate:  [10 bmp] 10 bmp PEEP:  [6 cmH20] 6 cmH20   Intake/Output Summary (Last 24 hours) at 06/13/2019 0837 Last data filed at 06/13/2019 0535 Gross per 24 hour  Intake 923.14 ml  Output 900 ml  Net 23.14 ml   Filed Weights   06/08/19 0121 06/12/19 0500 06/13/19 0402  Weight: 67 kg 68.3 kg 66.5 kg    Examination: Gen:      Frail, elderly HEENT:  EOMI, sclera anicteric Neck:     No masses; no thyromegaly Lungs:    Scattered rhonchi upper airway wheeze CV:         Regular rate and rhythm; no murmurs Abd:      + bowel sounds; soft, non-tender; no palpable masses, no distension Ext:    No edema; adequate peripheral perfusion Skin:      Warm and dry; no rash Neuro: Awake, responsive  Lab significant for elevated WBC count of 13.4 No new imaging   Resolved Hospital Problem list   Respiratory leading to PEA cardiac arrest, Elevated troponin from demand ischemia, Hypoglycemia  Assessment & Plan:   Acute on chronic hypoxic respiratory failure from CAP in setting of restrictive lung disease from kyphoscoliosis and COPD, and acute pulmonary edema. Continue Bipap Intermittent chest x-ray Continue antibiotics.  On day 6/7 of doxycycline Brovana, Pulmicort, as  needed duo nebs We will give Lasix and low-dose prednisone to help with respiratory status   A fib with RVR with hx of chronic a fib. Valvular heart disease. Continue amiodarone, Cardizem drip and Lopressor per cardiology Continue Eliquis, Crestor  Chronic Pain PRN morphine  Continue lexapro  Dysphagia. Severe malnutrition due to poor p.o. intake f/u with speech might need panda tube and tube feeds if she is unable to maintain caloric intake otherwise  Goals of care Has been BiPAP dependent for the past 3 days.  Discussed overall  trajectory and concern that she is not doing well with poor prognosis for recovery. CODE STATUS is DNR.  She is not sure if she wants supplemental nutrition via feeding tube. Discussed comfort measures which she is considering but not ready to make the decision yet Husband and son were at bedside during these discussions We will get palliative care involved.  Best practice:  Diet: D1 diet with honey thick liquids DVT prophylaxis: eliquis GI prophylaxis: not indicated Mobility: PT/OT Code Status: DNR Disposition: ICU   Labs:   CMP Latest Ref Rng & Units 06/13/2019 06/12/2019 06/10/2019  Glucose 70 - 99 mg/dL 93 100(H) 129(H)  BUN 8 - 23 mg/dL 12 11 8   Creatinine 0.44 - 1.00 mg/dL 0.44 0.55 0.52  Sodium 135 - 145 mmol/L 137 134(L) 138  Potassium 3.5 - 5.1 mmol/L 4.7 4.2 3.3(L)  Chloride 98 - 111 mmol/L 97(L) 98 98  CO2 22 - 32 mmol/L 26 28 30   Calcium 8.9 - 10.3 mg/dL 8.6(L) 8.4(L) 8.7(L)  Total Protein 6.5 - 8.1 g/dL - - 6.5  Total Bilirubin 0.3 - 1.2 mg/dL - - 1.3(H)  Alkaline Phos 38 - 126 U/L - - 63  AST 15 - 41 U/L - - 31  ALT 0 - 44 U/L - - 38    CBC Latest Ref Rng & Units 06/13/2019 06/12/2019 06/10/2019  WBC 4.0 - 10.5 K/uL 13.4(H) 11.7(H) 12.8(H)  Hemoglobin 12.0 - 15.0 g/dL 13.0 12.4 13.2  Hematocrit 36.0 - 46.0 % 40.9 38.2 40.8  Platelets 150 - 400 K/uL 140(L) 148(L) 141(L)    ABG    Component Value Date/Time   PHART 7.445 06/08/2019 0417   PCO2ART 36.0 06/08/2019 0417   PO2ART 106 06/08/2019 0417   HCO3 24.3 06/08/2019 0417   ACIDBASEDEF 0.6 06/07/2019 1228   O2SAT 98.7 06/08/2019 0417    CBG (last 3)  Recent Labs    06/12/19 1947 06/12/19 2356 06/13/19 0322  GLUCAP 106* 89 96     Signature:   The patient is critically ill with multiple organ system failure and requires high complexity decision making for assessment and support, frequent evaluation and titration of therapies, advanced monitoring, review of radiographic studies and interpretation of  complex data.   Critical Care Time devoted to patient care services, exclusive of separately billable procedures, described in this note is 35 minutes.   Marshell Garfinkel MD [facility] Please see [email or web address] for pager details.  06/13/2019, 9:18 AM

## 2019-06-13 NOTE — Progress Notes (Signed)
  Speech Language Pathology Treatment: Dysphagia  Patient Details Name: Joann Ford MRN: [id number] DOB: [birth year] Today's Date: 06/13/2019 Time: [id number] SLP Time Calculation (min) (ACUTE ONLY): 30 min  Assessment / Plan / Recommendation Clinical Impression  Pt seen and exhibits significantly decreased respiratory status that has worsened over weekend corresponding to initiation of food/solids (which is not surprising to this SLP). On Bipap when therapist arrived with RN at bedside and found lips coated when adjusting Bipap mask. RN switched pt to non rebreather as therapist able to extract copious thick, dried mucous from hard/soft palate and lips- cleared completely. SpO2 ranged 82-93% during session. Pt's status/situation appears to be possibly moving toward comfort. Pt loves and requested Coke and therapist gave trial of thin (unthickened) Coke via straw dipping non rebreather for sip. Pt dyspneic throughout session prior and during po trials. She did not cough or throat clear however aspiration risk high with poor respiratory status. She declined puree trials. Pt continued to request sips. Pt's son and husband arrived and therapist educated/explained current situation with breathing/swallowing- Dr. Vaughan Browner then arrived and SLP updated re: session and po status. MD plans to consult Palliative care. ST will keep order as puree (Dys 1), honey thick and allow pt thin liquids PRN in upright position and small sips. Continue intense oral care. Will follow for Palliative care meeting results, further education.     HPI HPI: 74 y/o female with COPD, scoliosis admitted on 4/6 after being brought into the [facility] and developing a cardaic arrest.  Previously in the day she presented to the [place] ED with non-specific complaints including malaise, falls, hallucintations.  Was noted to be hypoxemic, they recommended admission but she left AMA. She was brought to the [facility] ED by EMS today and on  arrival was noted to be pulsess. Underwent standard resuscitation via ACLS protocol for < 5 minutes, was intubated, regained a pulse. Intubated 4/6-4/7.       SLP Plan  Continue with current plan of care       Recommendations  Diet recommendations: Dysphagia 1 (puree);Honey-thick liquid;Other(comment)(sips thin for comfort) Liquids provided via: Cup;Straw Medication Administration: Crushed with puree Supervision: Staff to assist with self feeding;Full supervision/cueing for compensatory strategies Compensations: Slow rate;Small sips/bites;Other (Comment)(pace/rest breaks for respiration ) Postural Changes and/or Swallow Maneuvers: Seated upright 90 degrees                Oral Care Recommendations: Oral care QID Follow up Recommendations: 24 hour supervision/assistance SLP Visit Diagnosis: Dysphagia, unspecified (R13.10) Plan: Continue with current plan of care       GO                Houston Siren 06/13/2019, 9:31 AM   Orbie Pyo Colvin Caroli.Ed Risk analyst [phone] Office [phone]

## 2019-06-14 ENCOUNTER — Inpatient Hospital Stay (HOSPITAL_COMMUNITY): Payer: Medicare HMO

## 2019-06-14 DIAGNOSIS — Z7189 Other specified counseling: Secondary | ICD-10-CM | POA: Diagnosis not present

## 2019-06-14 DIAGNOSIS — R0602 Shortness of breath: Secondary | ICD-10-CM | POA: Diagnosis not present

## 2019-06-14 DIAGNOSIS — Z515 Encounter for palliative care: Secondary | ICD-10-CM

## 2019-06-14 LAB — MAGNESIUM: Magnesium: 1.9 mg/dL (ref 1.7–2.4)

## 2019-06-14 LAB — GLUCOSE, CAPILLARY
Glucose-Capillary: 122 mg/dL — ABNORMAL HIGH (ref 70–99)
Glucose-Capillary: 109 mg/dL — ABNORMAL HIGH (ref 70–99)
Glucose-Capillary: 140 mg/dL — ABNORMAL HIGH (ref 70–99)
Glucose-Capillary: 116 mg/dL — ABNORMAL HIGH (ref 70–99)
Glucose-Capillary: 159 mg/dL — ABNORMAL HIGH (ref 70–99)

## 2019-06-14 LAB — BASIC METABOLIC PANEL
Anion gap: 13 (ref 5–15)
Anion gap: 10 (ref 5–15)
BUN: 18 mg/dL (ref 8–23)
BUN: 21 mg/dL (ref 8–23)
CO2: 32 mmol/L (ref 22–32)
CO2: 35 mmol/L — ABNORMAL HIGH (ref 22–32)
Calcium: 8.5 mg/dL — ABNORMAL LOW (ref 8.9–10.3)
Calcium: 8.4 mg/dL — ABNORMAL LOW (ref 8.9–10.3)
Chloride: 94 mmol/L — ABNORMAL LOW (ref 98–111)
Chloride: 95 mmol/L — ABNORMAL LOW (ref 98–111)
Creatinine, Ser: 0.64 mg/dL (ref 0.44–1.00)
Creatinine, Ser: 0.59 mg/dL (ref 0.44–1.00)
GFR calc Af Amer: >60 mL/min (ref >60)
GFR calc Af Amer: >60 mL/min (ref >60)
GFR calc non Af Amer: >60 mL/min (ref >60)
GFR calc non Af Amer: >60 mL/min (ref >60)
Glucose, Bld: 120 mg/dL — ABNORMAL HIGH (ref 70–99)
Glucose, Bld: 105 mg/dL — ABNORMAL HIGH (ref 70–99)
Potassium: 3.1 mmol/L — ABNORMAL LOW (ref 3.5–5.1)
Potassium: 3.6 mmol/L (ref 3.5–5.1)
Sodium: 139 mmol/L (ref 135–145)
Sodium: 140 mmol/L (ref 135–145)

## 2019-06-14 LAB — CBC
HCT: 39.7 % (ref 36.0–46.0)
Hemoglobin: 12.7 g/dL (ref 12.0–15.0)
MCH: 30.5 pg (ref 26.0–34.0)
MCHC: 32 g/dL (ref 30.0–36.0)
MCV: 95.2 fL (ref 80.0–100.0)
Platelets: 206 10*3/uL (ref 150–400)
RBC: 4.17 MIL/uL (ref 3.87–5.11)
RDW: 14.2 % (ref 11.5–15.5)
WBC: 11.3 10*3/uL — ABNORMAL HIGH (ref 4.0–10.5)
nRBC: 0 % (ref 0.0–0.2)

## 2019-06-14 LAB — PHOSPHORUS: Phosphorus: 3.2 mg/dL (ref 2.5–4.6)

## 2019-06-14 MED ORDER — DILTIAZEM HCL 60 MG PO TABS
120.0000 mg | ORAL_TABLET | Freq: Four times a day (QID) | ORAL | Status: DC
  Filled 2019-06-14: qty 2

## 2019-06-14 MED ORDER — POTASSIUM CHLORIDE 10 MEQ/100ML IV SOLN
10.0000 meq | INTRAVENOUS | Status: AC
  Administered 2019-06-14 (×4): 10 meq via INTRAVENOUS
  Filled 2019-06-14 (×4): qty 100

## 2019-06-14 MED ORDER — DILTIAZEM HCL 60 MG PO TABS: 90.0000 mg | ORAL_TABLET | Freq: Four times a day (QID) | ORAL | Status: DC

## 2019-06-14 MED ORDER — DILTIAZEM 12 MG/ML ORAL SUSPENSION
120.0000 mg | Freq: Four times a day (QID) | ORAL | Status: DC
  Administered 2019-06-14 – 2019-06-16 (×8): 120 mg via ORAL
  Filled 2019-06-14 (×11): qty 12

## 2019-06-14 NOTE — Progress Notes (Signed)
NAME:  Joann Ford, MRN:  [id number], DOB:  1945/06/13, LOS: 7 ADMISSION DATE:  06/07/2019, CONSULTATION DATE:  4/6 REFERRING MD: Karle Starch, CHIEF COMPLAINT:  Dyspnea   Brief History   74 yo female [facility] with malaise, confusion, hypoxia >> pt left AMA.  Brought to ER 4/07 with PEA cardiac arrest from pneumonia leading to respiratory failure.  Had ROSC in < 5 minutes.  Past Medical History  COPD, followed by Dr. Lamonte Sakai Chronic respiratory failure with hypoxemia, inconsistent use of O2 Restrictive lung disease due to scoliosis Hyperlipidemia Atrial fibrillation [facility] Events   4/06 Admission 4/07 Extubate 4/12 Bipap dependent  Consults:  Cardiology  Procedures:  ETT 4/6 > 4/7  Significant Diagnostic Tests:   CT head 4/6 > mild diffuse atrophy, calcified meningioma anterior superior left frontal lobe 1.9 x 1.3 cm without mass effect or edema.  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  Patchy periventricular small vessel disease.  CT ABD/Pelvis 4/8 >> 7.4 x2 cm fluid collection in SQ tissues of thel lateral hip adjacent to the greater trochanter of the left femur  CTA Chest 4/8 >> limited study due to respiratory motion, no large central PE, bibasilar patchy consolidative changes with air bronchograms, scattered clusters of ground-glass subpleural density primarily involving the LUL, small bilateral pleural effusions, non-displaced sternal fracture, probable bilateral rib fractures  Echo 4/9 >> EF 55 to 60%, mod LVH, mod PASP elevation, mod LA/RA dilation, mild MR, mild AS  Micro Data:  COVID 4/6 >> negative  RVP 4/6 >> negative  BCx2 4/6 >>   Antimicrobials:  Vanco 4/6 x1  Aztreonam 4/6 >> 4/7  Azithro 4/6 >> 4/7  Doxy 4/7 >>   Interim history/subjective:   Remains on BiPAP.  Has not tolerated coming off it for more than a few hrs  Objective   Blood pressure (!) 111/54, pulse (!) 123,  temperature 99.5 F (37.5 C), temperature source Bladder, resp. rate (!) 29, height 5\' 7"  (1.702 m), weight 66.5 kg, SpO2 99 %.    Vent Mode: BIPAP FiO2 (%):  [60 %] 60 % Set Rate:  [10 bmp] 10 bmp PEEP:  [6 cmH20] 6 cmH20   Intake/Output Summary (Last 24 hours) at 06/14/2019 0855 Last data filed at 06/14/2019 0610 Gross per 24 hour  Intake 405.93 ml  Output 2800 ml  Net -2394.07 ml   Filed Weights   06/12/19 0500 06/13/19 0402 06/14/19 0500  Weight: 68.3 kg 66.5 kg 66.5 kg    Examination: Gen:      Frail, elderly HEENT:  EOMI, sclera anicteric Neck:     No masses; no thyromegaly Lungs:    Clear to auscultation bilaterally; normal respiratory effort CV:         Regular rate and rhythm; no murmurs Abd:      + bowel sounds; soft, non-tender; no palpable masses, no distension Ext:    No edema; adequate peripheral perfusion Skin:      Warm and dry; no rash Neuro: Somnolent, arousable.  Labs significant for potassium 3.1, WBC 11.3, CXR 06/14/2019-worsening aeration with low lung volumes.   Resolved Hospital Problem list   Respiratory leading to PEA cardiac arrest, Elevated troponin from demand ischemia, Hypoglycemia  Assessment & Plan:   Acute on chronic hypoxic respiratory failure from CAP in setting of restrictive lung disease from kyphoscoliosis and COPD, and acute pulmonary edema. Continue Bipap Intermittent chest x-ray Continue antibiotics.  On day  7/7 of doxycycline Brovana, Pulmicort, as needed duo nebs Continue Lasix, prednisone.   A fib with RVR with hx of chronic a fib. Valvular heart disease. Continue amiodarone, Cardizem drip and Lopressor per cardiology Continue Eliquis, Crestor  Chronic Pain PRN morphine  Continue lexapro  Dysphagia. Severe malnutrition due to poor p.o. intake f/u with speech might need panda tube and tube feeds if she is unable to maintain caloric intake otherwise  Goals of care Has been BiPAP dependent for the past 4 days.   Discussed overall trajectory and concern that she is not doing well with poor prognosis for recovery. CODE STATUS is DNR.  She is not sure if she wants supplemental nutrition via feeding tube. Discussed comfort measures which she is considering but not ready to make the decision yet Husband and son were at bedside during these discussions on 4/12  Palliative care consult pending.  We will need to readdress goals of care today  Best practice:  Diet: npo DVT prophylaxis: eliquis GI prophylaxis: not indicated Mobility: PT/OT Code Status: DNR Disposition: ICU   Labs:   CMP Latest Ref Rng & Units 06/14/2019 06/13/2019 06/12/2019  Glucose 70 - 99 mg/dL 120(H) 93 100(H)  BUN 8 - 23 mg/dL 18 12 11   Creatinine 0.44 - 1.00 mg/dL 0.64 0.44 0.55  Sodium 135 - 145 mmol/L 139 137 134(L)  Potassium 3.5 - 5.1 mmol/L 3.1(L) 4.7 4.2  Chloride 98 - 111 mmol/L 94(L) 97(L) 98  CO2 22 - 32 mmol/L 32 26 28  Calcium 8.9 - 10.3 mg/dL 8.5(L) 8.6(L) 8.4(L)  Total Protein 6.5 - 8.1 g/dL - - -  Total Bilirubin 0.3 - 1.2 mg/dL - - -  Alkaline Phos 38 - 126 U/L - - -  AST 15 - 41 U/L - - -  ALT 0 - 44 U/L - - -    CBC Latest Ref Rng & Units 06/14/2019 06/13/2019 06/12/2019  WBC 4.0 - 10.5 K/uL 11.3(H) 13.4(H) 11.7(H)  Hemoglobin 12.0 - 15.0 g/dL 12.7 13.0 12.4  Hematocrit 36.0 - 46.0 % 39.7 40.9 38.2  Platelets 150 - 400 K/uL 206 140(L) 148(L)    ABG    Component Value Date/Time   PHART 7.445 06/08/2019 0417   PCO2ART 36.0 06/08/2019 0417   PO2ART 106 06/08/2019 0417   HCO3 24.3 06/08/2019 0417   ACIDBASEDEF 0.6 06/07/2019 1228   O2SAT 98.7 06/08/2019 0417    CBG (last 3)  Recent Labs    06/13/19 2341 06/14/19 0336 06/14/19 0746  GLUCAP 114* 122* 109*     Signature:   The patient is critically ill with multiple organ system failure and requires high complexity decision making for assessment and support, frequent evaluation and titration of therapies, advanced monitoring, review of  radiographic studies and interpretation of complex data.   Critical Care Time devoted to patient care services, exclusive of separately billable procedures, described in this note is 35 minutes.   Marshell Garfinkel MD [facility] Please see [email or web address] for pager details.  06/14/2019, 8:55 AM

## 2019-06-14 NOTE — Progress Notes (Signed)
[facility] ADULT ICU REPLACEMENT PROTOCOL FOR AM LAB REPLACEMENT ONLY  The patient does apply for the [facility] Adult ICU Electrolyte Replacment Protocol based on the criteria listed below:   1. Is GFR >/= 40 ml/min? Yes.    Patient's GFR today is >60 2. Is urine output >/= 0.5 ml/kg/hr for the last 6 hours? Yes.   Patient's UOP is 1 ml/kg/hr 3. Is BUN < 60 mg/dL? Yes.    Patient's BUN today is 18 4. Abnormal electrolyte(s): K-3.1 5. Ordered repletion with: per protocol 6. If a panic level lab has been reported, has the CCM MD in charge been notified? Yes.  .   Physician:  Dr. Terrill Mohr, Philis Nettle 06/14/2019 6:18 AM

## 2019-06-14 NOTE — Consult Note (Signed)
Consultation Note Date: 06/14/2019   Patient Name: Joann Ford  DOB: 17-Apr-1945  MRN: [id number]  Age / Sex: 74 y.o., female  PCP: Sheral Apley Referring Physician: Marshell Garfinkel, MD  Reason for Consultation: Establishing goals of care  HPI/Patient Profile: 74 y.o. female admitted on 06/07/2019.  74 yo female [facility] with malaise, confusion, hypoxia >> pt left AMA.  Brought to ER 4/07 with PEA cardiac arrest from pneumonia leading to respiratory failure.  Had ROSC in < 5 minutes. Clinical Assessment and Goals of Care:  Tamberlyn has COPD, she follows with Dr Lamonte Sakai, she has restrictive component due to her scoliosis, also with anxiety arthritis, HLD and A fib. She remains admitted on PCCM service, cardiology SLP and nutrition services are all following.   Palliative consult for goals of care has been requested.   At the time of my visit, [place] was off the BIPAP. I found her resting in bed, has marked scoliosis, was on supplemental O2, resting with head turned to one side. She opens her eyes, attempts to engage, but is only effectively able to answer yes/no type questions. Tried engaging with her about her current condition next steps, goals of care, issue of artificial nutrition through NGT etc. Eunice is simply not able to comprehend and participate.   Call placed and discussed with her husband Fritz Pickerel. I introduced myself and palliative care as follows: Palliative medicine is specialized medical care for people living with serious illness. It focuses on providing relief from the symptoms and stress of a serious illness. The goal is to improve quality of life for both the patient and the family.  Goals of care: Broad aims of medical therapy in relation to the patient's values and preferences. Our aim is to provide medical care aimed at enabling patients to achieve the goals  that matter most to them, given the circumstances of their particular medical situation and their constraints.   Mr Reule is thankful for the care the patient is receiving and for the information he has received. He states that the patient uses O2 [place] at home only on an as needed basis. He is a little surprised by her BIPAP dependency and ongoing decline in this hospitalization.   We discussed about irreversible nature of her illness. I described to him about her current CXR. Goals, wishes and values attempted to be explored. Overall, he doesn't believe that the patient would want artificial measures, he doesn't believe that tube feedings and continuous BIPAP would be in line with the patient's wishes, he will discuss further when their daughter arrives from [place] later today.   I gave him information about comfort measures approach, where the focus is aggressive symptom management and avoidance of suffering in the face of serious irreversible incurable illness. He verbalizes understanding of the situation, see below.   NEXT OF KIN  lives at home with husband. Has son and daughter.   SUMMARY OF RECOMMENDATIONS    Agree with DNR Continue current mode of  care, BIPAP breaks to allow for sips/bites as is already being done.  Ongoing conversations with husband regarding goals of care, initiation of tube feeds+continuation of BIPAP versus comfort feeds, comfort measures. He says that their daughter is driving in from [place] today, as a family they are discussing amongst themselves. Offered active listening and support, PMT to follow.  Thank you for the consult.   Code Status/Advance Care Planning:  DNR    Symptom Management:    as above.  Palliative Prophylaxis:   Delirium Protocol   Psycho-social/Spiritual:   Desire for further Chaplaincy support:yes  Additional Recommendations: Caregiving  Support/Resources  Prognosis:   Unable to determine  Discharge Planning: To Be  Determined      Primary Diagnoses: Present on Admission: . Cardiac arrest ([place])   I have reviewed the medical record, interviewed the patient and family, and examined the patient. The following aspects are pertinent.  Past Medical History:  Diagnosis Date  . Anxiety   . Arthritis   . Atrial fibrillation ([place])   . Chronic pain   . Hyperlipidemia   . Osteoporosis   . Scoliosis    Social History   Socioeconomic History  . Marital status: Married    Spouse name: Not on file  . Number of children: Not on file  . Years of education: Not on file  . Highest education level: Not on file  Occupational History  . Occupation: hair dresser  Tobacco Use  . Smoking status: Current Every Day Smoker    Packs/day: 0.50    Years: 40.00    Pack years: 20.00    Types: Cigarettes    Start date: 03/15/1965  . Smokeless tobacco: Never Used  Substance and Sexual Activity  . Alcohol use: No  . Drug use: No  . Sexual activity: Not on file  Other Topics Concern  . Not on file  Social History Narrative   Exercise--  walk   Social Determinants of Health   Financial Resource Strain:   . Difficulty of Paying Living Expenses:   Food Insecurity:   . Worried About Charity fundraiser in the Last Year:   . Arboriculturist in the Last Year:   Transportation Needs:   . Film/video editor (Medical):   Marland Kitchen Lack of Transportation (Non-Medical):   Physical Activity:   . Days of Exercise per Week:   . Minutes of Exercise per Session:   Stress:   . Feeling of Stress :   Social Connections:   . Frequency of Communication with Friends and Family:   . Frequency of Social Gatherings with Friends and Family:   . Attends Religious Services:   . Active Member of Clubs or Organizations:   . Attends Archivist Meetings:   Marland Kitchen Marital Status:    Family History  Problem Relation Age of Onset  . Cancer Father        Jaw Cancer  . Pneumonia Mother   . Cancer Cousin        jaw  . Colon  cancer Neg Hx   . Stomach cancer Neg Hx    Scheduled Meds: . apixaban  5 mg Oral BID  . arformoterol  15 mcg Nebulization BID  . budesonide (PULMICORT) nebulizer solution  0.5 mg Nebulization BID  . chlorhexidine  15 mL Mouth Rinse BID  . Chlorhexidine Gluconate Cloth  6 each Topical Daily  . diltiazem  120 mg Oral Q6H  . escitalopram  20 mg Oral Daily  .  furosemide  80 mg Intravenous BID  . mouth rinse  15 mL Mouth Rinse q12n4p  . methylPREDNISolone (SOLU-MEDROL) injection  40 mg Intravenous Daily  . metoprolol tartrate  10 mg Intravenous Q6H  . rosuvastatin  5 mg Oral QODAY   Continuous Infusions: . doxycycline (VIBRAMYCIN) IV Stopped (06/14/19 1403)   PRN Meds:.acetaminophen, ipratropium-albuterol, morphine injection, ondansetron (ZOFRAN) IV, Resource ThickenUp Clear, sodium chloride flush Medications Prior to Admission:  Prior to Admission medications   Medication Sig Start Date End Date Taking? Authorizing Provider  albuterol (VENTOLIN HFA) 108 (90 Base) MCG/ACT inhaler Inhale 2 puffs into the lungs every 6 (six) hours as needed for wheezing or shortness of breath.   Yes [provider]  apixaban (ELIQUIS) 5 MG TABS tablet Take 1 tablet (5 mg total) by mouth 2 (two) times daily. 01/07/19  Yes Hilty, Nadean Corwin, MD  diltiazem (CARDIZEM CD) 120 MG 24 hr capsule Take 120 mg by mouth daily.   Yes [provider]  escitalopram (LEXAPRO) 20 MG tablet Take 1 tablet (20 mg total) by mouth daily. 09/29/11  Yes Roma Schanz R, DO  furosemide (LASIX) 40 MG tablet TAKE 1 TABLET EVERY DAY Patient taking differently: Take 40 mg by mouth 2 (two) times daily.  03/08/19  Yes Hilty, Nadean Corwin, MD  gabapentin (NEURONTIN) 300 MG capsule Take 300-600 mg by mouth See admin instructions. Take 300 mg by mouth in the morning and 600 mg in the evening   Yes [provider]  metoprolol tartrate (LOPRESSOR) 25 MG tablet Take 25 mg by mouth 2 (two) times daily.   Yes [provider]  morphine (MS CONTIN) 15 MG 12 hr tablet Take 15 mg by mouth every 12 (twelve) hours.  04/16/18  Yes [provider]  rosuvastatin (CRESTOR) 5 MG tablet Take 5 mg by mouth every other day.    Yes [provider]  Tiotropium Bromide-Olodaterol (STIOLTO RESPIMAT) 2.5-2.5 MCG/ACT AERS Inhale 2 puffs into the lungs daily. 03/15/19  Yes Collene Gobble, MD   Allergies  Allergen Reactions  . Cefadroxil Anaphylaxis, Swelling and Other (See Comments)    Throat closes  . Ciprofloxacin Anaphylaxis and Other (See Comments)    Throat closes  . Penicillins Anaphylaxis and Swelling    Did it involve swelling of the face/tongue/throat, SOB, or low BP? Yes Did it involve sudden or severe rash/hives, skin peeling, or any reaction on the inside of your mouth or nose? No Did you need to seek medical attention at a hospital or doctor's office? Yes When did it last happen?"10-15 years ago" If all above answers are "NO", may proceed with cephalosporin use.   . Atorvastatin Other (See Comments)    Possible SAMS - 40 mg dose   Review of Systems +confusion  Physical Exam Frail appearing +scoliosis Regular work of breathing S1 S2 Monitor in room noted Abdomen not distended Dry lips No edema Awakens and verbalizes some but is not alert  Vital Signs: BP (!) 117/57   Pulse (!) 110   Temp 99.5 F (37.5 C) (Bladder)   Resp 19   Ht 5\' 7"  (1.702 m)   Wt 66.5 kg   SpO2 97%   BMI 22.96 kg/m  Pain Scale: 0-10 POSS *See Group Information*: 1-Acceptable,Awake and alert Pain Score: Asleep   SpO2: SpO2: 97 % O2 Device:SpO2: 97 % O2 Flow Rate: .O2 Flow Rate (L/min): 15 L/min  IO: Intake/output summary:   Intake/Output Summary (Last 24 hours) at 06/14/2019 1526  Last data filed at 06/14/2019 0610 Gross per 24 hour  Intake 299.41 ml  Output 2800 ml  Net -2500.59 ml    LBM: Last BM Date: 06/11/19 Baseline Weight: Weight: 67 kg Most recent weight: Weight: 66.5 kg       Palliative Assessment/Data:   PPS 30%  Time In:  1430 Time Out:  1530 Time Total:  60  Greater than 50%  of this time was spent counseling and coordinating care related to the above assessment and plan.  Signed by: Loistine Chance, MD   Please contact Palliative Medicine Team phone at [phone] for questions and concerns.  For individual provider: See Shea Evans

## 2019-06-14 NOTE — Progress Notes (Signed)
Per Mannam MD, Okay to have third visitor- Berdie Ogren. Patient is in chart.

## 2019-06-14 NOTE — Progress Notes (Signed)
Progress Note  Patient Name: Joann Ford Date of Encounter: 06/14/2019  Primary Cardiologist: Pixie Casino, MD   Subjective   Patient put out 2.8L overnight. She remains on Bipap. Palliative care following. Patient remains in aifb with rate around 120. She is accompanied by husband and son this morning. She is not wanting to wake up during my interview but nurse reports has been responsive.   Inpatient Medications    Scheduled Meds: . apixaban  5 mg Oral BID  . arformoterol  15 mcg Nebulization BID  . budesonide (PULMICORT) nebulizer solution  0.5 mg Nebulization BID  . chlorhexidine  15 mL Mouth Rinse BID  . Chlorhexidine Gluconate Cloth  6 each Topical Daily  . diltiazem  60 mg Oral Q6H  . escitalopram  20 mg Oral Daily  . furosemide  80 mg Intravenous BID  . mouth rinse  15 mL Mouth Rinse q12n4p  . methylPREDNISolone (SOLU-MEDROL) injection  40 mg Intravenous Daily  . metoprolol tartrate  10 mg Intravenous Q6H  . rosuvastatin  5 mg Oral QODAY   Continuous Infusions: . doxycycline (VIBRAMYCIN) IV Stopped (06/13/19 2331)  . potassium chloride 10 mEq (06/14/19 0842)   PRN Meds: acetaminophen, ipratropium-albuterol, morphine injection, ondansetron (ZOFRAN) IV, Resource ThickenUp Clear, sodium chloride flush   Vital Signs    Vitals:   06/14/19 0700 06/14/19 0823 06/14/19 0824 06/14/19 0831  BP: (!) 111/54     Pulse: (!) 123     Resp: (!) 29     Temp:      TempSrc:      SpO2: 91% 94% 97% 99%  Weight:      Height:        Intake/Output Summary (Last 24 hours) at 06/14/2019 0903 Last data filed at 06/14/2019 0610 Gross per 24 hour  Intake 405.93 ml  Output 2800 ml  Net -2394.07 ml   Last 3 Weights 06/14/2019 06/13/2019 06/12/2019  Weight (lbs) 146 lb 9.7 oz 146 lb 9.7 oz 150 lb 9.2 oz  Weight (kg) 66.5 kg 66.5 kg 68.3 kg      Telemetry    Afib, rates 120s, PVCs - Personally Reviewed  ECG    No new - Personally Reviewed  Physical Exam   GEN: No  acute distress; lethargic Neck: No JVD Cardiac: RRR, no murmurs, rubs, or gallops.  Respiratory: diminished at bases, wheezing; Bipap GI: Soft, nontender, non-distended  MS: No edema; No deformity. Neuro:  Nonfocal  Psych: Normal affect   Labs    High Sensitivity Troponin:   Recent Labs  Lab 06/07/19 1317  TROPONINIHS 9      Chemistry Recent Labs  Lab 06/09/19 0246 06/09/19 0844 06/09/19 1336 06/09/19 1336 06/10/19 0304 06/10/19 0304 06/12/19 0307 06/13/19 0257 06/14/19 0240  NA 143   < > 139   < > 138   < > 134* 137 139  K 3.3*   < > 3.0*   < > 3.3*   < > 4.2 4.7 3.1*  CL 105   < > 100   < > 98   < > 98 97* 94*  CO2 28   < > 32   < > 30   < > 28 26 32  GLUCOSE 125*   < > 131*   < > 129*   < > 100* 93 120*  BUN 15   < > 10   < > 8   < > 11 12 18   CREATININE 0.54   < >  0.56   < > 0.52   < > 0.55 0.44 0.64  CALCIUM 8.3*   < > 8.5*   < > 8.7*   < > 8.4* 8.6* 8.5*  PROT 6.2*  --  6.6  --  6.5  --   --   --   --   ALBUMIN 2.9*  --  3.1*  --  3.1*  --   --   --   --   AST 47*  --  38  --  31  --   --   --   --   ALT 46*  --  44  --  38  --   --   --   --   ALKPHOS 61  --  68  --  63  --   --   --   --   BILITOT 1.0  --  0.9  --  1.3*  --   --   --   --   GFRNONAA >60   < > >60   < > >60   < > >60 >60 >60  GFRAA >60   < > >60   < > >60   < > >60 >60 >60  ANIONGAP 10   < > 7   < > 10   < > 8 14 13    < > = values in this interval not displayed.     Hematology Recent Labs  Lab 06/12/19 0307 06/13/19 0257 06/14/19 0240  WBC 11.7* 13.4* 11.3*  RBC 3.95 4.30 4.17  HGB 12.4 13.0 12.7  HCT 38.2 40.9 39.7  MCV 96.7 95.1 95.2  MCH 31.4 30.2 30.5  MCHC 32.5 31.8 32.0  RDW 14.3 14.4 14.2  PLT 148* 140* 206    BNP Recent Labs  Lab 06/07/19 1317  BNP 1,296.5*     DDimer  Recent Labs  Lab 06/07/19 1920  DDIMER 3.74*     Radiology    DG Chest Port 1 View  Result Date: 06/14/2019 CLINICAL DATA:  74 year old female with history of respiratory failure. EXAM:  PORTABLE CHEST 1 VIEW COMPARISON:  Chest x-ray 06/12/2019. FINDINGS: Lung volumes are low and have significantly decreased compared to the prior study. Widespread ill-defined opacities and areas of interstitial prominence now noted throughout all aspects of the lungs bilaterally. Bibasilar opacities are presumably predominantly atelectasis. Small bilateral pleural effusions (left greater than right). Cardiac silhouette is obscured. Mediastinal contours are distorted by patient positioning. Aortic atherosclerosis. IMPRESSION: 1. Markedly worsened aeration in the lungs, as above. Given the low lung volumes, interpretation is severely limited, but this could simply reflect hypoventilatory lungs in the setting of congestive heart failure. However, clinical correlation for signs and symptoms of multilobar pneumonia is suggested. Electronically Signed   By: Vinnie Langton M.D.   On: 06/14/2019 06:48    Cardiac Studies   2D echo 06/10/2019 IMPRESSIONS   1. Left ventricular ejection fraction, by estimation, is 55 to 60%. The  left ventricle has normal function. The left ventricle has no regional  wall motion abnormalities. There is moderate concentric left ventricular  hypertrophy. Left ventricular  diastolic function could not be evaluated.  2. Right ventricular systolic function is normal. The right ventricular  size is normal. There is moderately elevated pulmonary artery systolic  pressure.  3. Left atrial size was moderately dilated.  4. Right atrial size was moderately dilated.  5. The mitral valve is normal in structure. Mild mitral valve  regurgitation. No evidence of  mitral stenosis.  6. Tricuspid valve regurgitation is mild to moderate.  7. The aortic valve is tricuspid. Aortic valve regurgitation is mild to  moderate. Mild aortic valve stenosis.  8. The inferior vena cava is dilated in size with >50% respiratory  variability, suggesting right atrial pressure of 8 mmHg.     Patient Profile     74 y.o. female with a hx of COPD with O2 use athome, restrictive lung disease due to scoliosis,mild aortic stenosis, coronary arterycalcification seen on CT 01/2019,hyperlipidemia, atrial fibrillations/p failed cardioversion x 3 on Eliquis,anxiety, arthritis, marked thoracic kyphosiswho is being seen for the evaluation of cardiac arrestat the request of Dr. Lake Bells.  Assessment & Plan   Cardiac arrest - suspect PEA arrestfrom respiratory arrest from CAP - tele showedAfib RVR, however this is chronic and known for the patient -hsTroponin 9. EKG showedAfib RVR and is nonischemic. Patient has low risk Myoview study in 2019. Patient does report intermittent exertional chest pain.  -BNP 1,296. >>2D echo with normal LVF. On IV lasix - IV abx for sepsis from CAP.   Goals of care - palliative care following - patient is DNR - likely transition to comfort care  CAP/Restrictive Lung disease/COPD - IV abx per IM - Respiratory status has been worsening and patient remains on Bipap   Afib RVR/chronic Afib s/p failed cardioversion x 3 - Started on IV dilt and IV amiodarone  - IV dilt transitioned to 19m gQ6 and amiodarone discontinued  - Patient remains in Afib, heart rates steady around  120s - will increase diltiazem -K+ 3.1 >>K+ goal >4, Mag goal >2 - continue Eliquis  Sepsis - suspected from CAP - COVID negative. Respiratory panel negative - Blood culture with no growth - WBC 11.3 - procal wnl - abx per IM  Acute pulmonary edema - no mention of edema on chest CT but small bilateral effusions present.  -Cxray today with worsening aeration, CHF vs multilobar PNA - Lasix 80mg  IV BID - patient put out 2.8L urine overnight. Weight down 1lb  Mild Aortic Stenosis - noted on echo along with mild to moderate AR  Hypokalemia -K+ 3.1>>supplement    For questions or updates, please contact [place] Please consult [email or web address] for  contact info under        Signed, Wanette Robison Ninfa Meeker, PA-C  06/14/2019, 9:03 AM

## 2019-06-14 NOTE — Progress Notes (Signed)
PCCM progress note  Discussed with husband and son.  Patient has been BiPAP dependent with worsening chest x-ray, respiratory status Recommended transitioning to comfort care and family agrees We will continue current therapies for now until her daughter can arrive from [place] when we will transition to morphine and take off BiPAP  Family is aware that she may deteriorate further and pass away in the meantime.  Marshell Garfinkel MD [facility] Please see [email or web address] for pager details.  06/14/2019, 3:29 PM

## 2019-06-14 NOTE — Progress Notes (Signed)
Nutrition Follow-up  DOCUMENTATION CODES:   Not applicable  INTERVENTION:  - will monitor for medical course and any nutrition-related needs moving forward.    NUTRITION DIAGNOSIS:   Inadequate oral intake related to inability to eat as evidenced by NPO status. -ordered Dysphagia 1, honey-thick liquids but unable to come off BiPAP  GOAL:   Patient will meet greater than or equal to 90% of their needs -unmet  MONITOR:   Labs, Weight trends  ASSESSMENT:   74 year old female with past medical history of COPD, scoliosis, chronic respiratory failure with hypoxemia, restrictive lung disease due to scoliosis, HLD, atrial fibrillation, presented to [facility] ED yesterday with non-specific complaints including malaise, falls, hallucinations, and noted to be hypoxemic and recommended admission but pt left AMA. Patient brought to [facility] ED via EMS and on arrival was noted to be pulseless, underwent standard resuscitation via ACLS protocol for <5 minutes, was intubated and regained a pulse.  Weight has been mainly stable since admission on 4/7. Diet advanced from NPO to Dysphagia 1, honey-thick liquids on 4/9 at 1400 but no intakes documented since that time. Notes indicate that patient has been on BiPAP, unable to come off, x4 days. Patient currently on BiPAP and resting in bed with no family/visitors present.   Able to talk with RN at bedside. She reports likely plan is to transition to comfort care in the near future.   Per notes: - restrictive lung disease d/t kyphoscoliosis, COPD, acute pulmonary edema - dysphagia and possible need for small bore NGT and TF?--dependent on decision concerning GOC today   Labs reviewed; CBGs: 122 and 109 mg/dl, K: 3.1 mmol/l, Cl: 94 mmol/l, Ca: 8.5 mg/dl. Medications reviewed; 80 mg IV lasix BID, 40 mg solu-medrol/day, 10 mEq IV KCl x4 runs 4/13.   Diet Order:   Diet Order            DIET - DYS 1 Room service appropriate? Yes; Fluid  consistency: Honey Thick  Diet effective now              EDUCATION NEEDS:   No education needs have been identified at this time  Skin:  Skin Assessment: Skin Integrity Issues: Skin Integrity Issues:: Other (Comment) Other: MASD; abdomen  Last BM:  4/10  Height:   Ht Readings from Last 1 Encounters:  06/08/19 5\' 7"  (1.702 m)    Weight:   Wt Readings from Last 1 Encounters:  06/14/19 66.5 kg    Ideal Body Weight:  61.4 kg  BMI:  Body mass index is 22.96 kg/m.  Estimated Nutritional Needs:   Kcal:  [id number] kcal  Protein:  80-95 grams  Fluid:  >/= 1.5 L/day     Jarome Matin, MS, RD, LDN, CNSC Inpatient Clinical Dietitian RD pager # available in AMION  After hours/weekend pager # available in [facility]

## 2019-06-15 ENCOUNTER — Inpatient Hospital Stay (HOSPITAL_COMMUNITY): Payer: Medicare HMO

## 2019-06-15 DIAGNOSIS — R531 Weakness: Secondary | ICD-10-CM | POA: Diagnosis not present

## 2019-06-15 DIAGNOSIS — J9601 Acute respiratory failure with hypoxia: Secondary | ICD-10-CM | POA: Diagnosis not present

## 2019-06-15 DIAGNOSIS — Z515 Encounter for palliative care: Secondary | ICD-10-CM | POA: Diagnosis not present

## 2019-06-15 DIAGNOSIS — R0602 Shortness of breath: Secondary | ICD-10-CM | POA: Diagnosis not present

## 2019-06-15 DIAGNOSIS — Z7189 Other specified counseling: Secondary | ICD-10-CM | POA: Diagnosis not present

## 2019-06-15 LAB — PHOSPHORUS: Phosphorus: 3.5 mg/dL (ref 2.5–4.6)

## 2019-06-15 LAB — GLUCOSE, CAPILLARY
Glucose-Capillary: 106 mg/dL — ABNORMAL HIGH (ref 70–99)
Glucose-Capillary: 117 mg/dL — ABNORMAL HIGH (ref 70–99)
Glucose-Capillary: 121 mg/dL — ABNORMAL HIGH (ref 70–99)
Glucose-Capillary: 149 mg/dL — ABNORMAL HIGH (ref 70–99)
Glucose-Capillary: 151 mg/dL — ABNORMAL HIGH (ref 70–99)
Glucose-Capillary: 152 mg/dL — ABNORMAL HIGH (ref 70–99)
Glucose-Capillary: 175 mg/dL — ABNORMAL HIGH (ref 70–99)

## 2019-06-15 LAB — BASIC METABOLIC PANEL
Anion gap: 11 (ref 5–15)
BUN: 23 mg/dL (ref 8–23)
CO2: 34 mmol/L — ABNORMAL HIGH (ref 22–32)
Calcium: 8.4 mg/dL — ABNORMAL LOW (ref 8.9–10.3)
Chloride: 96 mmol/L — ABNORMAL LOW (ref 98–111)
Creatinine, Ser: 0.51 mg/dL (ref 0.44–1.00)
GFR calc Af Amer: >60 mL/min (ref >60)
GFR calc non Af Amer: >60 mL/min (ref >60)
Glucose, Bld: 107 mg/dL — ABNORMAL HIGH (ref 70–99)
Potassium: 3.5 mmol/L (ref 3.5–5.1)
Sodium: 141 mmol/L (ref 135–145)

## 2019-06-15 LAB — CBC
HCT: 38.2 % (ref 36.0–46.0)
Hemoglobin: 12.1 g/dL (ref 12.0–15.0)
MCH: 30.6 pg (ref 26.0–34.0)
MCHC: 31.7 g/dL (ref 30.0–36.0)
MCV: 96.5 fL (ref 80.0–100.0)
Platelets: 221 10*3/uL (ref 150–400)
RBC: 3.96 MIL/uL (ref 3.87–5.11)
RDW: 14 % (ref 11.5–15.5)
WBC: 8.8 10*3/uL (ref 4.0–10.5)
nRBC: 0 % (ref 0.0–0.2)

## 2019-06-15 LAB — MAGNESIUM: Magnesium: 2 mg/dL (ref 1.7–2.4)

## 2019-06-15 NOTE — Progress Notes (Signed)
Occupational Therapy Discharge Patient Details Name: Joann Ford MRN: [id number] DOB: 02-04-1946 Today's Date: 06/15/2019 Time:  -     Patient discharged from OT services secondary to medical decline - will need to re-order OT to resume therapy services.  Please see latest therapy progress note for current level of functioning and progress toward goals.    Progress and discharge plan discussed with patient and/or caregiver: Patient unable to participate in discharge planning, per chart family met with palliative care, now transitioning to hospice services.  Lou Cal, OT Acute Rehabilitation Services Pager [phone] Office [place] 06/15/2019, 3:44 PM

## 2019-06-15 NOTE — Progress Notes (Signed)
eLink Physician-Brief Progress Note Patient Name: Joann Ford DOB: 1945/05/30 MRN: [id number]   Date of Service  06/15/2019  HPI/Events of Note  BiPAP ordered Q HS and PRN. Request to D/C Q HS order.  eICU Interventions  Will D/C BiPAP Q HS.      Intervention Category Major Interventions: Other:  Lysle Dingwall 06/15/2019, 9:45 PM

## 2019-06-15 NOTE — Progress Notes (Signed)
Physical Therapy Discharge Patient Details Name: Joann Ford MRN: [id number] DOB: 01/17/1946 Today's Date: 06/15/2019 Time:  -     Patient discharged from PT services secondary to medical decline - will need to re-order PT to resume therapy services.  Please see latest therapy progress note for current level of functioning and progress toward goals.    Progress and discharge plan discussed with patient and/or caregiver: Patient unable to participate in discharge planning and no caregivers available  Family meeting with palliative care.  Pt declining medically and plan now is for hospice.      Yashas Camilli,KATHrine E 06/15/2019, 11:02 AM   Arlyce Dice, DPT Acute Rehabilitation Services Office: [phone]

## 2019-06-15 NOTE — Progress Notes (Signed)
NAME:  Joann Ford, MRN:  [id number], DOB:  15-Nov-1945, LOS: 8 ADMISSION DATE:  06/07/2019, CONSULTATION DATE:  4/6 REFERRING MD: Karle Starch, CHIEF COMPLAINT:  Dyspnea   Brief History   74 yo female presented to [facility]06 with malaise, confusion, hypoxia >> pt left AMA.  Brought to ER 4/07 with PEA cardiac arrest from pneumonia leading to respiratory failure.  Had ROSC in < 5 minutes.  Past Medical History  COPD, followed by Dr. Lamonte Sakai Chronic respiratory failure with hypoxemia, inconsistent use of O2 Restrictive lung disease due to scoliosis Hyperlipidemia Atrial fibrillation [facility] Events   4/06 Admission 4/07 Extubate 4/12 Bipap dependent 4/14 DNR, transitioning to hospice. Transfer out of ICU  Consults:  Cardiology  Procedures:  ETT 4/6 > 4/7  Significant Diagnostic Tests:   CT head 4/6 > mild diffuse atrophy, calcified meningioma anterior superior left frontal lobe 1.9 x 1.3 cm without mass effect or edema.  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  Patchy periventricular small vessel disease.  CT ABD/Pelvis 4/8 >> 7.4 x2 cm fluid collection in SQ tissues of thel lateral hip adjacent to the greater trochanter of the left femur  CTA Chest 4/8 >> limited study due to respiratory motion, no large central PE, bibasilar patchy consolidative changes with air bronchograms, scattered clusters of ground-glass subpleural density primarily involving the LUL, small bilateral pleural effusions, non-displaced sternal fracture, probable bilateral rib fractures  Echo 4/9 >> EF 55 to 60%, mod LVH, mod PASP elevation, mod LA/RA dilation, mild MR, mild AS  Micro Data:  COVID 4/6 >> negative  RVP 4/6 >> negative  BCx2 4/6 >>   Antimicrobials:  Vanco 4/6 x1  Aztreonam 4/6 >> 4/7  Azithro 4/6 >> 4/7  Doxy 4/7 >>   Interim history/subjective:   Off bipap today. More alert.  Objective   Blood pressure (!) 112/46, pulse 77,  temperature 98.6 F (37 C), temperature source Bladder, resp. rate 18, height 5\' 7"  (1.702 m), weight 68.4 kg, SpO2 99 %.    Vent Mode: BIPAP;PCV;Spontaneous FiO2 (%):  [50 %-60 %] 50 % Set Rate:  [10 bmp] 10 bmp PEEP:  [6 cmH20] 6 cmH20   Intake/Output Summary (Last 24 hours) at 06/15/2019 1006 Last data filed at 06/15/2019 0400 Gross per 24 hour  Intake 443.48 ml  Output 750 ml  Net -306.52 ml   Filed Weights   06/14/19 0500 06/15/19 0349 06/15/19 0350  Weight: 66.5 kg 69.3 kg 68.4 kg    Examination: Gen:      Frail, elderly HEENT:  EOMI, sclera anicteric Neck:     No masses; no thyromegaly Lungs:    Clear to auscultation bilaterally; normal respiratory effort CV:         Regular rate and rhythm; no murmurs Abd:      + bowel sounds; soft, non-tender; no palpable masses, no distension Ext:    No edema; adequate peripheral perfusion Skin:      Warm and dry; no rash Neuro: Awake, responsive  Labs reviewed which are stable Chest x-ray shows improving CHF with small bilateral effusions.  Resolved Hospital Problem list   Respiratory leading to PEA cardiac arrest, Elevated troponin from demand ischemia, Hypoglycemia  Assessment & Plan:   Acute on chronic hypoxic respiratory failure from CAP in setting of restrictive lung disease from kyphoscoliosis and COPD, and acute pulmonary edema. Continue Bipap as needed Intermittent chest x-ray Finished 7 days of doxycycline Brovana, Pulmicort,  as needed duo nebs Continue Lasix, prednisone.   A fib with RVR with hx of chronic a fib. Valvular heart disease. Continue amiodarone, Cardizem drip and Lopressor per cardiology Continue Eliquis, Crestor  Chronic Pain PRN morphine  Continue lexapro  Dysphagia. Severe malnutrition due to poor p.o. intake P.o. diet  Goals of care Discussed with daughter who has arrived from [place].  Had previous discussions with husband, son Patient is awake off BiPAP and is able to participate in  discussions.  She wants to be comfortable and is agreed for hospice.  Will likely need residential hospice given nursing needs. Discussed with palliative care and case management consult placed  Best practice:  Diet: NPO DVT prophylaxis: eliquis GI prophylaxis: not indicated Mobility: PT/OT Code Status: DNR Disposition: Stable for transfer out of ICU and to [facility] service.  Labs:   CMP Latest Ref Rng & Units 06/15/2019 06/14/2019 06/14/2019  Glucose 70 - 99 mg/dL 107(H) 105(H) 120(H)  BUN 8 - 23 mg/dL 23 21 18   Creatinine 0.44 - 1.00 mg/dL 0.51 0.59 0.64  Sodium 135 - 145 mmol/L 141 140 139  Potassium 3.5 - 5.1 mmol/L 3.5 3.6 3.1(L)  Chloride 98 - 111 mmol/L 96(L) 95(L) 94(L)  CO2 22 - 32 mmol/L 34(H) 35(H) 32  Calcium 8.9 - 10.3 mg/dL 8.4(L) 8.4(L) 8.5(L)  Total Protein 6.5 - 8.1 g/dL - - -  Total Bilirubin 0.3 - 1.2 mg/dL - - -  Alkaline Phos 38 - 126 U/L - - -  AST 15 - 41 U/L - - -  ALT 0 - 44 U/L - - -    CBC Latest Ref Rng & Units 06/15/2019 06/14/2019 06/13/2019  WBC 4.0 - 10.5 K/uL 8.8 11.3(H) 13.4(H)  Hemoglobin 12.0 - 15.0 g/dL 12.1 12.7 13.0  Hematocrit 36.0 - 46.0 % 38.2 39.7 40.9  Platelets 150 - 400 K/uL 221 206 140(L)    ABG    Component Value Date/Time   PHART 7.445 06/08/2019 0417   PCO2ART 36.0 06/08/2019 0417   PO2ART 106 06/08/2019 0417   HCO3 24.3 06/08/2019 0417   ACIDBASEDEF 0.6 06/07/2019 1228   O2SAT 98.7 06/08/2019 0417    CBG (last 3)  Recent Labs    06/14/19 2349 06/15/19 0356 06/15/19 0751  GLUCAP 121* 106* 152*     Signature:   Magalene Mclear MD [facility] Please see [email or web address] for pager details.  06/15/2019, 10:06 AM

## 2019-06-15 NOTE — Progress Notes (Addendum)
Daily Progress Note   Patient Name: Joann Ford       Date: 06/15/2019 DOB: Jul 17, 1945  Age: 74 y.o. MRN#: [id number] Attending Physician: Marshell Garfinkel, MD Primary Care Physician: Ardith Dark, PA-C Admit Date: 06/07/2019  Reason for Consultation/Follow-up: Establishing goals of care  Subjective:  off BIPAP, on 7L O2, awakens some. Family left by the time I arrived on the unit. Doesn't appear to be in resp distress currently.   Length of Stay: 8  Current Medications: Scheduled Meds:  . apixaban  5 mg Oral BID  . arformoterol  15 mcg Nebulization BID  . budesonide (PULMICORT) nebulizer solution  0.5 mg Nebulization BID  . chlorhexidine  15 mL Mouth Rinse BID  . Chlorhexidine Gluconate Cloth  6 each Topical Daily  . diltiazem  120 mg Oral Q6H  . escitalopram  20 mg Oral Daily  . furosemide  80 mg Intravenous BID  . mouth rinse  15 mL Mouth Rinse q12n4p  . methylPREDNISolone (SOLU-MEDROL) injection  40 mg Intravenous Daily  . metoprolol tartrate  10 mg Intravenous Q6H  . rosuvastatin  5 mg Oral QODAY    Continuous Infusions:   PRN Meds: acetaminophen, ipratropium-albuterol, morphine injection, ondansetron (ZOFRAN) IV, Resource ThickenUp Clear, sodium chloride flush  Physical Exam         Frail elderly Scoliosis Shallow regular breath sounds S1 S2 No edema Abdomen not distended Awakens and interacts.   Vital Signs: BP (!) 112/46   Pulse 77   Temp 98.6 F (37 C) (Bladder)   Resp 18   Ht 5\' 7"  (1.702 m)   Wt 68.4 kg   SpO2 99%   BMI 23.62 kg/m  SpO2: SpO2: 99 % O2 Device: O2 Device: High Flow Nasal Cannula O2 Flow Rate: O2 Flow Rate (L/min): 10 L/min  Intake/output summary:   Intake/Output Summary (Last 24 hours) at 06/15/2019 1252 Last data filed at  06/15/2019 0400 Gross per 24 hour  Intake 443.48 ml  Output 750 ml  Net -306.52 ml   LBM: Last BM Date: 06/14/19 Baseline Weight: Weight: 67 kg Most recent weight: Weight: 68.4 kg       Palliative Assessment/Data:      Patient Active Problem List   Diagnosis Date Noted  . Acute respiratory failure with hypoxia ([place])   . Hypoxia   .  Elevated d-dimer   . Chest pain of uncertain etiology   . Atrial fibrillation with rapid ventricular response ([place])   . Cardiac arrest ([place]) 06/07/2019  . Pressure injury of skin 01/22/2019  . Chronic respiratory failure with hypoxia ([place]) 01/21/2019  . Multiple closed fractures of ribs of left side   . Atrial fibrillation with RVR ([place]) 02/05/2018  . Longstanding persistent atrial fibrillation ([place]) 02/05/2018  . Major depressive disorder with single episode, in partial remission ([place]) 10/28/2017  . Dyslipidemia 07/22/2017  . Aortic valve stenosis 07/22/2017  . Pre-operative cardiovascular examination 06/08/2017  . Murmur 06/08/2017  . Other emphysema ([place]) 07/09/2016  . Right hip pain 09/14/2013  . Chronic pain 08/20/2013  . Depression 08/20/2013  . Neuropathy 08/20/2013  . HYPOKALEMIA 12/18/2008  . DYSPEPSIA 12/18/2008  . Postmenopausal status 12/18/2008  . SINUSITIS - ACUTE-NOS 11/21/2008  . COPD (chronic obstructive pulmonary disease) ([place]) 11/21/2008  . CERUMEN IMPACTION, BILATERAL 09/19/2008  . ELEVATED BP READING WITHOUT DX HYPERTENSION 03/06/2008  . HYPERTENSION 02/07/2008  . EDEMA 10/14/2007  . DOE (dyspnea on exertion) 10/14/2007  . Spinal stenosis of lumbar region 09/28/2007  . Personal history of tobacco use, presenting hazards to health 11/23/2006  . B12 deficiency 08/17/2006  . Hyperlipidemia 08/17/2006  . Anxiety state 08/17/2006  . Other idiopathic scoliosis, thoracolumbar region 08/17/2006  . OBESITY NOS 04/01/2006  . PANIC ATTACK 04/01/2006    Palliative Care Assessment & Plan   Patient Profile:     Assessment:    Recommendations/Plan:   singular focus to maximize comfort, support patient and family, provide symptom relief, supplemental O2, PO as much as she can tolerate.   Recommend residential hospice for aggressive symptom management at end of life, discussed with husband in detail at time of initial consultation. Ok to proceed with transitions of care consult, residential hospice arrangements.   Prognosis likely 2 weeks. Ok to be transferred out of stepdown unit in my opinion.   Goals of Care and Additional Recommendations:  Limitations on Scope of Treatment: Full Comfort Care  Code Status:    Code Status Orders  (From admission, onward)         Start     Ordered   06/08/19 1039  Do not attempt resuscitation (DNR)  Continuous    Question Answer Comment  Maintain current active treatments Yes   Do not initiate new interventions Yes      06/08/19 1038        Code Status History    Date Active Date Inactive Code Status Order ID Comments User Context   06/07/2019 1748 06/08/2019 1038 Full Code [id number]  Juanito Doom, MD Inpatient   01/21/2019 1025 01/23/2019 1918 DNR [id number]  Ina Homes, MD ED   01/21/2019 1003 01/21/2019 1025 DNR [id number]  Ina Homes, MD ED   02/05/2018 1725 02/10/2018 1426 Full Code [id number]  Elgergawy, Silver Huguenin, MD Inpatient   Advance Care Planning Activity       Prognosis:   < 2 weeks  Discharge Planning:  Hospice facility  Care plan was discussed with  IDT  Thank you for allowing the Palliative Medicine Team to assist in the care of this patient.   Time In: 12 Time Out: 12.25 Total Time 25 Prolonged Time Billed  no       Greater than 50%  of this time was spent counseling and coordinating care related to the above assessment and plan.  Loistine Chance, MD  Please contact Palliative Medicine Team phone at [phone]  for questions and concerns.

## 2019-06-15 NOTE — Progress Notes (Signed)
Speech Language Pathology Discharge Patient Details Name: Joann Ford MRN: [id number] DOB: 1945-07-12 Today's Date: 06/15/2019 Time:  -     Patient discharged from SLP services secondary to medical decline - will need to re-order SLP to resume therapy services.   Please see latest therapy progress note for current level of functioning and progress toward goals.    Progress and discharge plan discussed with patient and/or caregiver: Patient unable to participate in discharge planning and no caregivers available  Family is meeting with Palliative Care. Pt is declining medically, and is currently transitioning to [facility] care  [place] B. Quentin Ore, [facility], [place] Speech Language Pathologist Office: [phone] Pager: [phone]  Shonna Chock  06/15/2019, 11:50 AM

## 2019-06-16 DIAGNOSIS — R079 Chest pain, unspecified: Secondary | ICD-10-CM | POA: Diagnosis not present

## 2019-06-16 DIAGNOSIS — Z7189 Other specified counseling: Secondary | ICD-10-CM | POA: Diagnosis not present

## 2019-06-16 DIAGNOSIS — Z515 Encounter for palliative care: Secondary | ICD-10-CM | POA: Diagnosis not present

## 2019-06-16 DIAGNOSIS — I4891 Unspecified atrial fibrillation: Secondary | ICD-10-CM | POA: Diagnosis not present

## 2019-06-16 DIAGNOSIS — I469 Cardiac arrest, cause unspecified: Secondary | ICD-10-CM | POA: Diagnosis not present

## 2019-06-16 DIAGNOSIS — R531 Weakness: Secondary | ICD-10-CM | POA: Diagnosis not present

## 2019-06-16 DIAGNOSIS — J9601 Acute respiratory failure with hypoxia: Secondary | ICD-10-CM | POA: Diagnosis not present

## 2019-06-16 DIAGNOSIS — R0602 Shortness of breath: Secondary | ICD-10-CM | POA: Diagnosis not present

## 2019-06-16 LAB — GLUCOSE, CAPILLARY
Glucose-Capillary: 108 mg/dL — ABNORMAL HIGH (ref 70–99)
Glucose-Capillary: 99 mg/dL (ref 70–99)
Glucose-Capillary: 109 mg/dL — ABNORMAL HIGH (ref 70–99)
Glucose-Capillary: 173 mg/dL — ABNORMAL HIGH (ref 70–99)

## 2019-06-16 MED ORDER — FUROSEMIDE 20 MG PO TABS: 60.0000 mg | ORAL_TABLET | Freq: Two times a day (BID) | ORAL | 0 refills | Status: AC

## 2019-06-16 MED ORDER — DILTIAZEM 12 MG/ML ORAL SUSPENSION: 120.0000 mg | Freq: Four times a day (QID) | ORAL | 0 refills | Status: AC

## 2019-06-16 MED ORDER — PREDNISONE 20 MG PO TABS: ORAL_TABLET | ORAL | 0 refills | Status: AC

## 2019-06-16 MED ORDER — PREDNISONE 20 MG PO TABS
20.0000 mg | ORAL_TABLET | Freq: Every day | ORAL | Status: DC
  Administered 2019-06-16: 11:00:00 20 mg via ORAL
  Filled 2019-06-16: qty 1

## 2019-06-16 NOTE — Progress Notes (Signed)
Daily Progress Note   Patient Name: Joann Ford       Date: 06/16/2019 DOB: 1945/07/09  Age: 74 y.o. MRN#: [id number] Attending Physician: Tawni Millers Primary Care Physician: Sheral Apley Admit Date: 06/07/2019  Reason for Consultation/Follow-up: Establishing goals of care  Subjective:  patient is alert, resting in bed, has scoliosis, daughter who is visiting from [place] is now at bedside, family meeting was held for further goals of care discussions, see below.   Length of Stay: 9  Current Medications: Scheduled Meds:  . apixaban  5 mg Oral BID  . arformoterol  15 mcg Nebulization BID  . budesonide (PULMICORT) nebulizer solution  0.5 mg Nebulization BID  . chlorhexidine  15 mL Mouth Rinse BID  . Chlorhexidine Gluconate Cloth  6 each Topical Daily  . diltiazem  120 mg Oral Q6H  . escitalopram  20 mg Oral Daily  . furosemide  80 mg Intravenous BID  . mouth rinse  15 mL Mouth Rinse q12n4p  . metoprolol tartrate  10 mg Intravenous Q6H  . predniSONE  20 mg Oral Q breakfast  . rosuvastatin  5 mg Oral QODAY    Continuous Infusions:   PRN Meds: acetaminophen, ipratropium-albuterol, morphine injection, ondansetron (ZOFRAN) IV, Resource ThickenUp Clear, sodium chloride flush  Physical Exam         Frail elderly Scoliosis Shallow regular breath sounds S1 S2 No edema Abdomen not distended Awakens and interacts.   Vital Signs: BP (!) 93/50 (BP Location: Left Leg)   Pulse 72   Temp 98.1 F (36.7 C)   Resp 20   Ht 5\' 7"  (1.702 m)   Wt 68.4 kg   SpO2 95%   BMI 23.62 kg/m  SpO2: SpO2: 95 % O2 Device: O2 Device: Nasal Cannula O2 Flow Rate: O2 Flow Rate (L/min): 5 L/min  Intake/output summary:   Intake/Output Summary (Last 24 hours) at 06/16/2019  1205 Last data filed at 06/16/2019 0540 Gross per 24 hour  Intake --  Output 450 ml  Net -450 ml   LBM: Last BM Date: 06/14/19 Baseline Weight: Weight: 67 kg Most recent weight: Weight: 68.4 kg       Palliative Assessment/Data:      Patient Active Problem List   Diagnosis Date Noted  . Acute respiratory failure with hypoxia ([place])   .  Hypoxia   . Elevated d-dimer   . Chest pain of uncertain etiology   . Atrial fibrillation with rapid ventricular response ([place])   . Cardiac arrest ([place]) 06/07/2019  . Pressure injury of skin 01/22/2019  . Chronic respiratory failure with hypoxia ([place]) 01/21/2019  . Multiple closed fractures of ribs of left side   . Atrial fibrillation with RVR ([place]) 02/05/2018  . Longstanding persistent atrial fibrillation ([place]) 02/05/2018  . Major depressive disorder with single episode, in partial remission ([place]) 10/28/2017  . Dyslipidemia 07/22/2017  . Aortic valve stenosis 07/22/2017  . Pre-operative cardiovascular examination 06/08/2017  . Murmur 06/08/2017  . Other emphysema ([place]) 07/09/2016  . Right hip pain 09/14/2013  . Chronic pain 08/20/2013  . Depression 08/20/2013  . Neuropathy 08/20/2013  . HYPOKALEMIA 12/18/2008  . DYSPEPSIA 12/18/2008  . Postmenopausal status 12/18/2008  . SINUSITIS - ACUTE-NOS 11/21/2008  . COPD (chronic obstructive pulmonary disease) ([place]) 11/21/2008  . CERUMEN IMPACTION, BILATERAL 09/19/2008  . ELEVATED BP READING WITHOUT DX HYPERTENSION 03/06/2008  . HYPERTENSION 02/07/2008  . EDEMA 10/14/2007  . DOE (dyspnea on exertion) 10/14/2007  . Spinal stenosis of lumbar region 09/28/2007  . Personal history of tobacco use, presenting hazards to health 11/23/2006  . B12 deficiency 08/17/2006  . Hyperlipidemia 08/17/2006  . Anxiety state 08/17/2006  . Other idiopathic scoliosis, thoracolumbar region 08/17/2006  . OBESITY NOS 04/01/2006  . PANIC ATTACK 04/01/2006    Palliative Care Assessment & Plan   Patient  Profile:    Assessment:    Recommendations/Plan:   singular focus to maximize comfort, support patient and family, provide symptom relief, supplemental O2, PO as much as she can tolerate.   Recommend residential hospice for aggressive symptom management at end of life, discussed with daughter in detail at bedside this morning.   Prognosis likely 2 weeks. Ok to be transferred out of stepdown unit in my opinion.   Goals of Care and Additional Recommendations:  Limitations on Scope of Treatment: Full Comfort Care  Code Status:    Code Status Orders  (From admission, onward)         Start     Ordered   06/08/19 1039  Do not attempt resuscitation (DNR)  Continuous    Question Answer Comment  Maintain current active treatments Yes   Do not initiate new interventions Yes      06/08/19 1038        Code Status History    Date Active Date Inactive Code Status Order ID Comments User Context   06/07/2019 1748 06/08/2019 1038 Full Code [id number]  Juanito Doom, MD Inpatient   01/21/2019 1025 01/23/2019 1918 DNR [id number]  Ina Homes, MD ED   01/21/2019 1003 01/21/2019 1025 DNR [id number]  Ina Homes, MD ED   02/05/2018 1725 02/10/2018 1426 Full Code [id number]  Elgergawy, Silver Huguenin, MD Inpatient   Advance Care Planning Activity       Prognosis:   < 2 weeks  Discharge Planning:  Hospice facility  Care plan was discussed with  IDT  Thank you for allowing the Palliative Medicine Team to assist in the care of this patient.   Time In: 11 Time Out: 11.25 Total Time 25 Prolonged Time Billed  no       Greater than 50%  of this time was spent counseling and coordinating care related to the above assessment and plan.  Loistine Chance, MD  Please contact Palliative Medicine Team phone at [phone] for questions and concerns.

## 2019-06-16 NOTE — Progress Notes (Signed)
Engineer, maintenance [facility]) Hospital Liaison note.   Received request from [place] for family interest in [facility]. Chart reviewed and eligibility confirmed. Spoke with family to confirm interest and explain services. Family agreeable to transfer today. TOC aware.   Consents are complete, please arrange transport.  RN please call report to [phone].   Thank you,  Farrel Gordon, RN, CCM  [facility] (listed on [place] under Hospice[facility])  [phone]

## 2019-06-16 NOTE — Discharge Summary (Signed)
Physician Discharge Summary  [name] [id number] DOB: 08-Jul-1945 DOA: 06/07/2019  PCP: Ardith Dark, PA-C  Admit date: 06/07/2019 Discharge date: 06/16/2019  Admitted From: Home  Disposition:  Residential hospice   Recommendations for Outpatient Follow-up and new medication changes:  1. Follow up with  Ardith Dark PA -C in 7 days.  2. Furosemide increased to 60 mg bid. 3. Increased diltiazem to 120 mg q 6 H   Home Health: na   Equipment/Devices: na    Discharge Condition: stable  CODE STATUS: dnr   Diet recommendation: heart healthy   Brief/Interim Summary: Patient was admitted to the hospital with working diagnosis of cardiac arrest possible septic shock due to pneumonia, complicated with atrial fibrillation with rapid ventricular response and acute heart failure. Patient very debilitated, now transition to hospice.    74 year old female who was admitted April 6 after a cardiac arrest.  On the day of admission she initially presented to the [place] with nonspecific complaints including migraines, falls and hallucinations.  She was found to be hypoxemic recommended to be admitted to the hospital.  Unfortunately she left against medical advice.  Later that day she was brought to [facility] ED via EMS in cardiac arrest.  Received ACLS for less than 5 minutes,before she recovered spontaneous circulation. She was intubated and placed on mechanical ventilation. On her initial physical examination in the ICU her blood pressure was 96/62, heart rate 86, respiratory rate 19, oxygen saturation 98% on mechanical ventilation.  Physical examination she was sedated, lungs were clear to auscultation, heart S1-S2, present rhythm, soft abdomen no lower extremity edema.  Sodium 139, potassium 4.2, chloride 93, bicarb 26, glucose 144, BUN 16, creatinine 0.9, BNP 1296, lactic acid 6.9, white count 12.7, hemoglobin 14.3, hematocrit 46.5, platelets 202.  SARS COVID-19 negative,  urinalysis negative for infection.  Head CT no acute changes, positive calcified meningioma.  Chest radiograph with hypoventilation, bilateral pleural effusions, mild interstitial infiltrates.  EKG 137 bpm normal axis, atrial fibrillation rhythm, V4 through V6 ST depression, no significant T wave changes.   Further work-up with CT chest showed bibasilar patchy consolidative changes with air bronchograms, predominantly left lower lobe, nondisplaced fracture of the body of the sternum with probably bilateral rib fractures.   Patient received antibiotic therapy, amiodarone infusion and diuresis with furosemide.  She was successfully liberated from invasive mechanical ventilation on April 7, and transition to BiPAP (non invasive mechanical ventilation).  She became BiPAP dependent and on April 14 her CODE STATUS was changed to DNR. Patient with significant deconditioned, she transitioned to hospice per her family request.   1. Acute on chronic hypoxic respiratory failure due to left lower lobe pneumonia/ COPD exacerbation, complicated with cardiac arrest. Patient now has been off Bipap for about 24 H, dyspnea has improved, oxygenation is 95% on 5 L/min per [place]. Cultures wit no growth.   Patient has completed antibiotic therapy. Will continue with systemic steroids, will change to 20 mg po daily prednisone with a fairly rapid taper. Continue with bronchodilator therapy and inhaled corticosteroids.   2. Atrial fibrillation with RVR complicated with acute diastolic heart failure. Patient's volume balance negative 3,860 since admission, (per records). She has mild JVD and rales on pulmonary examination. Echocardiogram with preserved LV systolic function.   Continue with furosemide 60 mg po q12 H for diuresis, continue to target negative fluid balance, further titration as outpatient. Continue with diltiazem for rate control, 120 mg every 6 H. Anticoagulation with apixaban.  3. Swallow dysfunction.  Will continue with dysphagia 1 diet with aspiration precautions.   4. Sternal fracture. Post chest compression, continue pain control, per hospice team. Patient is on morphine at home.   5. Depression. Continue with escitalopram.    Discharge Diagnoses:  Active Problems:   Cardiac arrest (HCC)   Hypoxia   Elevated d-dimer   Chest pain of uncertain etiology   Atrial fibrillation with rapid ventricular response (HCC)   Acute respiratory failure with hypoxia [facility])    Discharge Instructions   Allergies as of 06/16/2019      Reactions   Cefadroxil Anaphylaxis, Swelling, Other (See Comments)   Throat closes   Ciprofloxacin Anaphylaxis, Other (See Comments)   Throat closes   Penicillins Anaphylaxis, Swelling   Did it involve swelling of the face/tongue/throat, SOB, or low BP? Yes Did it involve sudden or severe rash/hives, skin peeling, or any reaction on the inside of your mouth or nose? No Did you need to seek medical attention at a hospital or doctor's office? Yes When did it last happen?"10-15 years ago" If all above answers are "NO", may proceed with cephalosporin use.   Atorvastatin Other (See Comments)   Possible SAMS - 40 mg dose      Medication List    STOP taking these medications   diltiazem 120 MG 24 hr capsule Commonly known as: CARDIZEM CD     TAKE these medications   apixaban 5 MG Tabs tablet Commonly known as: ELIQUIS Take 1 tablet (5 mg total) by mouth 2 (two) times daily.   diltiazem 10 mg/ml  oral suspension Commonly known as: CARDIZEM Take 12 mLs (120 mg total) by mouth every 6 (six) hours.   escitalopram 20 MG tablet Commonly known as: LEXAPRO Take 1 tablet (20 mg total) by mouth daily.   furosemide 20 MG tablet Commonly known as: Lasix Take 3 tablets (60 mg total) by mouth 2 (two) times daily. What changed:   medication strength  how much to take  when to take this   gabapentin 300 MG capsule Commonly known as: NEURONTIN Take  300-600 mg by mouth See admin instructions. Take 300 mg by mouth in the morning and 600 mg in the evening   metoprolol tartrate 25 MG tablet Commonly known as: LOPRESSOR Take 25 mg by mouth 2 (two) times daily.   morphine 15 MG 12 hr tablet Commonly known as: MS CONTIN Take 15 mg by mouth every 12 (twelve) hours.   predniSONE 20 MG tablet Commonly known as: DELTASONE Take one tablet daily for 3 days, then take half tablet daily for 3 days, then stop.   rosuvastatin 5 MG tablet Commonly known as: CRESTOR Take 5 mg by mouth every other day.   Stiolto Respimat 2.5-2.5 MCG/ACT Aers Generic drug: Tiotropium Bromide-Olodaterol Inhale 2 puffs into the lungs daily.   Ventolin HFA 108 (90 Base) MCG/ACT inhaler Generic drug: albuterol Inhale 2 puffs into the lungs every 6 (six) hours as needed for wheezing or shortness of breath.       Allergies  Allergen Reactions  . Cefadroxil Anaphylaxis, Swelling and Other (See Comments)    Throat closes  . Ciprofloxacin Anaphylaxis and Other (See Comments)    Throat closes  . Penicillins Anaphylaxis and Swelling    Did it involve swelling of the face/tongue/throat, SOB, or low BP? Yes Did it involve sudden or severe rash/hives, skin peeling, or any reaction on the inside of your mouth or nose? No Did you need to seek  medical attention at a hospital or doctor's office? Yes When did it last happen?"10-15 years ago" If all above answers are "NO", may proceed with cephalosporin use.   . Atorvastatin Other (See Comments)    Possible SAMS - 40 mg dose    Consultations:  Cardiology   Palliative Care   Procedures/Studies: DG Chest 2 View  Result Date: 06/07/2019 CLINICAL DATA:  Frequent falls with chest pain, initial encounter EXAM: CHEST - 2 VIEW COMPARISON:  01/21/2019 FINDINGS: Cardiac shadow is enlarged but stable. Aortic calcifications are again seen. Elevation of the right hemidiaphragm is again noted with compressive atelectasis  chronic in appearance. No acute infiltrate is seen. No sizable effusion is noted. Diffuse osteopenia is seen. IMPRESSION: Elevated right hemidiaphragm with compressive atelectasis stable from the prior exam. Electronically Signed   By: Inez Catalina M.D.   On: 06/07/2019 09:24   DG Pelvis 1-2 Views  Result Date: 06/07/2019 CLINICAL DATA:  Recent falls EXAM: PELVIS - 1-2 VIEW COMPARISON:  None. FINDINGS: Pelvic ring is intact. No acute fracture or dislocation is noted. Degenerative changes of the hip joints are noted right greater than left. Degenerative changes of the lumbar spine are seen as well. No soft tissue abnormality is noted. IMPRESSION: Degenerative change without acute abnormality. Electronically Signed   By: Inez Catalina M.D.   On: 06/07/2019 09:24   CT Head Wo Contrast  Result Date: 06/07/2019 CLINICAL DATA:  Confusion and memory loss EXAM: CT HEAD WITHOUT CONTRAST TECHNIQUE: Contiguous axial images were obtained from the base of the skull through the vertex without intravenous contrast. COMPARISON:  None. FINDINGS: Brain: There is mild diffuse atrophy. There is a focal calcification arising from the dura in the superior left frontal lobe region anteriorly measuring 1.9 x 1.3 cm without surrounding edema or mass effect. This calcified meningioma is not felt to have clinical significance. No other evident mass. There is no hemorrhage, extra-axial fluid collection, or midline shift. 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 There is mild patchy periventricular small vessel disease in the centra semiovale bilaterally. No acute appearing infarct evident. Vascular: No hyperdense vessels evident. There is calcification in each carotid siphon region. Skull: The bony calvarium appears intact. Sinuses/Orbits: There is mucosal thickening in several ethmoid air cells. Other paranasal sinuses clear. Orbits appear symmetric bilaterally. Other: Mastoid air cells are clear.  IMPRESSION: Mild diffuse atrophy. Calcified meningioma anterior superior left frontal lobe measuring 1.9 x 1.3 cm without mass effect or edema. This finding is not felt to have clinical significance. 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 There is patchy periventricular small vessel disease. No acute infarct evident. No hemorrhage. Foci of arterial vascular calcification noted. Mucosal thickening noted in several ethmoid air cells. Electronically Signed   By: Lowella Grip III M.D.   On: 06/07/2019 08:59   CT ANGIO CHEST PE W OR WO CONTRAST  Result Date: 06/09/2019 CLINICAL DATA:  74 year old female with chest pain and hypoxia. History of COPD and cardiac arrest. Abdominal pain. EXAM: CT ANGIOGRAPHY CHEST CT ABDOMEN AND PELVIS WITH CONTRAST TECHNIQUE: Multidetector CT imaging of the chest was performed using the standard protocol during bolus administration of intravenous contrast. Multiplanar CT image reconstructions and MIPs were obtained to evaluate the vascular anatomy. Multidetector CT imaging of the abdomen and pelvis was performed using the standard protocol during bolus administration of intravenous contrast. CONTRAST:  24mL OMNIPAQUE IOHEXOL 350 MG/ML SOLN  COMPARISON:  Chest CT dated 01/21/2019. FINDINGS: Evaluation of this exam is limited due to respiratory motion artifact. Evaluation is also limited due to streak artifact caused by patient's arms. CTA CHEST FINDINGS Cardiovascular: Mild cardiomegaly. No pericardial effusion. There is coronary vascular calcification. There is moderate atherosclerotic calcification of the thoracic aorta. No aneurysmal dilatation or dissection. Evaluation of the pulmonary arteries is very limited due to respiratory motion artifact and suboptimal opacification of the peripheral branches. No large central pulmonary artery embolus identified. Mediastinum/Nodes: No definite hilar or mediastinal adenopathy. Evaluation  however is limited due to respiratory motion artifact and consolidative changes of the lungs. The esophagus is grossly unremarkable. No mediastinal fluid collection. Lungs/Pleura: There are bibasilar patchy consolidative changes with air bronchogram which may represent atelectasis or pneumonia. Clinical correlation is recommended. Scattered clusters of ground-glass subpleural density primarily involving the left upper lobe noted which is new since the prior CT and concerning for atypical pneumonia. There are small bilateral pleural effusions. No pneumothorax. The central airways are patent. Musculoskeletal: There is a nondisplaced fracture of the body of the sternum, new since the prior CT. Mild compression fracture of the inferior endplate of T2 also new since the prior CT. There is thoracolumbar scoliosis. Probable bilateral rib fractures. Evaluation however is very limited due to motion artifact. Review of the MIP images confirms the above findings. CT ABDOMEN and PELVIS FINDINGS No intra-abdominal free air or free fluid. Hepatobiliary: The liver is unremarkable. No intrahepatic biliary ductal dilatation. Probable gallstone. No pericholecystic fluid. Pancreas: Unremarkable. No pancreatic ductal dilatation or surrounding inflammatory changes. Spleen: Normal in size without focal abnormality. Adrenals/Urinary Tract: The adrenal glands are unremarkable. There is no hydronephrosis on either side. There is symmetric enhancement and excretion of contrast by both kidneys. The visualized ureters appear unremarkable. The urinary bladder is partially distended around a Foley catheter. Stomach/Bowel: There is sigmoid diverticulosis without active inflammatory changes. There is no bowel obstruction. Vascular/Lymphatic: Advanced aortoiliac atherosclerotic disease. The IVC is unremarkable. No portal venous gas. There is no adenopathy. Reproductive: The uterus is small, likely atrophic. No adnexal masses. Other: Diffuse  subcutaneous edema. There is a 7.4 x 2.0 cm fluid collection in the subcutaneous soft tissues of the left lateral hip adjacent to the greater trochanter of the left femur. Musculoskeletal: Osteopenia with scoliosis and degenerative changes of the spine. Bilateral hip osteoarthritis. No acute osseous pathology. Review of the MIP images confirms the above findings. IMPRESSION: 1. Very limited study due to respiratory motion artifact. No large central pulmonary artery embolus identified. 2. Bibasilar patchy consolidative changes with air bronchogram which may represent atelectasis or pneumonia. Clinical correlation is recommended. 3. Scattered clusters of ground-glass subpleural density primarily involving the left upper lobe is new since the prior CT and concerning for atypical pneumonia. Clinical correlation is recommended. 4. Small bilateral pleural effusions. 5. Nondisplaced fracture of the body of the sternum, new since the prior CT. Probable bilateral rib fractures. 6. Mild compression fracture of the inferior endplate of T2, also new since the prior CT. 7. Colonic diverticulosis. No bowel obstruction. 8. A 7.4 x 2.0 cm fluid collection in the subcutaneous soft tissues of the left lateral hip adjacent to the greater trochanter of the left femur. 9. Aortic Atherosclerosis (ICD10-I70.0). Electronically Signed   By: Anner Crete M.D.   On: 06/09/2019 17:17   CT ABDOMEN PELVIS W CONTRAST  Result Date: 06/09/2019 CLINICAL DATA:  74 year old female with chest pain and hypoxia. History of COPD and cardiac arrest. Abdominal pain. EXAM:  CT ANGIOGRAPHY CHEST CT ABDOMEN AND PELVIS WITH CONTRAST TECHNIQUE: Multidetector CT imaging of the chest was performed using the standard protocol during bolus administration of intravenous contrast. Multiplanar CT image reconstructions and MIPs were obtained to evaluate the vascular anatomy. Multidetector CT imaging of the abdomen and pelvis was performed using the standard  protocol during bolus administration of intravenous contrast. CONTRAST:  44mL OMNIPAQUE IOHEXOL 350 MG/ML SOLN COMPARISON:  Chest CT dated 01/21/2019. FINDINGS: Evaluation of this exam is limited due to respiratory motion artifact. Evaluation is also limited due to streak artifact caused by patient's arms. CTA CHEST FINDINGS Cardiovascular: Mild cardiomegaly. No pericardial effusion. There is coronary vascular calcification. There is moderate atherosclerotic calcification of the thoracic aorta. No aneurysmal dilatation or dissection. Evaluation of the pulmonary arteries is very limited due to respiratory motion artifact and suboptimal opacification of the peripheral branches. No large central pulmonary artery embolus identified. Mediastinum/Nodes: No definite hilar or mediastinal adenopathy. Evaluation however is limited due to respiratory motion artifact and consolidative changes of the lungs. The esophagus is grossly unremarkable. No mediastinal fluid collection. Lungs/Pleura: There are bibasilar patchy consolidative changes with air bronchogram which may represent atelectasis or pneumonia. Clinical correlation is recommended. Scattered clusters of ground-glass subpleural density primarily involving the left upper lobe noted which is new since the prior CT and concerning for atypical pneumonia. There are small bilateral pleural effusions. No pneumothorax. The central airways are patent. Musculoskeletal: There is a nondisplaced fracture of the body of the sternum, new since the prior CT. Mild compression fracture of the inferior endplate of T2 also new since the prior CT. There is thoracolumbar scoliosis. Probable bilateral rib fractures. Evaluation however is very limited due to motion artifact. Review of the MIP images confirms the above findings. CT ABDOMEN and PELVIS FINDINGS No intra-abdominal free air or free fluid. Hepatobiliary: The liver is unremarkable. No intrahepatic biliary ductal dilatation. Probable  gallstone. No pericholecystic fluid. Pancreas: Unremarkable. No pancreatic ductal dilatation or surrounding inflammatory changes. Spleen: Normal in size without focal abnormality. Adrenals/Urinary Tract: The adrenal glands are unremarkable. There is no hydronephrosis on either side. There is symmetric enhancement and excretion of contrast by both kidneys. The visualized ureters appear unremarkable. The urinary bladder is partially distended around a Foley catheter. Stomach/Bowel: There is sigmoid diverticulosis without active inflammatory changes. There is no bowel obstruction. Vascular/Lymphatic: Advanced aortoiliac atherosclerotic disease. The IVC is unremarkable. No portal venous gas. There is no adenopathy. Reproductive: The uterus is small, likely atrophic. No adnexal masses. Other: Diffuse subcutaneous edema. There is a 7.4 x 2.0 cm fluid collection in the subcutaneous soft tissues of the left lateral hip adjacent to the greater trochanter of the left femur. Musculoskeletal: Osteopenia with scoliosis and degenerative changes of the spine. Bilateral hip osteoarthritis. No acute osseous pathology. Review of the MIP images confirms the above findings. IMPRESSION: 1. Very limited study due to respiratory motion artifact. No large central pulmonary artery embolus identified. 2. Bibasilar patchy consolidative changes with air bronchogram which may represent atelectasis or pneumonia. Clinical correlation is recommended. 3. Scattered clusters of ground-glass subpleural density primarily involving the left upper lobe is new since the prior CT and concerning for atypical pneumonia. Clinical correlation is recommended. 4. Small bilateral pleural effusions. 5. Nondisplaced fracture of the body of the sternum, new since the prior CT. Probable bilateral rib fractures. 6. Mild compression fracture of the inferior endplate of T2, also new since the prior CT. 7. Colonic diverticulosis. No bowel obstruction. 8. A 7.4 x 2.0 cm  fluid collection in the subcutaneous soft tissues of the left lateral hip adjacent to the greater trochanter of the left femur. 9. Aortic Atherosclerosis (ICD10-I70.0). Electronically Signed   By: Anner Crete M.D.   On: 06/09/2019 17:17   DG Chest Port 1 View  Result Date: 06/15/2019 CLINICAL DATA:  Respiratory failure EXAM: PORTABLE CHEST 1 VIEW COMPARISON:  06/14/2019 FINDINGS: Numerous leads and wires project over the chest. Reverse apical lordotic positioning with the chin overlying the apices. Patient rotated left with significant spinal curvature. Grossly normal heart size. Atherosclerosis in the transverse aorta. Lucency under the right hemidiaphragm is favored to be gas within the colon and is similar to on the prior. Probable small bilateral pleural effusions. No pneumothorax. Mild interstitial edema, improved. Decreased bibasilar airspace disease. IMPRESSION: Improved mild congestive heart failure. Persistent small bilateral pleural effusions with decreased bibasilar Airspace disease, likely atelectasis. Position limitations as detailed above. Aortic Atherosclerosis (ICD10-I70.0). Electronically Signed   By: Abigail Miyamoto M.D.   On: 06/15/2019 07:33   DG Chest Port 1 View  Result Date: 06/14/2019 CLINICAL DATA:  74 year old female with history of respiratory failure. EXAM: PORTABLE CHEST 1 VIEW COMPARISON:  Chest x-ray 06/12/2019. FINDINGS: Lung volumes are low and have significantly decreased compared to the prior study. Widespread ill-defined opacities and areas of interstitial prominence now noted throughout all aspects of the lungs bilaterally. Bibasilar opacities are presumably predominantly atelectasis. Small bilateral pleural effusions (left greater than right). Cardiac silhouette is obscured. Mediastinal contours are distorted by patient positioning. Aortic atherosclerosis. IMPRESSION: 1. Markedly worsened aeration in the lungs, as above. Given the low lung volumes, interpretation is  severely limited, but this could simply reflect hypoventilatory lungs in the setting of congestive heart failure. However, clinical correlation for signs and symptoms of multilobar pneumonia is suggested. Electronically Signed   By: Vinnie Langton M.D.   On: 06/14/2019 06:48   DG Chest Port 1 View  Result Date: 06/12/2019 CLINICAL DATA:  Respiratory failure EXAM: PORTABLE CHEST 1 VIEW COMPARISON:  radiograph 06/11/2019 FINDINGS: Stable enlarged cardiac silhouette. Low lung volumes and bilateral pleural effusion. Upper lobe airspace disease is similar. No pneumothorax. IMPRESSION: 1. No interval change. 2. Cardiomegaly, bibasilar atelectasis, low lung volumes with bilateral pleural effusions. 3. Upper lobe airspace disease suggesting pulmonary edema. Electronically Signed   By: Suzy Bouchard M.D.   On: 06/12/2019 08:00   DG CHEST PORT 1 VIEW  Result Date: 06/11/2019 CLINICAL DATA:  Hypoxia. EXAM: PORTABLE CHEST 1 VIEW COMPARISON:  06/09/2019 and chest CT 06/09/2019 FINDINGS: Lungs are hypoinflated demonstrate prominent hazy appearance of the perihilar vessels with mild opacification over the lateral aspect of the left upper lobe/apex. There is opacification over the lung bases compatible with known moderate atelectasis and pleural fluid. Moderate elevation of the right hemidiaphragm with prominent air-filled subdiaphragmatic loop of colon as seen on the recent CT scan. At least 1 lower left lateral rib fracture. Cardiomediastinal silhouette and remainder of the exam is unchanged. IMPRESSION: 1. Bibasilar opacification compatible with known moderate atelectasis and effusions. Patchy airspace process over the left upper lobe likely infection. Suggestion of mild interstitial edema. 2. Interval worsening elevation right hemidiaphragm with prominent subdiaphragmatic air-filled loop of colon present. 3. At least 1 minimally displaced left lateral lower acute rib fracture. Electronically Signed   By: Marin Olp M.D.   On: 06/11/2019 08:15   DG CHEST PORT 1 VIEW  Result Date: 06/09/2019 CLINICAL DATA:  Hypoxia EXAM: PORTABLE CHEST 1 VIEW COMPARISON:  06/08/2019 FINDINGS: The  patient has been extubated. The enteric tube has been removed. Again noted are low lung volumes with bibasilar airspace disease and probable small bilateral pleural effusions. There is no pneumothorax. The heart size is stable. Aortic calcifications are noted. IMPRESSION: 1. Status post extubation. 2. Persistent low lung volumes with bibasilar atelectasis and pleural effusions, similar to prior study. Electronically Signed   By: Constance Holster M.D.   On: 06/09/2019 02:32   DG Chest Port 1 View  Result Date: 06/08/2019 CLINICAL DATA:  Respiratory failure. EXAM: PORTABLE CHEST 1 VIEW COMPARISON:  06/07/2019 FINDINGS: The endotracheal tube is 2.7 cm above the carina. The NG tube is stable. Stable cardiac enlargement, pleural effusions and bilateral lower lobe infiltrates and atelectasis. IMPRESSION: 1. Stable support apparatus. 2. Persistent effusions and bibasilar infiltrates/atelectasis. Electronically Signed   By: Marijo Sanes M.D.   On: 06/08/2019 07:23   DG Chest Portable 1 View  Result Date: 06/07/2019 CLINICAL DATA:  Intubation and orogastric tube placement. EXAM: PORTABLE CHEST 1 VIEW COMPARISON:  Radiograph earlier this day. FINDINGS: Endotracheal tube tip is 2.3 cm from the carina. Enteric tube tip is below the diaphragm in the stomach, side-port in the region of the gastroesophageal junction. Persistent low lung volumes. Overlying monitoring devices limit assessment of the right lung. Retrocardiac left basilar opacity better appreciated on the current exam. Possible small left pleural effusion. Heart appears normal in size, aortic atherosclerosis. Aortic tortuosity. Chronic atelectasis at the right lung base. No evidence of pneumothorax. Bones are under mineralized. There is scoliotic curvature of the spine. IMPRESSION: 1.  Endotracheal tube tip 2.3 cm from the carina. Enteric tube tip below the diaphragm in the stomach, side-port in the region of the gastroesophageal junction. Consider advancement of approximately 4 cm for optimal placement. 2. Persistent low lung volumes. Left basilar opacity which was not well demonstrated on prior exam, may represent atelectasis or pneumonia. Possible small left pleural effusion. 3.  Aortic Atherosclerosis (ICD10-I70.0). Electronically Signed   By: Keith Rake M.D.   On: 06/07/2019 13:59   DG Shoulder Left  Result Date: 06/07/2019 CLINICAL DATA:  Left shoulder pain following falls, initial encounter EXAM: LEFT SHOULDER - 2 VIEW COMPARISON:  None. FINDINGS: Degenerative changes of the acromioclavicular joint are seen. The humeral head is somewhat high-riding which may be related to an underlying rotator cuff injury. No dislocation or acute fracture is seen. Bony skeleton of the thorax appears within normal limits. IMPRESSION: Degenerative changes without acute abnormality. Electronically Signed   By: Inez Catalina M.D.   On: 06/07/2019 09:25   ECHOCARDIOGRAM COMPLETE  Result Date: 06/10/2019    ECHOCARDIOGRAM REPORT   Patient Name:   Joann Ford Date of Exam: 06/10/2019 Medical Rec #:  [id number]       Height:       67.0 in Accession #:    [id number]      Weight:       147.7 lb Date of Birth:  1945/03/24       BSA:          1.778 m Patient Age:    74 years        BP:           126/80 mmHg Patient Gender: F               HR:           120 bpm. Exam Location:  Inpatient Procedure: 2D Echo, Cardiac Doppler and Color Doppler Indications:    Cardiac  arrest  History:        Patient has prior history of Echocardiogram examinations, most                 recent 02/08/2018. Abnormal ECG, COPD, Aortic Valve Disease,                 Arrythmias:Cardiac Arrest, Signs/Symptoms:Murmur and Chest Pain;                 Risk Factors:Current Smoker, Hypertension and Dyslipidemia.                 Hypoxia.   Sonographer:    Roseanna Rainbow RDCS Referring Phys: [id number] [place]  Sonographer Comments: Technically difficult study due to poor echo windows. Image acquisition challenging due to uncooperative patient. Patient has spinal curvature, could not lay back. Difficult patient position. IMPRESSIONS  1. Left ventricular ejection fraction, by estimation, is 55 to 60%. The left ventricle has normal function. The left ventricle has no regional wall motion abnormalities. There is moderate concentric left ventricular hypertrophy. Left ventricular diastolic function could not be evaluated.  2. Right ventricular systolic function is normal. The right ventricular size is normal. There is moderately elevated pulmonary artery systolic pressure.  3. Left atrial size was moderately dilated.  4. Right atrial size was moderately dilated.  5. The mitral valve is normal in structure. Mild mitral valve regurgitation. No evidence of mitral stenosis.  6. Tricuspid valve regurgitation is mild to moderate.  7. The aortic valve is tricuspid. Aortic valve regurgitation is mild to moderate. Mild aortic valve stenosis.  8. The inferior vena cava is dilated in size with >50% respiratory variability, suggesting right atrial pressure of 8 mmHg. Comparison(s): No significant change from prior study. Prior images reviewed side by side. FINDINGS  Left Ventricle: Left ventricular ejection fraction, by estimation, is 55 to 60%. The left ventricle has normal function. The left ventricle has no regional wall motion abnormalities. The left ventricular internal cavity size was normal in size. There is  moderate concentric left ventricular hypertrophy. Left ventricular diastolic function could not be evaluated due to atrial fibrillation. Left ventricular diastolic function could not be evaluated. Right Ventricle: The right ventricular size is normal. No increase in right ventricular wall thickness. Right ventricular systolic function is normal. There is  moderately elevated pulmonary artery systolic pressure. The tricuspid regurgitant velocity is 3.14 m/s, and with an assumed right atrial pressure of 8 mmHg, the estimated right ventricular systolic pressure is 123XX123 mmHg. Left Atrium: Left atrial size was moderately dilated. Right Atrium: Right atrial size was moderately dilated. Pericardium: There is no evidence of pericardial effusion. Mitral Valve: The mitral valve is normal in structure. Mild mitral valve regurgitation, with centrally-directed jet. No evidence of mitral valve stenosis. Tricuspid Valve: The tricuspid valve is normal in structure. Tricuspid valve regurgitation is mild to moderate. Aortic Valve: The aortic valve is tricuspid. Aortic valve regurgitation is mild to moderate. Aortic regurgitation PHT measures 495 msec. Mild aortic stenosis is present. Aortic valve mean gradient measures 15.3 mmHg. Aortic valve peak gradient measures 28.8 mmHg. Aortic valve area, by VTI measures 1.78 cm. Pulmonic Valve: The pulmonic valve was normal in structure. Pulmonic valve regurgitation is not visualized. Aorta: The aortic root is normal in size and structure. Venous: The inferior vena cava is dilated in size with greater than 50% respiratory variability, suggesting right atrial pressure of 8 mmHg. IAS/Shunts: No atrial level shunt detected by color flow Doppler.  LEFT VENTRICLE PLAX  2D LVOT diam:     1.90 cm LV SV:         86 LV SV Index:   49 LVOT Area:     2.84 cm  LV Volumes (MOD) LV vol d, MOD A2C: 51.9 ml LV vol d, MOD A4C: 59.6 ml LV vol s, MOD A2C: 29.8 ml LV vol s, MOD A4C: 23.2 ml LV SV MOD A2C:     22.1 ml LV SV MOD A4C:     59.6 ml LV SV MOD BP:      30.6 ml IVC IVC diam: 2.51 cm LEFT ATRIUM             Index       RIGHT ATRIUM           Index LA Vol (A2C):   89.1 ml 50.12 ml/m RA Area:     21.10 cm LA Vol (A4C):   37.1 ml 20.87 ml/m RA Volume:   63.20 ml  35.55 ml/m LA Biplane Vol: 58.0 ml 32.62 ml/m  AORTIC VALVE AV Area (Vmax):    1.74 cm AV  Area (Vmean):   1.87 cm AV Area (VTI):     1.78 cm AV Vmax:           268.33 cm/s AV Vmean:          185.000 cm/s AV VTI:            0.486 m AV Peak Grad:      28.8 mmHg AV Mean Grad:      15.3 mmHg LVOT Vmax:         164.27 cm/s LVOT Vmean:        121.827 cm/s LVOT VTI:          0.304 m LVOT/AV VTI ratio: 0.63 AI PHT:            495 msec  AORTA Ao Asc diam: 3.50 cm MITRAL VALVE                TRICUSPID VALVE MV Area (PHT): 5.31 cm     TR Peak grad:   39.4 mmHg MV Decel Time: 143 msec     TR Vmax:        314.00 cm/s MV E velocity: 128.67 cm/s                             SHUNTS                             Systemic VTI:  0.30 m                             Systemic Diam: 1.90 cm Mihai Croitoru MD Electronically signed by Sanda Klein MD Signature Date/Time: 06/10/2019/11:33:24 AM    Final       Subjective: Patient is feeling better, dyspnea has improved but not yet back to baseline, no nausea or vomiting, no chest pain.   Discharge Exam: Vitals:   06/16/19 1200 06/16/19 1400  BP: (!) 97/48 (!) 98/50  Pulse: (!) 104 85  Resp: 14 16  Temp:    SpO2: 94% 92%   Vitals:   06/16/19 0903 06/16/19 1000 06/16/19 1200 06/16/19 1400  BP:  (!) 93/50 (!) 97/48 (!) 98/50  Pulse:  72 (!) 104 85  Resp:  20 14 16   Temp:  TempSrc:      SpO2: 95% 95% 94% 92%  Weight:      Height:        General: Not in pain or dyspnea, deconditioned  Neurology: Awake and alert, non focal  E ENT: positive pallor, no icterus, oral mucosa moist Cardiovascular: No JVD. S1-S2 present, rhythmic, no gallops, rubs, or murmurs. Trace lower extremity edema. Pulmonary: positive breath sounds bilaterally, decreased air movement, no wheezing, rhonchi, bilateral scattered rales. Gastrointestinal. Abdomen with no organomegaly, non tender, no rebound or guarding Skin. No rashes Musculoskeletal: no joint deformities   The results of significant diagnostics from this hospitalization (including imaging, microbiology, ancillary  and laboratory) are listed below for reference.     Microbiology: Recent Results (from the past 240 hour(s))  Culture, blood (routine x 2)     Status: None   Collection Time: 06/07/19  1:57 PM   Specimen: BLOOD  Result Value Ref Range Status   Specimen Description   Final    BLOOD LEFT ANTECUBITAL Performed at [facility] [address] [place], [place] [address]    Special Requests   Final    BOTTLES DRAWN AEROBIC AND ANAEROBIC Blood Culture adequate volume Performed at [facility] [address] [place], [place] [address]    Culture   Final    NO GROWTH 5 DAYS Performed at [facility], [place] [address], [place], [place] [address]    Report Status 06/12/2019 FINAL  Final  SARS CORONAVIRUS 2 (TAT 6-24 HRS) Nasopharyngeal Nasopharyngeal Swab     Status: None   Collection Time: 06/07/19  2:06 PM   Specimen: Nasopharyngeal Swab  Result Value Ref Range Status   SARS Coronavirus 2 NEGATIVE NEGATIVE Final    Comment: (NOTE) SARS-CoV-2 target nucleic acids are NOT DETECTED. The SARS-CoV-2 RNA is generally detectable in upper and lower respiratory specimens during the acute phase of infection. Negative results do not preclude SARS-CoV-2 infection, do not rule out co-infections with other pathogens, and should not be used as the sole basis for treatment or other patient management decisions. Negative results must be combined with clinical observations, patient history, and epidemiological information. The expected result is Negative. Fact Sheet for Patients: SugarRoll.be Fact Sheet for Healthcare Providers: https://www.woods-mathews.com/ This test is not yet approved or cleared by the [place] FDA and  has been authorized for detection and/or diagnosis of SARS-CoV-2 by FDA under an Emergency Use Authorization (EUA). This EUA will remain  in effect (meaning this test can be used) for the  duration of the COVID-19 declaration under Section 56 4(b)(1) of the Act, 21 U.S.C. section 360bbb-3(b)(1), unless the authorization is terminated or revoked sooner. Performed at [facility], [place] [address], [place], [place] [address]   Respiratory Panel by PCR     Status: None   Collection Time: 06/07/19  2:06 PM   Specimen: Nasopharyngeal Swab; Respiratory  Result Value Ref Range Status   Adenovirus NOT DETECTED NOT DETECTED Final   Coronavirus 229E NOT DETECTED NOT DETECTED Final    Comment: (NOTE) The Coronavirus on the Respiratory Panel, DOES NOT test for the novel  Coronavirus (2019 nCoV)    Coronavirus HKU1 NOT DETECTED NOT DETECTED Final   Coronavirus NL63 NOT DETECTED NOT DETECTED Final   Coronavirus OC43 NOT DETECTED NOT DETECTED Final   Metapneumovirus NOT DETECTED NOT DETECTED Final   Rhinovirus / Enterovirus NOT DETECTED NOT DETECTED Final   Influenza A NOT DETECTED NOT DETECTED Final  Influenza B NOT DETECTED NOT DETECTED Final   Parainfluenza Virus 1 NOT DETECTED NOT DETECTED Final   Parainfluenza Virus 2 NOT DETECTED NOT DETECTED Final   Parainfluenza Virus 3 NOT DETECTED NOT DETECTED Final   Parainfluenza Virus 4 NOT DETECTED NOT DETECTED Final   Respiratory Syncytial Virus NOT DETECTED NOT DETECTED Final   Bordetella pertussis NOT DETECTED NOT DETECTED Final   Chlamydophila pneumoniae NOT DETECTED NOT DETECTED Final   Mycoplasma pneumoniae NOT DETECTED NOT DETECTED Final    Comment: Performed at [facility], [place] [address] [place], [place] [address]  MRSA PCR Screening     Status: None   Collection Time: 06/07/19  5:53 PM   Specimen: Nasal Mucosa; Nasopharyngeal  Result Value Ref Range Status   MRSA by PCR NEGATIVE NEGATIVE Final    Comment:        The GeneXpert MRSA Assay (FDA approved for NASAL specimens only), is one component of a comprehensive MRSA colonization surveillance program. It is not intended to diagnose MRSA infection nor  to guide or monitor treatment for MRSA infections. Performed at [facility], [place] [address] [place], [place] [address]   Culture, blood (routine x 2)     Status: None   Collection Time: 06/07/19  7:19 PM   Specimen: BLOOD RIGHT HAND  Result Value Ref Range Status   Specimen Description   Final    BLOOD RIGHT HAND Performed at [facility] [address], [place], [place] [address]    Special Requests   Final    BOTTLES DRAWN AEROBIC ONLY Blood Culture results may not be optimal due to an inadequate volume of blood received in culture bottles Performed at [facility] [address] [place], [place] [address]    Culture   Final    NO GROWTH 5 DAYS Performed at [facility], [place] [address], [place], [place] [address]    Report Status 06/12/2019 FINAL  Final     Labs: BNP (last 3 results) Recent Labs    06/07/19 1317  BNP [id number]*   Basic Metabolic Panel: Recent Labs  Lab 06/12/19 0307 06/13/19 0257 06/14/19 0240 06/14/19 1239 06/15/19 0253  NA 134* 137 139 140 141  K 4.2 4.7 3.1* 3.6 3.5  CL 98 97* 94* 95* 96*  CO2 28 26 32 35* 34*  GLUCOSE 100* 93 120* 105* 107*  BUN 11 12 18 21 23   CREATININE 0.55 0.44 0.64 0.59 0.51  CALCIUM 8.4* 8.6* 8.5* 8.4* 8.4*  MG  --   --  1.9  --  2.0  PHOS  --   --  3.2  --  3.5   Liver Function Tests: Recent Labs  Lab 06/10/19 0304  AST 31  ALT 38  ALKPHOS 63  BILITOT 1.3*  PROT 6.5  ALBUMIN 3.1*   No results for input(s): LIPASE, AMYLASE in the last 168 hours. No results for input(s): AMMONIA in the last 168 hours. CBC: Recent Labs  Lab 06/10/19 0304 06/12/19 0307 06/13/19 0257 06/14/19 0240 06/15/19 0253  WBC 12.8* 11.7* 13.4* 11.3* 8.8  NEUTROABS 10.8*  --   --   --   --   HGB 13.2 12.4 13.0 12.7 12.1  HCT 40.8 38.2 40.9 39.7 38.2  MCV 94.4 96.7 95.1 95.2 96.5  PLT 141* 148* 140* 206 221   Cardiac Enzymes: No results for input(s): CKTOTAL,  CKMB, CKMBINDEX, TROPONINI in the last 168 hours. BNP: Invalid input(s):  POCBNP CBG: Recent Labs  Lab 06/15/19 1924 06/15/19 2339 06/16/19 0322 06/16/19 0742 06/16/19 1157  GLUCAP 175* 117* 108* 99 109*   D-Dimer No results for input(s): DDIMER in the last 72 hours. Hgb A1c No results for input(s): HGBA1C in the last 72 hours. Lipid Profile No results for input(s): CHOL, HDL, LDLCALC, TRIG, CHOLHDL, LDLDIRECT in the last 72 hours. Thyroid function studies No results for input(s): TSH, T4TOTAL, T3FREE, THYROIDAB in the last 72 hours.  Invalid input(s): FREET3 Anemia work up No results for input(s): VITAMINB12, FOLATE, FERRITIN, TIBC, IRON, RETICCTPCT in the last 72 hours. Urinalysis    Component Value Date/Time   COLORURINE YELLOW 06/07/2019 0820   APPEARANCEUR CLEAR 06/07/2019 0820   LABSPEC 1.020 06/07/2019 0820   PHURINE 5.5 06/07/2019 0820   GLUCOSEU NEGATIVE 06/07/2019 0820   HGBUR SMALL (A) 06/07/2019 0820   HGBUR negative 07/23/2009 0859   BILIRUBINUR NEGATIVE 06/07/2019 0820   BILIRUBINUR Neg 09/29/2011 1505   KETONESUR NEGATIVE 06/07/2019 0820   PROTEINUR NEGATIVE 06/07/2019 0820   UROBILINOGEN 0.2 09/29/2011 1505   UROBILINOGEN 0.2 07/23/2009 0859   NITRITE NEGATIVE 06/07/2019 0820   LEUKOCYTESUR NEGATIVE 06/07/2019 0820   Sepsis Labs Invalid input(s): PROCALCITONIN,  WBC,  LACTICIDVEN Microbiology Recent Results (from the past 240 hour(s))  Culture, blood (routine x 2)     Status: None   Collection Time: 06/07/19  1:57 PM   Specimen: BLOOD  Result Value Ref Range Status   Specimen Description   Final    BLOOD LEFT ANTECUBITAL Performed at [facility], [place] [address] [place], [place] [address]    Special Requests   Final    BOTTLES DRAWN AEROBIC AND ANAEROBIC Blood Culture adequate volume Performed at [facility] [address] [place], [place] [address]    Culture   Final    NO GROWTH 5 DAYS Performed  at [facility], [place] [address] [place],  [address]    Report Status 06/12/2019 FINAL  Final  SARS CORONAVIRUS 2 (TAT 6-24 HRS) Nasopharyngeal Nasopharyngeal Swab     Status: None   Collection Time: 06/07/19  2:06 PM   Specimen: Nasopharyngeal Swab  Result Value Ref Range Status   SARS Coronavirus 2 NEGATIVE NEGATIVE Final    Comment: (NOTE) SARS-CoV-2 target nucleic acids are NOT DETECTED. The SARS-CoV-2 RNA is generally detectable in upper and lower respiratory specimens during the acute phase of infection. Negative results do not preclude SARS-CoV-2 infection, do not rule out co-infections with other pathogens, and should not be used as the sole basis for treatment or other patient management decisions. Negative results must be combined with clinical observations, patient history, and epidemiological information. The expected result is Negative. Fact Sheet for Patients: SugarRoll.be Fact Sheet for Healthcare Providers: https://www.woods-mathews.com/ This test is not yet approved or cleared by the [place] FDA and  has been authorized for detection and/or diagnosis of SARS-CoV-2 by FDA under an Emergency Use Authorization (EUA). This EUA will remain  in effect (meaning this test can be used) for the duration of the COVID-19 declaration under Section 56 4(b)(1) of the Act, 21 U.S.C. section 360bbb-3(b)(1), unless the authorization is terminated or revoked sooner. Performed at [facility], [place] [address] [place],  [address]   Respiratory Panel by PCR     Status: None   Collection Time: 06/07/19  2:06 PM   Specimen: Nasopharyngeal Swab; Respiratory  Result Value Ref Range Status   Adenovirus NOT DETECTED  NOT DETECTED Final   Coronavirus 229E NOT DETECTED NOT DETECTED Final    Comment: (NOTE) The Coronavirus on the Respiratory Panel, DOES NOT test for the novel  Coronavirus (2019 nCoV)    Coronavirus  HKU1 NOT DETECTED NOT DETECTED Final   Coronavirus NL63 NOT DETECTED NOT DETECTED Final   Coronavirus OC43 NOT DETECTED NOT DETECTED Final   Metapneumovirus NOT DETECTED NOT DETECTED Final   Rhinovirus / Enterovirus NOT DETECTED NOT DETECTED Final   Influenza A NOT DETECTED NOT DETECTED Final   Influenza B NOT DETECTED NOT DETECTED Final   Parainfluenza Virus 1 NOT DETECTED NOT DETECTED Final   Parainfluenza Virus 2 NOT DETECTED NOT DETECTED Final   Parainfluenza Virus 3 NOT DETECTED NOT DETECTED Final   Parainfluenza Virus 4 NOT DETECTED NOT DETECTED Final   Respiratory Syncytial Virus NOT DETECTED NOT DETECTED Final   Bordetella pertussis NOT DETECTED NOT DETECTED Final   Chlamydophila pneumoniae NOT DETECTED NOT DETECTED Final   Mycoplasma pneumoniae NOT DETECTED NOT DETECTED Final    Comment: Performed at [facility], [place] [address], [place], [place] [address]  MRSA PCR Screening     Status: None   Collection Time: 06/07/19  5:53 PM   Specimen: Nasal Mucosa; Nasopharyngeal  Result Value Ref Range Status   MRSA by PCR NEGATIVE NEGATIVE Final    Comment:        The GeneXpert MRSA Assay (FDA approved for NASAL specimens only), is one component of a comprehensive MRSA colonization surveillance program. It is not intended to diagnose MRSA infection nor to guide or monitor treatment for MRSA infections. Performed at [facility], [place] [address] [place], [place] [address]   Culture, blood (routine x 2)     Status: None   Collection Time: 06/07/19  7:19 PM   Specimen: BLOOD RIGHT HAND  Result Value Ref Range Status   Specimen Description   Final    BLOOD RIGHT HAND Performed at [facility] [address] [place], [place] [address]    Special Requests   Final    BOTTLES DRAWN AEROBIC ONLY Blood Culture results may not be optimal due to an inadequate volume of blood received in culture bottles Performed at [facility] [address] [place], [place] [address]    Culture   Final    NO GROWTH 5 DAYS Performed at [facility], [place] [address], [place], [place] [address]    Report Status 06/12/2019 FINAL  Final     Time coordinating discharge: 45 minutes  SIGNED:   Tawni Millers, MD  [facility] 06/16/2019, 3:21 PM

## 2019-06-16 NOTE — TOC Progression Note (Signed)
Transition of Care [facility]) - Progression Note    Patient Details  Name: Joann Ford MRN: [id number] Date of Birth: Sep 19, 1945  [facility] CM/SW Contact  Leeroy Cha, RN Phone Number: 06/16/2019, 4:03 PM  Clinical Narrative:     ptar called for transporter at 1559. Packet with dnr sheet and dc summary given to rn.  Expected Discharge Plan: [place] Barriers to Discharge: No Barriers Identified  Expected Discharge Plan and Services Expected Discharge Plan: [place]         Expected Discharge Date: 06/16/19                                     Social Determinants of Health (SDOH) Interventions    Readmission Risk Interventions No flowsheet data found.

## 2019-07-21 ENCOUNTER — Telehealth: Payer: Self-pay | Admitting: Internal Medicine

## 2019-07-21 NOTE — Telephone Encounter (Signed)
Called patient to schedule appointment from Dr.Hilty recall list, no answer and unable to leave message, mailbox was full

## 2019-08-12 ENCOUNTER — Telehealth: Payer: Self-pay | Admitting: Internal Medicine

## 2019-08-12 NOTE — Telephone Encounter (Signed)
I attempted to contact patient on 08/12/19  to schedule follow up visit from patients recall list with Dr.Hilty. The patient didn't answer and was unable to leave message because patient voicemail was full.

## 2021-01-31 ENCOUNTER — Encounter: Payer: Self-pay | Admitting: Gastroenterology
